# Patient Record
Sex: Female | Born: 1980 | Race: White | Hispanic: No | Smoking: Former smoker
Health system: Southern US, Community
[De-identification: ages and names within clinical notes are randomized; demographics above are authoritative.]

## PROBLEM LIST (undated history)

## (undated) DIAGNOSIS — K3184 Gastroparesis: Secondary | ICD-10-CM

## (undated) DIAGNOSIS — G709 Myoneural disorder, unspecified: Secondary | ICD-10-CM

## (undated) DIAGNOSIS — F419 Anxiety disorder, unspecified: Secondary | ICD-10-CM

## (undated) DIAGNOSIS — Z800 Family history of malignant neoplasm of digestive organs: Secondary | ICD-10-CM

## (undated) DIAGNOSIS — F32A Depression, unspecified: Secondary | ICD-10-CM

## (undated) DIAGNOSIS — T7840XA Allergy, unspecified, initial encounter: Secondary | ICD-10-CM

## (undated) DIAGNOSIS — E119 Type 2 diabetes mellitus without complications: Secondary | ICD-10-CM

## (undated) DIAGNOSIS — E1140 Type 2 diabetes mellitus with diabetic neuropathy, unspecified: Secondary | ICD-10-CM

## (undated) DIAGNOSIS — D649 Anemia, unspecified: Secondary | ICD-10-CM

## (undated) DIAGNOSIS — K589 Irritable bowel syndrome without diarrhea: Secondary | ICD-10-CM

## (undated) DIAGNOSIS — IMO0002 Reserved for concepts with insufficient information to code with codable children: Secondary | ICD-10-CM

## (undated) DIAGNOSIS — G43909 Migraine, unspecified, not intractable, without status migrainosus: Secondary | ICD-10-CM

## (undated) DIAGNOSIS — D689 Coagulation defect, unspecified: Secondary | ICD-10-CM

## (undated) HISTORY — DX: Coagulation defect, unspecified: D68.9

## (undated) HISTORY — DX: Reserved for concepts with insufficient information to code with codable children: IMO0002

## (undated) HISTORY — DX: Migraine, unspecified, not intractable, without status migrainosus: G43.909

## (undated) HISTORY — DX: Depression, unspecified: F32.A

## (undated) HISTORY — PX: BREAST BIOPSY: SHX20

## (undated) HISTORY — DX: Family history of malignant neoplasm of digestive organs: Z80.0

## (undated) HISTORY — DX: Irritable bowel syndrome, unspecified: K58.9

## (undated) HISTORY — DX: Allergy, unspecified, initial encounter: T78.40XA

## (undated) HISTORY — DX: Gastroparesis: K31.84

## (undated) HISTORY — DX: Myoneural disorder, unspecified: G70.9

---
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
## 1999-12-30 ENCOUNTER — Other Ambulatory Visit: Admission: RE | Admit: 1999-12-30 | Discharge: 1999-12-30 | Payer: Self-pay | Admitting: Obstetrics & Gynecology

## 2001-01-11 ENCOUNTER — Other Ambulatory Visit: Admission: RE | Admit: 2001-01-11 | Discharge: 2001-01-11 | Payer: Self-pay | Admitting: Obstetrics & Gynecology

## 2001-03-01 ENCOUNTER — Encounter (INDEPENDENT_AMBULATORY_CARE_PROVIDER_SITE_OTHER): Payer: Self-pay | Admitting: Specialist

## 2001-03-01 ENCOUNTER — Other Ambulatory Visit: Admission: RE | Admit: 2001-03-01 | Discharge: 2001-03-01 | Payer: Self-pay | Admitting: Obstetrics & Gynecology

## 2001-06-27 ENCOUNTER — Other Ambulatory Visit: Admission: RE | Admit: 2001-06-27 | Discharge: 2001-06-27 | Payer: Self-pay | Admitting: Obstetrics & Gynecology

## 2002-02-20 ENCOUNTER — Other Ambulatory Visit: Admission: RE | Admit: 2002-02-20 | Discharge: 2002-02-20 | Payer: Self-pay | Admitting: Obstetrics & Gynecology

## 2002-08-13 ENCOUNTER — Other Ambulatory Visit: Admission: RE | Admit: 2002-08-13 | Discharge: 2002-08-13 | Payer: Self-pay | Admitting: Obstetrics & Gynecology

## 2003-06-26 ENCOUNTER — Emergency Department (HOSPITAL_COMMUNITY): Admission: EM | Admit: 2003-06-26 | Discharge: 2003-06-26 | Payer: Self-pay | Admitting: Emergency Medicine

## 2004-01-19 ENCOUNTER — Other Ambulatory Visit: Admission: RE | Admit: 2004-01-19 | Discharge: 2004-01-19 | Payer: Self-pay | Admitting: Obstetrics & Gynecology

## 2004-07-14 ENCOUNTER — Ambulatory Visit (HOSPITAL_COMMUNITY): Admission: RE | Admit: 2004-07-14 | Discharge: 2004-07-14 | Payer: Self-pay | Admitting: Family Medicine

## 2005-04-11 ENCOUNTER — Other Ambulatory Visit: Admission: RE | Admit: 2005-04-11 | Discharge: 2005-04-11 | Payer: Self-pay | Admitting: Obstetrics & Gynecology

## 2005-04-12 ENCOUNTER — Other Ambulatory Visit: Admission: RE | Admit: 2005-04-12 | Discharge: 2005-04-12 | Payer: Self-pay | Admitting: Obstetrics & Gynecology

## 2005-09-19 HISTORY — PX: CHOLECYSTECTOMY: SHX55

## 2006-01-15 ENCOUNTER — Encounter: Admission: RE | Admit: 2006-01-15 | Discharge: 2006-01-15 | Payer: Self-pay | Admitting: Specialist

## 2006-07-25 ENCOUNTER — Ambulatory Visit: Payer: Self-pay | Admitting: Gastroenterology

## 2006-08-07 ENCOUNTER — Encounter: Payer: Self-pay | Admitting: Gastroenterology

## 2006-08-07 ENCOUNTER — Ambulatory Visit: Payer: Self-pay | Admitting: Gastroenterology

## 2006-08-08 ENCOUNTER — Inpatient Hospital Stay (HOSPITAL_COMMUNITY): Admission: EM | Admit: 2006-08-08 | Discharge: 2006-08-12 | Payer: Self-pay | Admitting: Emergency Medicine

## 2006-08-11 ENCOUNTER — Encounter (INDEPENDENT_AMBULATORY_CARE_PROVIDER_SITE_OTHER): Payer: Self-pay | Admitting: Specialist

## 2006-08-18 ENCOUNTER — Ambulatory Visit: Payer: Self-pay | Admitting: Gastroenterology

## 2007-02-21 ENCOUNTER — Encounter: Admission: RE | Admit: 2007-02-21 | Discharge: 2007-02-21 | Payer: Self-pay | Admitting: Obstetrics and Gynecology

## 2008-10-02 ENCOUNTER — Ambulatory Visit: Payer: Self-pay | Admitting: Diagnostic Radiology

## 2008-10-02 ENCOUNTER — Emergency Department (HOSPITAL_BASED_OUTPATIENT_CLINIC_OR_DEPARTMENT_OTHER): Admission: EM | Admit: 2008-10-02 | Discharge: 2008-10-02 | Payer: Self-pay | Admitting: Emergency Medicine

## 2010-01-28 ENCOUNTER — Encounter (INDEPENDENT_AMBULATORY_CARE_PROVIDER_SITE_OTHER): Payer: Self-pay | Admitting: *Deleted

## 2010-03-08 ENCOUNTER — Ambulatory Visit: Payer: Self-pay | Admitting: Gastroenterology

## 2010-03-08 ENCOUNTER — Encounter (INDEPENDENT_AMBULATORY_CARE_PROVIDER_SITE_OTHER): Payer: Self-pay | Admitting: *Deleted

## 2010-03-08 DIAGNOSIS — K5900 Constipation, unspecified: Secondary | ICD-10-CM | POA: Insufficient documentation

## 2010-03-08 DIAGNOSIS — K625 Hemorrhage of anus and rectum: Secondary | ICD-10-CM

## 2010-03-08 DIAGNOSIS — F329 Major depressive disorder, single episode, unspecified: Secondary | ICD-10-CM | POA: Insufficient documentation

## 2010-03-08 HISTORY — DX: Hemorrhage of anus and rectum: K62.5

## 2010-03-09 LAB — CONVERTED CEMR LAB
ALT: 21 units/L (ref 0–35)
AST: 24 units/L (ref 0–37)
Albumin: 4 g/dL (ref 3.5–5.2)
Alkaline Phosphatase: 85 units/L (ref 39–117)
BUN: 11 mg/dL (ref 6–23)
Basophils Absolute: 0.1 10*3/uL (ref 0.0–0.1)
Basophils Relative: 0.8 % (ref 0.0–3.0)
Bilirubin, Direct: 0.1 mg/dL (ref 0.0–0.3)
CO2: 25 meq/L (ref 19–32)
CRP, High Sensitivity: 6.66 — ABNORMAL HIGH (ref 0.00–5.00)
Calcium: 9.6 mg/dL (ref 8.4–10.5)
Chloride: 109 meq/L (ref 96–112)
Creatinine, Ser: 0.7 mg/dL (ref 0.4–1.2)
Eosinophils Absolute: 0.1 10*3/uL (ref 0.0–0.7)
Eosinophils Relative: 0.7 % (ref 0.0–5.0)
Ferritin: 55.7 ng/mL (ref 10.0–291.0)
Folate: 5 ng/mL
GFR calc non Af Amer: 104.14 mL/min (ref >60)
Glucose, Bld: 175 mg/dL — ABNORMAL HIGH (ref 70–99)
HCT: 38.9 % (ref 36.0–46.0)
Hemoglobin: 13.2 g/dL (ref 12.0–15.0)
Iron: 58 ug/dL (ref 42–145)
Lymphocytes Relative: 33.8 % (ref 12.0–46.0)
Lymphs Abs: 2.4 10*3/uL (ref 0.7–4.0)
MCHC: 33.9 g/dL (ref 30.0–36.0)
MCV: 86.5 fL (ref 78.0–100.0)
Monocytes Absolute: 0.5 10*3/uL (ref 0.1–1.0)
Monocytes Relative: 6.5 % (ref 3.0–12.0)
Neutro Abs: 4.1 10*3/uL (ref 1.4–7.7)
Neutrophils Relative %: 58.2 % (ref 43.0–77.0)
Platelets: 324 10*3/uL (ref 150.0–400.0)
Potassium: 3.8 meq/L (ref 3.5–5.1)
RBC: 4.49 M/uL (ref 3.87–5.11)
RDW: 12.9 % (ref 11.5–14.6)
Saturation Ratios: 13.7 % — ABNORMAL LOW (ref 20.0–50.0)
Sed Rate: 24 mm/hr — ABNORMAL HIGH (ref 0–22)
Sodium: 142 meq/L (ref 135–145)
TSH: 1.92 microintl units/mL (ref 0.35–5.50)
Total Bilirubin: 0.8 mg/dL (ref 0.3–1.2)
Total Protein: 7.6 g/dL (ref 6.0–8.3)
Transferrin: 302.7 mg/dL (ref 212.0–360.0)
Vitamin B-12: 389 pg/mL (ref 211–911)
WBC: 7.1 10*3/uL (ref 4.5–10.5)

## 2010-03-24 ENCOUNTER — Ambulatory Visit: Payer: Self-pay | Admitting: Gastroenterology

## 2010-04-09 ENCOUNTER — Ambulatory Visit: Payer: Self-pay | Admitting: Gastroenterology

## 2010-04-09 DIAGNOSIS — R1032 Left lower quadrant pain: Secondary | ICD-10-CM | POA: Insufficient documentation

## 2010-04-09 HISTORY — DX: Left lower quadrant pain: R10.32

## 2010-04-09 LAB — CONVERTED CEMR LAB
Amylase: 19 units/L — ABNORMAL LOW (ref 27–131)
Basophils Absolute: 0 10*3/uL (ref 0.0–0.1)
Basophils Relative: 0.6 % (ref 0.0–3.0)
Eosinophils Absolute: 0.1 10*3/uL (ref 0.0–0.7)
Eosinophils Relative: 0.7 % (ref 0.0–5.0)
HCT: 40.6 % (ref 36.0–46.0)
Hemoglobin: 14.1 g/dL (ref 12.0–15.0)
IgA: 196 mg/dL (ref 68–378)
Lipase: 21 units/L (ref 11.0–59.0)
Lymphocytes Relative: 30.4 % (ref 12.0–46.0)
Lymphs Abs: 2.4 10*3/uL (ref 0.7–4.0)
MCHC: 34.6 g/dL (ref 30.0–36.0)
MCV: 86.6 fL (ref 78.0–100.0)
Monocytes Absolute: 0.5 10*3/uL (ref 0.1–1.0)
Monocytes Relative: 6.8 % (ref 3.0–12.0)
Neutro Abs: 4.8 10*3/uL (ref 1.4–7.7)
Neutrophils Relative %: 61.5 % (ref 43.0–77.0)
Platelets: 352 10*3/uL (ref 150.0–400.0)
RBC: 4.69 M/uL (ref 3.87–5.11)
RDW: 13.1 % (ref 11.5–14.6)
T3, Free: 3.2 pg/mL (ref 2.3–4.2)
T4, Total: 5.6 ug/dL (ref 5.0–12.5)
TSH: 1.25 microintl units/mL (ref 0.35–5.50)
Tissue Transglutaminase Ab, IgA: 12.3 units (ref <20)
WBC: 7.9 10*3/uL (ref 4.5–10.5)

## 2010-04-22 ENCOUNTER — Ambulatory Visit: Payer: Self-pay | Admitting: Cardiovascular Disease

## 2010-04-30 ENCOUNTER — Telehealth: Payer: Self-pay | Admitting: Gastroenterology

## 2010-05-07 ENCOUNTER — Ambulatory Visit: Payer: Self-pay | Admitting: Gastroenterology

## 2010-05-07 DIAGNOSIS — K219 Gastro-esophageal reflux disease without esophagitis: Secondary | ICD-10-CM | POA: Insufficient documentation

## 2010-06-04 ENCOUNTER — Encounter: Payer: Self-pay | Admitting: Gastroenterology

## 2010-08-25 ENCOUNTER — Other Ambulatory Visit
Admission: RE | Admit: 2010-08-25 | Discharge: 2010-08-25 | Payer: Self-pay | Source: Home / Self Care | Admitting: Obstetrics and Gynecology

## 2010-10-01 ENCOUNTER — Inpatient Hospital Stay (HOSPITAL_COMMUNITY)
Admission: AD | Admit: 2010-10-01 | Discharge: 2010-10-01 | Payer: Self-pay | Source: Home / Self Care | Attending: Obstetrics & Gynecology | Admitting: Obstetrics & Gynecology

## 2010-10-04 LAB — URINALYSIS, ROUTINE W REFLEX MICROSCOPIC
Bilirubin Urine: NEGATIVE
Hgb urine dipstick: NEGATIVE
Ketones, ur: NEGATIVE mg/dL
Nitrite: NEGATIVE
Protein, ur: NEGATIVE mg/dL
Specific Gravity, Urine: >1.03 — ABNORMAL HIGH (ref 1.005–1.030)
Urine Glucose, Fasting: NEGATIVE mg/dL
Urobilinogen, UA: 1 mg/dL (ref 0.0–1.0)
pH: 6 (ref 5.0–8.0)

## 2010-10-10 ENCOUNTER — Encounter: Payer: Self-pay | Admitting: Gastroenterology

## 2010-10-19 NOTE — Progress Notes (Signed)
Summary: rx and ote  Phone Note Call from Patient   Caller: Patient Call For: Dr. Jarold Motto Reason for Call: Talk to Nurse Summary of Call: 1. needs Tramadol rx called into [facility] in [place] 2. needs office note for school explaining recent absences Initial call taken by: Vallarie Mare,  April 30, 2010 9:56 AM  Follow-up for Phone Call        Lm for pt to call.  Lupita Leash Surface RN  April 30, 2010 10:41 AM  No answer pt's home number.   Rx sent for tramaldol as requested Note printed.   Lupita Leash Surface RN  April 30, 2010 2:27 PM  talked with pt.  SHe needs note to be faxed.  She will have to call back with fax number. Follow-up by: Ashok Cordia RN,  April 30, 2010 2:34 PM  Additional Follow-up for Phone Call Additional follow up Details #1::        Fax # [phone].  Faxed as requested.  Pt notified. Additional Follow-up by: Ashok Cordia RN,  April 30, 2010 4:32 PM    Prescriptions: TRAMADOL HCL 50 MG TABS (TRAMADOL HCL) 1 by mouth q 6-8 hrs as needed pain  #60 x 1   Entered by:   Ashok Cordia RN   Authorized by:   Mardella Layman MD [facility]   Signed by:   Ashok Cordia RN on 04/30/2010   Method used:   Electronically to        [facility] [phone]* (retail)       [address] [place], [place]  [address]       Ph: [phone] or [phone]       Fax: [phone]   RxID:   [id number]

## 2010-10-19 NOTE — Assessment & Plan Note (Signed)
Summary: F/U APPT...LSW.   History of Present Illness Visit Type: Follow-up Visit Primary GI MD: Sheryn Bison MD FACP FAGA Primary Provider: Joette Catching, MD Requesting Provider: na Chief Complaint: F/u constipation, rectal bleeding and left side abd pain. Pt states that she is feeling better and the Amitiza is helping but she is having GERD symptoms.  History of Present Illness:   [name] is improved in terms of her constipation and abdominal pain, she is on b.i.d. Amitiza 24 micrograms and also p.r.n. Levsin. CT Scan labs were reviewed and are all normal.   GI Review of Systems    Reports acid reflux and  heartburn.      Denies abdominal pain, belching, bloating, chest pain, dysphagia with liquids, dysphagia with solids, loss of appetite, nausea, vomiting, vomiting blood, weight loss, and  weight gain.        Denies anal fissure, black tarry stools, change in bowel habit, constipation, diarrhea, diverticulosis, fecal incontinence, heme positive stool, hemorrhoids, irritable bowel syndrome, jaundice, light color stool, liver problems, rectal bleeding, and  rectal pain.    Current Medications (verified): 1)  Zoloft 50 Mg Tabs (Sertraline Hcl) .... Take 1 Tablet By Mouth Once Daily 2)  Hyomax-Dt 0.375 Mg Cr-Tabs (Hyoscyamine Sulfate) .... Take 1 Talbet By Mouth Two Times A Day As Needed 3)  Canasa 1000 Mg  Supp (Mesalamine) .Marland Kitchen.. 1 Q Hs 4)  Analpram-Hc 1-1 %  Crea (Hydrocortisone Ace-Pramoxine) .... Use As Needed 5)  Amitiza 24 Mcg Caps (Lubiprostone) .Marland Kitchen.. 1 By Mouth Two Times A Day 6)  Tandem 162-115.2 Mg Caps (Ferrous Fum-Iron Polysacch) .Marland Kitchen.. 1 By Mouth Qd 7)  Folic Acid 1 Mg Tabs (Folic Acid) .Marland Kitchen.. 1 By Mouth Qd 8)  Tramadol Hcl 50 Mg Tabs (Tramadol Hcl) .Marland Kitchen.. 1 By Mouth Q 6-8 Hrs As Needed Pain 9)  Miralax  Powd (Polyethylene Glycol 3350) .... Take As Directed At Bedtime.  Allergies (verified): 1)  ! Sulfa 2)  ! Celebrex  Past History:  Past medical, surgical, family and  social histories (including risk factors) reviewed for relevance to current acute and chronic problems.  Past Medical History: Reviewed history from 03/08/2010 and no changes required. Asthma Kidney Stones Depression  Past Surgical History: Reviewed history from 03/08/2010 and no changes required. Tonsillectomy Cholecystectomy  Family History: Reviewed history from 03/08/2010 and no changes required. Family History of Colon Cancer: grandparents Family History of Irritable Bowel Syndrome:GM  Social History: Reviewed history from 03/08/2010 and no changes required. Occupation: [employer] Patient currently smokes.  Alcohol Use - no Daily Caffeine Use Illicit Drug Use - no Patient gets regular exercise.  Review of Systems  The patient denies allergy/sinus, anemia, anxiety-new, arthritis/joint pain, back pain, blood in urine, breast changes/lumps, change in vision, confusion, cough, coughing up blood, depression-new, fainting, fatigue, fever, headaches-new, hearing problems, heart murmur, heart rhythm changes, itching, menstrual pain, muscle pains/cramps, night sweats, nosebleeds, pregnancy symptoms, shortness of breath, skin rash, sleeping problems, sore throat, swelling of feet/legs, swollen lymph glands, thirst - excessive , urination - excessive , urination changes/pain, urine leakage, vision changes, and voice change.    Vital Signs:  Patient profile:   30 year old female Height:      62 inches Weight:      155 pounds BMI:     28.45 BSA:     1.72 Pulse rate:   68 / minute Pulse rhythm:   regular BP sitting:   120 / 74  (left arm) Cuff size:   regular  Vitals  Entered By: Ok Anis CMA (May 07, 2010 9:44 AM)  Physical Exam  General:  Well developed, well nourished, no acute distress.healthy appearing.  healthy appearing.   Head:  Normocephalic and atraumatic. Eyes:  PERRLA, no icterus.exam deferred to patient's ophthalmologist.  exam deferred to patient's  ophthalmologist.   Abdomen:  Soft, nontender and nondistended. No masses, hepatosplenomegaly or hernias noted. Normal bowel sounds.Minimal tenderness over mobile sigmoid colon in the left lower quadrant. Extremities:  No clubbing, cyanosis, edema or deformities noted. Neurologic:  Alert and  oriented x4;  grossly normal neurologically. Psych:  Alert and cooperative. Normal mood and affect.   Impression & Recommendations:  Problem # 1:  ABDOMINAL PAIN, LEFT LOWER QUADRANT (ICD-789.04) Assessment Improved Constipation predominant IBS doing better on therapy. Continue meds as listed above.  Problem # 2:  GERD (ICD-530.81) Assessment: Deteriorated New onset typical GERD probably related to constipation and anticholinergic medication use. I placed her on AcipHex 20 mg a day along with standard antireflux maneuvers.  Patient Instructions: 1)  Please continue current medications.  2)  Begin Aciphex once daily.  Samples given. 3)  Please schedule a follow-up appointment as needed.  4)  The medication list was reviewed and reconciled.  All changed / newly prescribed medications were explained.  A complete medication list was provided to the patient / caregiver. 5)  Copy sent to : Dr. Joette Catching. 6)  Please continue current medications.  7)  Avoid foods high in acid content ( tomatoes, citrus juices, spicy foods) . Avoid eating within 3 to 4 hours of lying down or before exercising. Do not over eat; try smaller more frequent meals. Elevate head of bed four inches when sleeping.  8)  High Fiber, Low Fat  Healthy Eating Plan brochure given.   Appended Document: F/U APPT...LSW.    Clinical Lists Changes  Medications: Added new medication of ACIPHEX 20 MG  TBEC (RABEPRAZOLE SODIUM) Take 1 each day 30 minutes before meals - Signed Rx of ACIPHEX 20 MG  TBEC (RABEPRAZOLE SODIUM) Take 1 each day 30 minutes before meals;  #30 x 6;  Signed;  Entered by: Ashok Cordia RN;  Authorized by: Mardella Layman MD [facility];  Method used: Electronically to [facility] [phone]*, [address] [place], [place], [place]  [address], Ph: [phone] or [phone], Fax: [phone]    Prescriptions: ACIPHEX 20 MG  TBEC (RABEPRAZOLE SODIUM) Take 1 each day 30 minutes before meals  #30 x 6   Entered by:   Ashok Cordia RN   Authorized by:   Mardella Layman MD [facility]   Signed by:   Ashok Cordia RN on 05/07/2010   Method used:   Electronically to        [facility] [phone]* (retail)       [address] [place], [place]  [address]       Ph: [phone] or [phone]       Fax: [phone]   RxID:   [id number]

## 2010-10-19 NOTE — Procedures (Signed)
Summary: EGD   EGD  Procedure date:  08/07/2006  Findings:      Location: [facility]   Patient Name: [name], [name] MRN:  Procedure Procedures: Panendoscopy (EGD) CPT: 43235.  Personnel: Endoscopist: Vania Rea. Jarold Motto, MD.  Exam Location: Exam performed in Outpatient Clinic. Outpatient  Patient Consent: Procedure, Alternatives, Risks and Benefits discussed, consent obtained, from patient. Consent was obtained by the RN.  Indications Symptoms: Early satiety. Nausea. Dyspepsia, location: epigastric. Reflux symptoms  Comments: Prior Prevpac Rx. and now on PPI Rx. History  Current Medications: Patient is not currently taking Coumadin.  Pre-Exam Physical: Performed Aug 07, 2006  Cardio-pulmonary exam, Abdominal exam, Extremity exam, Mental status exam WNL.  Comments: Pt. history reviewed/updated, physical exam performed prior to initiation of sedation?yes Exam Exam Info: Maximum depth of insertion Duodenum, intended Duodenum. Patient position: on left side. Duration of exam: 10 minutes. Vocal cords visualized. Gastric retroflexion performed. Images taken. ASA Classification: I. Tolerance: excellent.  Sedation Meds: Patient assessed and found to be appropriate for moderate (conscious) sedation. Cetacaine Spray 2 sprays given aerosolized. Versed 3 mg. given IV.  Monitoring: BP and pulse monitoring done. Oximetry used. Supplemental O2 given at 2 Liters.  Instrument(s): GIF 160. Serial [id number].   Findings - Normal: Proximal Esophagus to Distal Esophagus. Not Seen: Tumor. Barrett's esophagus. Esophageal inflammation. Mucosal abnormality. Foreign body. Stricture. Varices.  - Normal: Cardia to Antrum. Tumor. Ulcer. Mucosal abnormality. AVM's. Foreign body. Polyp. Varices. Biopsy/Normal taken. Comments: CLO Bx. done...Marland Kitchen.  - Normal: Duodenal Bulb to Duodenal 2nd Portion. Not Seen: Ulcer. Mucosal abnormality. AVM's. Foreign body. Polyp.  Biopsy/Normal taken. Comments: R/O celiac disease.Marland KitchenMarland KitchenMarland KitchenProminant papilla noted....   Assessment Normal examination.  Comments: Symptoms c/w GERD...??? element of gastroparesis and a diffuse Gut motility disorder.... Events  Unplanned Intervention: No unplanned interventions were required.  Plans Medication(s): PPI: Lansoprazole/Prevacid 30 mg QAM, starting Aug 07, 2006 for indefinitely.   Disposition: After procedure patient sent to recovery. After recovery patient sent home.  Scheduling: Await pathology to schedule patient. Gastric Emptying Study, to [facility]. Jarold Motto, MD, around Aug 14, 2006.        SP Surgical Pathology - STATUS: Final                                            Perform Date: 19Nov07 00:01  Ordered By: Juanetta Beets        Ordered Date: 16XWR60 45:40  Facility: [facility]                               Department: CPATH  Service Report Text  [facility]   [address] [address]   [place], [place] [address]   Telephone [phone] or [phone] Fax [phone]    REPORT OF SURGICAL PATHOLOGY    Case #: [id number]   Patient Name: [name], [name].   Office Chart Number: [id number]    MRN: [id number]   Pathologist: Sonia Side, MD   DOB/Age 12/20/1980 (Age: 29) Gender: F   Date Taken: 08/07/2006   Date Received: 08/07/2006    FINAL DIAGNOSIS    ***MICROSCOPIC EXAMINATION AND DIAGNOSIS***    SMALL INTESTINE, DUODENUM, BIOPSY: BENIGN SMALL BOWEL AND   DUODENAL MUCOSA. NO ACTIVE INFLAMMATION, GRANULOMAS, PARASITES,   OR VILLOUS ATROPHY IDENTIFIED.  mj   Date Reported: 08/08/2006 Sonia Side, MD   *** Electronically Signed Out By LI ***    Clinical information   R/O celiac disease (jes)    specimen(s) obtained   Small intestine, biopsy    Gross Description   Received in formalin are tan, soft tissue fragments that are   submitted in toto. Number: multiple.   Size: 0.4 to 0.6 cm One block (ML:jes,  08/08/06)    jes/    This report was created from the original endoscopy report, which was reviewed and signed by the above listed endoscopist.

## 2010-10-19 NOTE — Letter (Signed)
Summary: [facility] Instructions  [facility]  [address] [place], [place] [address]   Phone: [phone]  Fax: [phone]       [name]    11-24-1980    MRN: [id number]        Procedure Day Dorna Bloom: Wednesday, 03/24/10     Arrival Time: 10:00       Procedure Time: 11:00     Location of Procedure:                    _X _  [facility] (4th Floor)                        PREPARATION FOR COLONOSCOPY WITH MOVIPREP   Starting 5 days prior to your procedure 03/19/10 do not eat nuts, seeds, popcorn, corn, beans, peas,  salads, or any raw vegetables.  Do not take any fiber supplements (e.g. Metamucil, Citrucel, and Benefiber).  THE DAY BEFORE YOUR PROCEDURE         DATE: 03/23/10   DAY: Tuesday  1.  Drink clear liquids the entire day-NO SOLID FOOD  2.  Do not drink anything colored red or purple.  Avoid juices with pulp.  No orange juice.  3.  Drink at least 64 oz. (8 glasses) of fluid/clear liquids during the day to prevent dehydration and help the prep work efficiently.  CLEAR LIQUIDS INCLUDE: Water Jello Ice Popsicles Tea (sugar ok, no milk/cream) Powdered fruit flavored drinks Coffee (sugar ok, no milk/cream) Gatorade Juice: apple, white grape, white cranberry  Lemonade Clear bullion, consomm, broth Carbonated beverages (any kind) Strained chicken noodle soup Hard Candy                             4.  In the morning, mix first dose of MoviPrep solution:    Empty 1 Pouch A and 1 Pouch B into the disposable container    Add lukewarm drinking water to the top line of the container. Mix to dissolve    Refrigerate (mixed solution should be used within 24 hrs)  5.  Begin drinking the prep at 5:00 p.m. The MoviPrep container is divided by 4 marks.   Every 15 minutes drink the solution down to the next mark (approximately 8 oz) until the full liter is complete.   6.  Follow completed prep with 16 oz of clear liquid of your choice (Nothing  red or purple).  Continue to drink clear liquids until bedtime.  7.  Before going to bed, mix second dose of MoviPrep solution:    Empty 1 Pouch A and 1 Pouch B into the disposable container    Add lukewarm drinking water to the top line of the container. Mix to dissolve    Refrigerate  THE DAY OF YOUR PROCEDURE      DATE: 03/24/10    DAY: Wednesday  Beginning at 6:00 a.m. (5 hours before procedure):         1. Every 15 minutes, drink the solution down to the next mark (approx 8 oz) until the full liter is complete.  2. Follow completed prep with 16 oz. of clear liquid of your choice.    3. You may drink clear liquids until 9:00  (2 HOURS BEFORE PROCEDURE).   MEDICATION INSTRUCTIONS  Unless otherwise instructed, you should take regular prescription medications with a small sip of water   as early as  possible the morning of your procedure.                OTHER INSTRUCTIONS  You will need a responsible adult at least 31 years of age to accompany you and drive you home.   This person must remain in the waiting room during your procedure.  Wear loose fitting clothing that is easily removed.  Leave jewelry and other valuables at home.  However, you may wish to bring a book to read or  an iPod/MP3 player to listen to music as you wait for your procedure to start.  Remove all body piercing jewelry and leave at home.  Total time from sign-in until discharge is approximately 2-3 hours.  You should go home directly after your procedure and rest.  You can resume normal activities the  day after your procedure.  The day of your procedure you should not:   Drive   Make legal decisions   Operate machinery   Drink alcohol   Return to work  You will receive specific instructions about eating, activities and medications before you leave.    The above instructions have been reviewed and explained to me by   _______________________    I fully understand and can  verbalize these instructions _____________________________ Date _________

## 2010-10-19 NOTE — Procedures (Signed)
Summary: Colon   Colonoscopy  Procedure date:  08/04/2006  Findings:      Location:  [facility].   Patient Name: [name], [name] MRN:  Procedure Procedures: Colonoscopy CPT: 2893919622.  Personnel: Endoscopist: Vania Rea. Jarold Motto, MD.  Exam Location: Exam performed in Outpatient Clinic. Outpatient  Patient Consent: Procedure, Alternatives, Risks and Benefits discussed, consent obtained, from patient. Consent was obtained by the RN.  Indications Symptoms: Constipation Patient's stools are infrequent. Patient has small caliber stools. Patient has difficulty evacuating, strains with stool passage. Abdominal pain / bloating.  History  Current Medications: Patient is not currently taking Coumadin.  Pre-Exam Physical: Performed Aug 07, 2006. Cardio-pulmonary exam, Rectal exam, Abdominal exam, Extremity exam, Mental status exam WNL.  Comments: Pt. history reviewed/updated, physical exam performed prior to initiation of sedation? Exam Exam: Extent of exam reached: Terminal Ileum, extent intended: Terminal Ileum.  The cecum was identified by appendiceal orifice and IC valve. Patient position: on left side. Time to Cecum: 00:04:57. Time for Withdrawl: 00:04:30. Colon retroflexion performed. Images taken. ASA Classification: I. Tolerance: excellent.  Monitoring: Pulse and BP monitoring, Oximetry used. Supplemental O2 given. at 2 Liters.  Colon Prep Used Golytely for colon prep. Prep results: fair, adequate exam.  Sedation Meds: Patient assessed and found to be appropriate for moderate (conscious) sedation. Fentanyl 100 mcg. given IV. Versed 9 mg. given IV.  Instrument(s): CF 140L. Serial [id number].  Findings - NORMAL EXAM: Ileum. Not Seen: AVM's. Tumors. Crohn's.  - NORMAL EXAM: Cecum to Rectum. Polyps. AVM's. Colitis. Tumors. Melanosis. Crohn's. Diverticulosis. Hemorrhoids.  - NORMAL EXAM: Sigmoid Colon to Rectum.   Assessment Normal examination.    Events  Unplanned Interventions: No intervention was required.  Plans Medication Plan: Continue current medications. Antibiotics: Amitizia  BID, starting Aug 07, 2006 for indefinitely.   Patient Education: Patient given standard instructions for: Constipation. Disposition: After procedure patient sent to recovery. After recovery patient sent home.  Scheduling/Referral: EGD, to [facility]. Jarold Motto, MD, on Aug 07, 2006.    This report was created from the original endoscopy report, which was reviewed and signed by the above listed endoscopist.

## 2010-10-19 NOTE — Procedures (Signed)
Summary: Colonoscopy  Patient: [name] Note: All result statuses are Final unless otherwise noted.  Tests: (1) Colonoscopy (COL)   COL Colonoscopy           DONE      [facility]     [address]     [place], [place]  [address]           COLONOSCOPY PROCEDURE REPORT           PATIENT:  [name], [name]  MR#:  [id number]     BIRTHDATE:  [birth year], 29 yrs. old  GENDER:  female     ENDOSCOPIST:  Vania Rea. Jarold Motto, MD, [facility]     REF. BY:     PROCEDURE DATE:  03/24/2010     PROCEDURE:  Diagnostic Colonoscopy     ASA CLASS:  Class II     INDICATIONS:  rectal bleeding     MEDICATIONS:   Fentanyl 100 mcg IV, Versed 10 mg IV           DESCRIPTION OF PROCEDURE:   After the risks benefits and     alternatives of the procedure were thoroughly explained, informed     consent was obtained.  Digital rectal exam was performed and     revealed no abnormalities.   The LB PCF-H180AL [id number] endoscope     was introduced through the anus and advanced to the cecum, which     was identified by both the appendix and ileocecal valve, without     limitations.  The quality of the prep was excellent, using     MoviPrep.  The instrument was then slowly withdrawn as the colon     was fully examined.     <<PROCEDUREIMAGES>>           FINDINGS:  No polyps or cancers were seen.  no active bleeding or     blood in c.  This was otherwise a normal examination of the colon.     NO PROCTITIS NOTED.   Retroflexed views in the rectum revealed no     abnormalities.    The scope was then withdrawn from the patient     and the procedure completed.           COMPLICATIONS:  None     ENDOSCOPIC IMPRESSION:     1) No polyps or cancers     2) No active bleeding or blood in c     3) Otherwise normal examination     CHRONIC SEVERE CONSTIPATION.     RECOMMENDATIONS:     1) metamucil or benefiber     2) Out patient follow-up in 2 weeks.     CONTINUE MEDS     REPEAT EXAM:  No        ______________________________     Vania Rea. Jarold Motto, MD, [facility]           CC:  Joette Catching, MD           n.     Rosalie Doctor:   Vania Rea. Kehaulani Fruin at 03/24/2010 11:53 AM           Wilson Singer, [phone]  Note: An exclamation mark (!) indicates a result that was not dispersed into the flowsheet. Document Creation Date: 03/24/2010 11:55 AM _______________________________________________________________________  (1) Order result status: Final Collection or observation date-time: 03/24/2010 11:44 Requested date-time:  Receipt date-time:  Reported date-time:  Referring Physician:   Ordering Physician: Sheryn Bison [phone]) Specimen Source:  Source: Launa Grill  Order Number: [id number] Lab site:

## 2010-10-19 NOTE — Assessment & Plan Note (Signed)
Summary: 2 WEEKS F.U AFTER COL.Marland KitchenMarland KitchenEM   History of Present Illness Visit Type: Follow-up Visit Primary GI MD: Sheryn Bison MD FACP FAGA Primary Provider: Joette Catching, MD Chief Complaint: constipation, rectal bleed, left sided pain x2 weeks History of Present Illness:   Continued attacks of left lower quadrant spasmodic type pain with worsening constipation despite initial response to Amitiza 8 micrograms twice a day. She continued with constipation gas and bloating and occasional bleeding with stool straining. Colonoscopy and labs were all unremarkable. She did have some slight iron deficiency and is on tandem, and uses p.r.n. Levsin without improvement. Other medications include Zoloft and folic acid.   GI Review of Systems    Reports abdominal pain and  bloating.     Location of  Abdominal pain: left side.    Denies acid reflux, belching, chest pain, dysphagia with liquids, dysphagia with solids, heartburn, loss of appetite, nausea, vomiting, vomiting blood, weight loss, and  weight gain.      Reports constipation and  rectal bleeding.     Denies anal fissure, black tarry stools, change in bowel habit, diarrhea, diverticulosis, fecal incontinence, heme positive stool, hemorrhoids, irritable bowel syndrome, jaundice, light color stool, liver problems, and  rectal pain.    Current Medications (verified): 1)  Zoloft 50 Mg Tabs (Sertraline Hcl) .... Take 1 Tablet By Mouth Once Daily 2)  Hyomax-Dt 0.375 Mg Cr-Tabs (Hyoscyamine Sulfate) .... Take 1 Talbet By Mouth Two Times A Day As Needed 3)  Canasa 1000 Mg  Supp (Mesalamine) .Marland Kitchen.. 1 Q Hs 4)  Analpram-Hc 1-1 %  Crea (Hydrocortisone Ace-Pramoxine) .... Use As Needed 5)  Amitiza 8 Mcg  Caps (Lubiprostone) .Marland Kitchen.. 1 Two Times A Day/take With Food and Water 6)  Tandem 162-115.2 Mg Caps (Ferrous Fum-Iron Polysacch) .Marland Kitchen.. 1 By Mouth Qd 7)  Folic Acid 1 Mg Tabs (Folic Acid) .Marland Kitchen.. 1 By Mouth Qd  Allergies (verified): 1)  ! Sulfa 2)  !  Celebrex  Past History:  Past medical, surgical, family and social histories (including risk factors) reviewed for relevance to current acute and chronic problems.  Past Medical History: Reviewed history from 03/08/2010 and no changes required. Asthma Kidney Stones Depression  Past Surgical History: Reviewed history from 03/08/2010 and no changes required. Tonsillectomy Cholecystectomy  Family History: Reviewed history from 03/08/2010 and no changes required. Family History of Colon Cancer: grandparents Family History of Irritable Bowel Syndrome:GM  Social History: Reviewed history from 03/08/2010 and no changes required. Occupation: [employer] Patient currently smokes.  Alcohol Use - no Daily Caffeine Use Illicit Drug Use - no Patient gets regular exercise.  Review of Systems       The patient complains of allergy/sinus, anxiety-new, cough, menstrual pain, and swelling of feet/legs.  The patient denies anemia, arthritis/joint pain, back pain, blood in urine, breast changes/lumps, change in vision, confusion, coughing up blood, depression-new, fainting, fatigue, fever, headaches-new, hearing problems, heart murmur, heart rhythm changes, itching, muscle pains/cramps, night sweats, nosebleeds, pregnancy symptoms, shortness of breath, skin rash, sleeping problems, sore throat, swollen lymph glands, thirst - excessive, urination - excessive, urination changes/pain, urine leakage, vision changes, and voice change.         she has regular menstrual periods, has a history of chronic depression, recurrent kidney stones, status post cholecystectomy.  Vital Signs:  Patient profile:   30 year old female Height:      62 inches Weight:      156 pounds BMI:     28.64 Pulse rate:   60 /  minute Pulse rhythm:   regular BP sitting:   110 / 76  (left arm) Cuff size:   regular  Vitals Entered By: June McMurray CMA Duncan Dull) (April 09, 2010 10:22 AM)  Physical Exam  General:  Well developed,  well nourished, no acute distress.healthy appearing.   Head:  Normocephalic and atraumatic. Eyes:  PERRLA, no icterus.exam deferred to patient's ophthalmologist.   Abdomen:  Soft, nontender and nondistended. No masses, hepatosplenomegaly or hernias noted. Normal bowel sounds.No distention or tenderness or abnormal bowel sounds noted. Rectal:  Normal exam.firm hard stool in the rectal vault which is of normal color and guaiac negative. Cannot appreciate any fissures or fistulae. Extremities:  No clubbing, cyanosis, edema or deformities noted. Neurologic:  Alert and  oriented x4;  grossly normal neurologically. Psych:  Alert and cooperative. Normal mood and affect.   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 I'm concerned about her continued abdominal pain although review of her record shows that this had been present for several years. She relates that she recently had pelvic exam and pelvic ultrasound per her gynecologist and this was all normal. Her mother also has severe IBS. I have increased her Amitiza to 24 micrograms twice a day, aspirin to take MiraLax regularly 8 ounces at bedtime, avoid anticholinergics, and to use p.r.n. tramadol 50 mg for pain. CT Scan of the abdomen and pelvis also has been ordered along with further laboratory evaluation. Orders: TLB-Amylase (82150-AMYL) TLB-CBC Platelet - w/Differential (85025-CBCD) TLB-Lipase (83690-LIPASE) TLB-T3, Free (Triiodothyronine) (84481-T3FREE) TLB-T4 (Thyrox), Total [phone]) TLB-TSH (Thyroid Stimulating Hormone) (84443-TSH) TLB-IgA (Immunoglobulin A) (82784-IGA) T-Sprue Panel (Celiac Disease Aby Eval) (83516x3/[address])  Problem # 2:  DEPRESSION (ICD-311) Assessment: Improved Continue Zoloft 50 mg a day as tolerated.  Problem # 3:  HEMORRHAGE OF RECTUM AND ANUS (ICD-569.3) Assessment: Improved Superficial anterior  anal fissure seen on last exam has healed. The patient was found to have iron and folate deficiency and is on replacement therapy appropriately.  Patient Instructions: 1)  Increase amitiza to 24 micrograms two times a day. 2)  Begin Miralax at bedtime. 3)  Begin Tramaldol as needed for pain.  A prescription will be sent to you pharmacy. 4)  You are scheduled for a Ct scan. 5)  The medication list was reviewed and reconciled.  All changed / newly prescribed medications were explained.  A complete medication list was provided to the patient / caregiver. 6)  Copy sent to : Dr. Joette Catching. 7)  Please schedule a follow-up appointment in 3 weeks.  Prescriptions: TRAMADOL HCL 50 MG TABS (TRAMADOL HCL) 1 by mouth q 6-8 hrs as needed pain  #60 x 3   Entered by:   Ashok Cordia RN   Authorized by:   Mardella Layman MD [facility]   Signed by:   Mardella Layman MD [facility] on 04/09/2010   Method used:   Historical   RxID:   [id number] AMITIZA 24 MCG CAPS (LUBIPROSTONE) 1 by mouth two times a day  #60 x 0   Entered by:   Ashok Cordia RN   Authorized by:   Mardella Layman MD [facility]   Signed by:   Mardella Layman MD FACG on 04/09/2010   Method used:   Historical   RxID:   [id number]   Appended Document: 2 WEEKS F.U AFTER COL.Marland KitchenMarland KitchenEM    Clinical Lists Changes  Orders: Added new Referral order of CT Abdomen/Pelvis with Contrast (CT  Abd/Pelvis w/con) - Signed

## 2010-10-19 NOTE — Miscellaneous (Signed)
Summary: Change PPIs  Patients insurance company will not cover Aciphex but will cover Nexium, Protonix, and Omeprazole. Since I can not see that she has taken any of the covered PPIs I have sent in Nexium.     Clinical Lists Changes  Medications: Changed medication from ACIPHEX 20 MG  TBEC (RABEPRAZOLE SODIUM) Take 1 each day 30 minutes before meals to NEXIUM 40 MG CPDR (ESOMEPRAZOLE MAGNESIUM) take one by mouth once daily - Signed Rx of NEXIUM 40 MG CPDR (ESOMEPRAZOLE MAGNESIUM) take one by mouth once daily;  #30 x 6;  Signed;  Entered by: Harlow Mares CMA (AAMA);  Authorized by: Mardella Layman MD [facility];  Method used: Electronically to [facility] [phone]*, [address] [place], [place], [place]  [address], Ph: [phone] or [phone], Fax: [phone]    Prescriptions: NEXIUM 40 MG CPDR (ESOMEPRAZOLE MAGNESIUM) take one by mouth once daily  #30 x 6   Entered by:   Harlow Mares CMA (AAMA)   Authorized by:   Mardella Layman MD [facility]   Signed by:   Harlow Mares CMA (AAMA) on 06/04/2010   Method used:   Electronically to        [facility] [phone]* (retail)       [address] [place], [place]  [address]       Ph: [phone] or [phone]       Fax: [phone]   RxID:   [id number]

## 2010-10-19 NOTE — Letter (Signed)
Summary: New Patient letter  [facility]  [address] [place], [place] [address]   Phone: [phone]  Fax: [phone]       01/28/2010 MRN: [id number]  [name] [address] [place], [place]  [address]  Dear [name],  Welcome to the Gastroenterology Division at [facility].    You are scheduled to see Dr.  Jarold Motto on 03/08/2010 at  10:00am on the 3rd floor at [facility], [address]  We ask that you try to arrive at our office 15 minutes prior to your appointment time to allow for check-in.  We would like you to complete the enclosed self-administered evaluation form prior to your visit and bring it with you on the day of your appointment.  We will review it with you.  Also, please bring a complete list of all your medications or, if you prefer, bring the medication bottles and we will list them.  Please bring your insurance card so that we may make a copy of it.  If your insurance requires a referral to see a specialist, please bring your referral form from your primary care physician.  Co-payments are due at the time of your visit and may be paid by cash, check or credit card.     Your office visit will consist of a consult with your physician (includes a physical exam), any laboratory testing he/she may order, scheduling of any necessary diagnostic testing (e.g. x-ray, ultrasound, CT-scan), and scheduling of a procedure (e.g. Endoscopy, Colonoscopy) if required.  Please allow enough time on your schedule to allow for any/all of these possibilities.    If you cannot keep your appointment, please call [phone] to cancel or reschedule prior to your appointment date.  This allows [place] the opportunity to schedule an appointment for another patient in need of care.  If you do not cancel or reschedule by 5 p.m. the business day prior to your appointment date, you will be charged a $50.00 late cancellation/no-show fee.    Thank you for choosing  [facility] for your medical needs.  We appreciate the opportunity to care for you.  Please visit [place] at our website  to learn more about our practice.                     Sincerely,                                                             The Gastroenterology Division

## 2010-10-19 NOTE — Assessment & Plan Note (Signed)
Summary: constipation, blood in stool/lk   History of Present Illness Visit Type: Initial Visit Primary GI MD: Sheryn Bison MD FACP FAGA Primary Provider: Joette Catching, MD Chief Complaint: Constipation, rectal bleeding x 6 weeks (BRB) History of Present Illness:   30 year old Caucasian female who has had rectal pain and rectal bleeding persistently for the last 6 weeks. Associated with this problem is rather severe progressive constipation refractory to all treatment to the point where she takes up to 6-8 Dulcolax after 4-5 days without a bowel movement. At that time she has abdominal discomfort, gas and bloating. She was previously seen 4 years ago and had a negative colonoscopy. She subsequently underwent laparoscopic cholecystectomy for gallbladder dyskinesia. She also underwent endoscopy which was unremarkable and small bowel biopsy was normal. She is not completely therapy at this time. She relates that  fiber makes her symptomatology worse. She allegedly drinks adequate fluids daily. She does not use narcotics or any cholinergics although she recently saw her GYN physician and was placed on Levsin for  t.left lower quadrant discomfort, and has had improvement. Her rectal bleeding is fresh and occasionally mixed in her stool. She denies symptoms of anemia. She menstruates normally and has had a recent period. She is on Zoloft 50 mg a day for chronic depression. There is no history of any known food intolerances, she does not abuse alcohol or cigarettes. She raises completed Suprex antibiotic therapy. Her paternal grandfather had colon cancer at an elderly age.   GI Review of Systems    Reports abdominal pain, acid reflux, belching, and  bloating.     Location of  Abdominal pain: LLQ.    Denies chest pain, dysphagia with liquids, dysphagia with solids, heartburn, loss of appetite, nausea, vomiting, vomiting blood, weight loss, and  weight gain.      Reports change in bowel habits,  constipation, diarrhea, hemorrhoids, rectal bleeding, and  rectal pain.     Denies anal fissure, black tarry stools, diverticulosis, fecal incontinence, heme positive stool, irritable bowel syndrome, jaundice, light color stool, and  liver problems. Preventive Screening-Counseling & Management  Alcohol-Tobacco     Smoking Status: current  Caffeine-Diet-Exercise     Does Patient Exercise: yes      Drug Use:  no.      Current Medications (verified): 1)  Zoloft 50 Mg Tabs (Sertraline Hcl) .... Take 1 Tablet By Mouth Once Daily 2)  Hyomax-Dt 0.375 Mg Cr-Tabs (Hyoscyamine Sulfate) .... Take 1 Talbet By Mouth Two Times A Day As Needed  Allergies (verified): 1)  ! Sulfa 2)  ! Celebrex  Past History:  Past medical, surgical, family and social histories (including risk factors) reviewed for relevance to current acute and chronic problems.  Past Medical History: Asthma Kidney Stones Depression  Past Surgical History: Tonsillectomy Cholecystectomy  Family History: Reviewed history from 03/03/2010 and no changes required. Family History of Colon Cancer: grandparents Family History of Irritable Bowel Syndrome:GM  Social History: Reviewed history and no changes required. Occupation: [employer] Patient currently smokes.  Alcohol Use - no Daily Caffeine Use Illicit Drug Use - no Patient gets regular exercise. Smoking Status:  current Drug Use:  no Does Patient Exercise:  yes  Review of Systems       The patient complains of anxiety-new, depression-new, fatigue, and headaches-new.  The patient denies allergy/sinus, anemia, arthritis/joint pain, back pain, blood in urine, breast changes/lumps, change in vision, confusion, cough, coughing up blood, fainting, fever, hearing problems, heart murmur, heart rhythm changes, itching, menstrual pain,  muscle pains/cramps, night sweats, nosebleeds, pregnancy symptoms, shortness of breath, skin rash, sleeping problems, sore throat, swelling of  feet/legs, swollen lymph glands, thirst - excessive , urination - excessive , urination changes/pain, urine leakage, vision changes, and voice change.    Vital Signs:  Patient profile:   30 year old female Height:      62 inches Weight:      154.38 pounds BMI:     28.34 Pulse rate:   64 / minute Pulse rhythm:   regular BP sitting:   110 / 70  (left arm) Cuff size:   regular  Vitals Entered By: June McMurray CMA Duncan Dull) (March 08, 2010 10:05 AM)  Physical Exam  General:  Well developed, well nourished, no acute distress.healthy appearing.   Head:  Normocephalic and atraumatic. Eyes:  PERRLA, no icterus.exam deferred to patient's ophthalmologist.   Neck:  Supple; no masses or thyromegaly. Lungs:  Clear throughout to auscultation. Heart:  Regular rate and rhythm; no murmurs, rubs,  or bruits. Abdomen:  Soft, nontender and nondistended. No masses, hepatosplenomegaly or hernias noted. Normal bowel sounds. Rectal:  Normal exam.superficial anterior fissure noted without fistulae, hemorrhoids, masses or tenderness. There is soft stool present which is markedly guaiac positive. Msk:  Symmetrical with no gross deformities. Normal posture. Pulses:  Normal pulses noted. Extremities:  No clubbing, cyanosis, edema or deformities noted. Neurologic:  Alert and  oriented x4;  grossly normal neurologically. Skin:  Intact without significant lesions or rashes. Cervical Nodes:  No significant cervical adenopathy. Psych:  Alert and cooperative. Normal mood and affect.   Impression & Recommendations:  Problem # 1:  HEMORRHAGE OF RECTUM AND ANUS (ICD-569.3) Assessment Deteriorated There is a superficial fissure present related to her chronic constipation and straining. I doubt that she has anorectal Crohn's disease. Labs have been ordered along with followup colonoscopy exam. We have started Canasa 1 g suppositories at night with frequent local anal mantle cream with Xylocaine. Also samples of Amitiza 8  micrograms twice a day supply. She may need sits scratching atSitz marker study to assess the possibility of colonic inertia. I have asked to discontinue Levsin if possible. Orders: TLB-CBC Platelet - w/Differential (85025-CBCD) TLB-BMP (Basic Metabolic Panel-BMET) (80048-METABOL) TLB-Hepatic/Liver Function Pnl (80076-HEPATIC) TLB-TSH (Thyroid Stimulating Hormone) (84443-TSH) TLB-B12, Serum-Total ONLY (16109-U04) TLB-Ferritin (82728-FER) TLB-Folic Acid (Folate) (82746-FOL) TLB-IBC Pnl (Iron/FE;Transferrin) (83550-IBC) TLB-CRP-High Sensitivity (C-Reactive Protein) (86140-FCRP) TLB-Sedimentation Rate (ESR) (85652-ESR)  Problem # 2:  CONSTIPATION (ICD-564.00) Assessment: Deteriorated  Orders: TLB-CBC Platelet - w/Differential (85025-CBCD) TLB-BMP (Basic Metabolic Panel-BMET) (80048-METABOL) TLB-Hepatic/Liver Function Pnl (80076-HEPATIC) TLB-TSH (Thyroid Stimulating Hormone) (84443-TSH) TLB-B12, Serum-Total ONLY (54098-J19) TLB-Ferritin (82728-FER) TLB-Folic Acid (Folate) (82746-FOL) TLB-IBC Pnl (Iron/FE;Transferrin) (83550-IBC) TLB-CRP-High Sensitivity (C-Reactive Protein) (86140-FCRP) TLB-Sedimentation Rate (ESR) (85652-ESR)  Problem # 3:  DEPRESSION (ICD-311) Assessment: Unchanged Continue Zoloft 50 mg a day as tolerated.  Patient Instructions: 1)  Please go th the basement for lab work. 2)  You are scheduled for a colonoscopy. 3)  Begin Amitiza 8 mg two times a day. 4)  Begin Canasa supppositories at bed time. 5)  Use analmantal cream as needed. 6)  The medication list was reviewed and reconciled.  All changed / newly prescribed medications were explained.  A complete medication list was provided to the patient / caregiver. 7)  Copy sent to : Dr. Joette Catching 8)  Please continue current medications.  9)  Colonoscopy and Flexible Sigmoidoscopy brochure given.  10)  Conscious Sedation brochure given.  11)  Constipation and Hemorrhoids brochure given.   Appended Document:  constipation, blood in stool/lk    Clinical Lists Changes  Medications: Added new medication of MOVIPREP 100 GM  SOLR (PEG-KCL-NACL-NASULF-NA ASC-C) As per prep instructions. - Signed Added new medication of CANASA 1000 MG  SUPP (MESALAMINE) 1 q hs - Signed Added new medication of ANALPRAM-HC 1-1 %  CREA (HYDROCORTISONE ACE-PRAMOXINE) Use as needed - Signed Added new medication of AMITIZA 8 MCG  CAPS (LUBIPROSTONE) 1 two times a day/take with food and water - Signed Rx of MOVIPREP 100 GM  SOLR (PEG-KCL-NACL-NASULF-NA ASC-C) As per prep instructions.;  #1 x 0;  Signed;  Entered by: Ashok Cordia RN;  Authorized by: Mardella Layman MD [facility];  Method used: Electronically to [facility] [phone]*, [address], [place], [place], [place]  [address], Ph: [phone] or [phone], Fax: [phone] Rx of CANASA 1000 MG  SUPP (MESALAMINE) 1 q hs;  #30 x 3;  Signed;  Entered by: Ashok Cordia RN;  Authorized by: Mardella Layman MD [facility];  Method used: Electronically to [facility] [phone]*, [address], [place], [place], [place]  [address], Ph: [phone] or [phone], Fax: [phone] Rx of ANALPRAM-HC 1-1 %  CREA (HYDROCORTISONE ACE-PRAMOXINE) Use as needed;  #45 gm x 3;  Signed;  Entered by: Ashok Cordia RN;  Authorized by: Mardella Layman MD [facility];  Method used: Electronically to [facility] [phone]*, [address], [place], [place], [place]  [address], Ph: [phone] or [phone], Fax: [phone] Rx of AMITIZA 8 MCG  CAPS (LUBIPROSTONE) 1 two times a day/take with food and water;  #60 x 6;  Signed;  Entered by: Ashok Cordia RN;  Authorized by: Mardella Layman MD [facility];  Method used: Electronically to [facility] [phone]*, [address], [place], [place], [place]  [address], Ph: [phone] or [phone], Fax: [phone] Orders: Added new Test order of Colonoscopy (Colon) - Signed    Prescriptions: AMITIZA 8 MCG  CAPS  (LUBIPROSTONE) 1 two times a day/take with food and water  #60 x 6   Entered by:   Ashok Cordia RN   Authorized by:   Mardella Layman MD [facility]   Signed by:   Ashok Cordia RN on 03/08/2010   Method used:   Electronically to        [facility] #[id number]* (retail)       [address] [place], [place]  [address]       Ph: [phone] or [phone]       Fax: [phone]   RxID:   [id number] ANALPRAM-HC 1-1 %  CREA (HYDROCORTISONE ACE-PRAMOXINE) Use as needed  #45 gm x 3   Entered by:   Ashok Cordia RN   Authorized by:   Mardella Layman MD [facility]   Signed by:   Ashok Cordia RN on 03/08/2010   Method used:   Electronically to        [facility] #[id number]* (retail)       [address] [place], [place]  [address]       Ph: [phone] or [phone]       Fax: [phone]   RxID:   [id number] CANASA 1000 MG  SUPP (MESALAMINE) 1 q hs  #30 x 3   Entered by:   Ashok Cordia RN  Authorized by:   Mardella Layman MD [facility]   Signed by:   Ashok Cordia RN on 03/08/2010   Method used:   Electronically to        [facility] [phone]* (retail)       [address] [place], [place]  [address]       Ph: [phone] or [phone]       Fax: [phone]   RxID:   [id number] MOVIPREP 100 GM  SOLR (PEG-KCL-NACL-NASULF-NA ASC-C) As per prep instructions.  #1 x 0   Entered by:   Ashok Cordia RN   Authorized by:   Mardella Layman MD [facility]   Signed by:   Ashok Cordia RN on 03/08/2010   Method used:   Electronically to        [facility] [phone]* (retail)       [address] [place], [place]  [address]       Ph: [phone] or [phone]       Fax: [phone]   RxID:   [id number]

## 2010-11-24 ENCOUNTER — Inpatient Hospital Stay (HOSPITAL_COMMUNITY)
Admission: AD | Admit: 2010-11-24 | Discharge: 2010-11-24 | Disposition: A | Discharge status: Home / Self Care | Payer: Medicaid Other | Source: Ambulatory Visit | Attending: Obstetrics & Gynecology | Admitting: Obstetrics & Gynecology

## 2010-11-24 DIAGNOSIS — O99891 Other specified diseases and conditions complicating pregnancy: Secondary | ICD-10-CM | POA: Insufficient documentation

## 2010-11-24 DIAGNOSIS — R1013 Epigastric pain: Secondary | ICD-10-CM | POA: Insufficient documentation

## 2010-11-24 DIAGNOSIS — O9989 Other specified diseases and conditions complicating pregnancy, childbirth and the puerperium: Secondary | ICD-10-CM

## 2010-11-24 LAB — URINALYSIS, ROUTINE W REFLEX MICROSCOPIC
Bilirubin Urine: NEGATIVE
Glucose, UA: 100 mg/dL — AB
Hgb urine dipstick: NEGATIVE
Ketones, ur: NEGATIVE mg/dL
Nitrite: NEGATIVE
Protein, ur: NEGATIVE mg/dL
Specific Gravity, Urine: 1.015 (ref 1.005–1.030)
Urobilinogen, UA: 0.2 mg/dL (ref 0.0–1.0)
pH: 6.5 (ref 5.0–8.0)

## 2010-11-24 LAB — COMPREHENSIVE METABOLIC PANEL
ALT: 14 U/L (ref 0–35)
AST: 16 U/L (ref 0–37)
Albumin: 2.7 g/dL — ABNORMAL LOW (ref 3.5–5.2)
Alkaline Phosphatase: 87 U/L (ref 39–117)
BUN: 9 mg/dL (ref 6–23)
CO2: 24 mEq/L (ref 19–32)
Calcium: 9 mg/dL (ref 8.4–10.5)
Chloride: 106 mEq/L (ref 96–112)
Creatinine, Ser: 0.55 mg/dL (ref 0.4–1.2)
GFR calc Af Amer: >60 mL/min (ref >60)
GFR calc non Af Amer: >60 mL/min (ref >60)
Glucose, Bld: 103 mg/dL — ABNORMAL HIGH (ref 70–99)
Potassium: 3.6 mEq/L (ref 3.5–5.1)
Sodium: 134 mEq/L — ABNORMAL LOW (ref 135–145)
Total Bilirubin: 0.5 mg/dL (ref 0.3–1.2)
Total Protein: 6.6 g/dL (ref 6.0–8.3)

## 2010-11-24 LAB — WET PREP, GENITAL
Clue Cells Wet Prep HPF POC: NONE SEEN
Trich, Wet Prep: NONE SEEN
Yeast Wet Prep HPF POC: NONE SEEN

## 2010-11-25 ENCOUNTER — Inpatient Hospital Stay (HOSPITAL_COMMUNITY): Payer: Medicaid Other

## 2010-11-25 ENCOUNTER — Inpatient Hospital Stay (HOSPITAL_COMMUNITY)
Admission: AD | Admit: 2010-11-25 | Discharge: 2010-11-27 | DRG: 781 | Disposition: A | Discharge status: Home / Self Care | Payer: Medicaid Other | Source: Ambulatory Visit | Attending: Obstetrics & Gynecology | Admitting: Obstetrics & Gynecology

## 2010-11-25 DIAGNOSIS — K5900 Constipation, unspecified: Secondary | ICD-10-CM | POA: Diagnosis present

## 2010-11-25 DIAGNOSIS — O99891 Other specified diseases and conditions complicating pregnancy: Principal | ICD-10-CM | POA: Diagnosis present

## 2010-11-25 LAB — URINALYSIS, ROUTINE W REFLEX MICROSCOPIC
Bilirubin Urine: NEGATIVE
Glucose, UA: NEGATIVE mg/dL
Hgb urine dipstick: NEGATIVE
Ketones, ur: 15 mg/dL — AB
Nitrite: NEGATIVE
Protein, ur: NEGATIVE mg/dL
Specific Gravity, Urine: >1.03 — ABNORMAL HIGH (ref 1.005–1.030)
Urobilinogen, UA: 0.2 mg/dL (ref 0.0–1.0)
pH: 6 (ref 5.0–8.0)

## 2010-11-25 LAB — CBC
HCT: 36.4 % (ref 36.0–46.0)
Hemoglobin: 12.1 g/dL (ref 12.0–15.0)
MCH: 29.7 pg (ref 26.0–34.0)
MCHC: 33.2 g/dL (ref 30.0–36.0)
MCV: 89.2 fL (ref 78.0–100.0)
Platelets: 300 10*3/uL (ref 150–400)
RBC: 4.08 MIL/uL (ref 3.87–5.11)
RDW: 13.4 % (ref 11.5–15.5)
WBC: 12.8 10*3/uL — ABNORMAL HIGH (ref 4.0–10.5)

## 2010-11-25 LAB — WET PREP, GENITAL
Clue Cells Wet Prep HPF POC: NONE SEEN
Trich, Wet Prep: NONE SEEN
Yeast Wet Prep HPF POC: NONE SEEN

## 2010-11-25 LAB — MAGNESIUM: Magnesium: 6.7 mg/dL (ref 1.5–2.5)

## 2010-11-25 LAB — CREATININE, SERUM: GFR calc non Af Amer: >60 mL/min (ref >60)

## 2010-11-25 LAB — GC/CHLAMYDIA PROBE AMP, GENITAL
Chlamydia, DNA Probe: NEGATIVE
GC Probe Amp, Genital: NEGATIVE

## 2010-11-26 DIAGNOSIS — K5900 Constipation, unspecified: Secondary | ICD-10-CM

## 2010-11-26 DIAGNOSIS — O9989 Other specified diseases and conditions complicating pregnancy, childbirth and the puerperium: Secondary | ICD-10-CM

## 2010-11-27 LAB — STREP B DNA PROBE: Strep Group B Ag: NEGATIVE

## 2010-12-03 ENCOUNTER — Inpatient Hospital Stay (HOSPITAL_COMMUNITY)
Admission: AD | Admit: 2010-12-03 | Discharge: 2010-12-03 | Disposition: A | Discharge status: Home / Self Care | Payer: Medicaid Other | Source: Ambulatory Visit | Attending: Obstetrics & Gynecology | Admitting: Obstetrics & Gynecology

## 2010-12-03 DIAGNOSIS — O4700 False labor before 37 completed weeks of gestation, unspecified trimester: Secondary | ICD-10-CM

## 2010-12-18 ENCOUNTER — Inpatient Hospital Stay (HOSPITAL_COMMUNITY)
Admission: AD | Admit: 2010-12-18 | Discharge: 2010-12-18 | Disposition: A | Discharge status: Home / Self Care | Payer: Medicaid Other | Source: Ambulatory Visit | Attending: Obstetrics & Gynecology | Admitting: Obstetrics & Gynecology

## 2010-12-18 DIAGNOSIS — O4700 False labor before 37 completed weeks of gestation, unspecified trimester: Secondary | ICD-10-CM | POA: Insufficient documentation

## 2010-12-18 LAB — URINALYSIS, ROUTINE W REFLEX MICROSCOPIC
Glucose, UA: NEGATIVE mg/dL
Hgb urine dipstick: NEGATIVE
Ketones, ur: NEGATIVE mg/dL
Protein, ur: NEGATIVE mg/dL
Urobilinogen, UA: 0.2 mg/dL (ref 0.0–1.0)

## 2010-12-18 LAB — WET PREP, GENITAL: Yeast Wet Prep HPF POC: NONE SEEN

## 2010-12-20 LAB — GC/CHLAMYDIA PROBE AMP, GENITAL
Chlamydia, DNA Probe: NEGATIVE
GC Probe Amp, Genital: NEGATIVE

## 2010-12-22 ENCOUNTER — Encounter: Payer: Medicaid Other | Attending: Obstetrics & Gynecology

## 2010-12-22 DIAGNOSIS — O99810 Abnormal glucose complicating pregnancy: Secondary | ICD-10-CM | POA: Insufficient documentation

## 2010-12-22 DIAGNOSIS — Z713 Dietary counseling and surveillance: Secondary | ICD-10-CM | POA: Insufficient documentation

## 2010-12-31 NOTE — Discharge Summary (Signed)
  NAMEBENNA, [name]              ACCOUNT NO.:  [id number]  MEDICAL RECORD NO.:  [id number]           PATIENT TYPE:  I  LOCATION:  9151                          FACILITY:  [facility]  PHYSICIAN:  Lesly Dukes, M.D. DATE OF BIRTH:  1980-12-08  DATE OF ADMISSION:  11/25/2010 DATE OF DISCHARGE:  11/27/2010                              DISCHARGE SUMMARY   DISCHARGE DIAGNOSIS:  Threat in preterm labor, now resolved.  HOSPITAL COURSE:  The patient is a 30 year old G2, para 0-0-1-0 who was admitted on November 25, 2010, at 25 weeks and 3 days with questionable contractions.  The patient was admitted for tocolysis and betamethasone. The patient's contractions stopped with magnesium sulfate.  She received two doses of betamethasone.  She also received penicillin for GBS prophylaxis.  The magnesium sulfate was stopped.  She was transitioned to Procardia.  Retractions still abated.  She did have significant problem with constipation.  She received enema, which helped with some of her pressure symptoms.  Her cervix is still closed, feels long and her FFN is negative on day of discharge.  The patient is going to be discharged home on November 27, 2010.  DISCHARGE INSTRUCTIONS: 1. Modified bedrest. 2. Strict vaginal rest. 3. Follow up with Dr. Emelda Fear on Monday for repeat cervical exam.  DISCHARGE MEDICATIONS: 1. Prenatal vitamin one tablet p.o. daily. 2. Procardia 30 mg XL one tablet p.o. q.12 h.     Lesly Dukes, M.D.     KHL/MEDQ  D:  11/27/2010  T:  11/28/2010  Job:  [id number]  Electronically Signed by Elsie Lincoln M.D. on 12/31/2010 11:26:44 AM

## 2011-01-03 ENCOUNTER — Inpatient Hospital Stay (HOSPITAL_COMMUNITY)
Admission: AD | Admit: 2011-01-03 | Discharge: 2011-01-03 | Disposition: A | Discharge status: Home / Self Care | Payer: Medicaid Other | Source: Ambulatory Visit | Attending: Obstetrics & Gynecology | Admitting: Obstetrics & Gynecology

## 2011-01-03 DIAGNOSIS — O4700 False labor before 37 completed weeks of gestation, unspecified trimester: Secondary | ICD-10-CM | POA: Insufficient documentation

## 2011-01-03 LAB — URINALYSIS, ROUTINE W REFLEX MICROSCOPIC
Hgb urine dipstick: NEGATIVE
Nitrite: NEGATIVE
Protein, ur: NEGATIVE mg/dL
Specific Gravity, Urine: >1.03 — ABNORMAL HIGH (ref 1.005–1.030)
Urobilinogen, UA: 1 mg/dL (ref 0.0–1.0)

## 2011-01-03 LAB — URINE MICROSCOPIC-ADD ON

## 2011-01-03 LAB — WET PREP, GENITAL: Trich, Wet Prep: NONE SEEN

## 2011-01-04 LAB — URINE CULTURE
Colony Count: NO GROWTH
Culture  Setup Time: 201204161402
Culture: NO GROWTH

## 2011-01-11 ENCOUNTER — Inpatient Hospital Stay (HOSPITAL_COMMUNITY)
Admission: AD | Admit: 2011-01-11 | Discharge: 2011-01-11 | Disposition: A | Discharge status: Home / Self Care | Payer: Medicaid Other | Source: Ambulatory Visit | Attending: Obstetrics & Gynecology | Admitting: Obstetrics & Gynecology

## 2011-01-11 DIAGNOSIS — O4700 False labor before 37 completed weeks of gestation, unspecified trimester: Secondary | ICD-10-CM

## 2011-01-11 DIAGNOSIS — E860 Dehydration: Secondary | ICD-10-CM | POA: Insufficient documentation

## 2011-01-11 LAB — URINALYSIS, ROUTINE W REFLEX MICROSCOPIC
Hgb urine dipstick: NEGATIVE
Ketones, ur: 40 mg/dL — AB
Nitrite: NEGATIVE
Specific Gravity, Urine: >1.03 — ABNORMAL HIGH (ref 1.005–1.030)
Urobilinogen, UA: 2 mg/dL — ABNORMAL HIGH (ref 0.0–1.0)

## 2011-01-11 LAB — URINE MICROSCOPIC-ADD ON

## 2011-01-11 LAB — FETAL FIBRONECTIN: Fetal Fibronectin: NEGATIVE

## 2011-02-04 NOTE — Op Note (Signed)
NAMESLOAN, GALENTINE              ACCOUNT NO.:  [id number]   MEDICAL RECORD NO.:  [id number]          PATIENT TYPE:  INP   LOCATION:  1621                         FACILITY:  [facility]   PHYSICIAN:  Clovis Pu. Cornett, M.D.DATE OF BIRTH:  03-Jun-1981   DATE OF PROCEDURE:  DATE OF DISCHARGE:                                 OPERATIVE REPORT   PREOPERATIVE DIAGNOSIS:  Biliary dyskinesia.   POSTOPERATIVE DIAGNOSIS:  Biliary dyskinesia.   PROCEDURE:  Laparoscopic cholecystectomy with intraoperative cholangiogram.   SURGEON:  Thomas A. Cornett, M.D.   ANESTHESIA:  General endotracheal anesthesia with 0.25% Sensorcaine local.   ESTIMATED BLOOD LOSS:  40 mL.   DRAINS:  None.   SPECIMEN:  Gallbladder to pathology.  No evidence of cholelithiasis.   INDICATIONS FOR PROCEDURE:  The patient is a 30 year old female admitted to  the gastroenterology service due to right upper quadrant abdominal pain.  She has a past history of irritable bowel disease, but was having  significant right upper quadrant pain and tenderness.  HIDA study showed a  decreased ejection fraction of 25%.  Dr. Derrell Lolling was consulted.  There was  some issue whether she had dyskinesia by her symptoms.  We discussed with  her options for her which would include cholecystectomy as an option of  treatment of this, but explained to her that there is a risk of about 25 to  30% this may not help her symptoms.  She discussed this with her mother and  they both agreed to proceed since she was having significant discomfort and  felt this might help her.  The procedure was explained to the patient and  gross complications, including infection, bile leak, common duct injury,  injury to other organs, abdominal wall hematoma, hernia, and other potential  complications of cardiovascular and DVT.  She understood and agreed to  proceed.   DESCRIPTION OF PROCEDURE:  The patient is brought to the operating room and  placed supine.  After  induction of general anesthesia, the abdomen is  prepped and draped in sterile fashion.  A 1 cm incision was made just below  her umbilicus.  Dissection was carried down to her fascia.  Her fascia was  opened in the midline with a scalpel.  Kochers were used to grasp both edges  of the fascia and a small hemostat was used to open the peritoneum.  Pursestring suture of 0-Vicryl was placed and a 12-mm Hasson cannula was  placed under direct vision.  Pneumoperitoneum was then created and  laparoscope was placed.  Upon inspection of the abdominal cavity, there was  a small large bowel which appeared grossly normal.  Next, a subxiphoid port  was placed using 11 mm trocar under direct vision.  Two 5-mm ports were  placed both in the right abdominal wall under direct vision with local  anesthesia.  Gallbladder was identified by its dome.  There were no signs of  any chronic or acute inflammation.  It was grasped by the dome and retracted  toward the patient's right shoulder.  A second grasper was used to grab the  infundibulum.  Dissection was carried around the cystic duct.  Of note, the  common duct could be seen quite easily and it was not dilated.  Once we  dissected out the cystic duct, we placed a clip on the gallbladder side of  this.  Through a separate stab incision, a Reddick catheter was initially  placed.  A small incision was made in the cystic duct and attempt was made  to cannulate it with a Reddick catheter, but we were unsuccessful.  We  removed this and tried the [facility] cholangiogram catheter.  We were able to get  this in the cystic duct and held in place with a clip.  Intraoperative  cholangiogram was performed using half-strength Hypaque dye.  There is free  flow of contrast down a nondilated cystic duct into a common duct that was  not dilated.  There is free flow of contrast into the duodenum.  There is  reflux of contrast up the common hepatic duct into the right and left   hepatic ducts.  After this was done, I removed the catheter and placed two  clips across the cystic duct.  Two additional clips were placed, but these  slid off and I removed these.  The two clips that were placed on initially  were watertight looked like.  Next, cystic artery was identified.  It was  double clipped and divided.  There is also a posterior branch cystic artery  that was controlled with two clips and divided.  At this point, we used the  cautery to dissect the gallbladder from the gallbladder fossa.  Once this  was done, it was placed in an EndoCatch bag.  I inspected the gallbladder  bed, saw no oozing of blood or bile from the liver bed.  I did place one  piece of Surgicel down around the cystic artery since there is a little bit  of oozing from around that area, but the artery itself was not bleeding.  This did a good job of providing hemostasis.  At this point, I reinspected  the gallbladder bed and irrigated out that area and found it to be  hemostatic with no signs of bile leakage.  The irrigation was suctioned out.  The gallbladder was then extracted through the umbilical port with the help  of placing the camera in the subxiphoid port.  This was passed off the  field.  At this point, I let the CO2 escape and reinspected the abdominal  cavity and saw no evidence of bowel injury.  The ports were removed, the CO2  was released, the umbilical port site was closed with 0-Vicryl.  4-0  Monocryl was used to close the skin in a subcuticular fashion.  Steri-  Strips, dry dressings were applied.  The patient was awakened, taken to  recovery in satisfactory condition.  All final counts of sponge, needle and  instruments are found be correct.      Thomas A. Cornett, M.D.  Electronically Signed     TAC/MEDQ  D:  08/11/2006  T:  08/11/2006  Job:  [id number]

## 2011-02-04 NOTE — Consult Note (Signed)
[name], [name]              ACCOUNT NO.:  [id number]   MEDICAL RECORD NO.:  [id number]          PATIENT TYPE:  INP   LOCATION:  1621                         FACILITY:  [facility]   PHYSICIAN:  [name], M.D.DATE OF BIRTH:  April 09, 1981   DATE OF CONSULTATION:  08/09/2006  DATE OF DISCHARGE:                                   CONSULTATION   CHIEF COMPLAINT:  Right-sided abdominal pain and abnormal HIDA scan.   HISTORY OF PRESENT ILLNESS:  This is a 30 year old white female from  [place].  She has chronic alternating constipation and diarrhea  suggesting irritable bowel syndrome.  She says that for the past 11 months  she has had intermittent episodes of right-sided abdominal pain.  This will  come and go.  She says it can occur in the daytime or in the night time.  Onset will be sometimes gradual and sometimes abrupt.  There is no  exacerbating or alleviating factors.  There is no association with meals.  She says that she does get nauseated but does not vomit.  No change in her  chronic alternating diarrhea and constipation with this process.  No fever  or chills.  She has had no prior gastrointestinal problems.  Her last bowel  movement was 48 hours ago.  She is currently in the hospital tolerating a  liquid diet.   According to Dr. Christella Hartigan, she had some lab work up in [place] which showed  slight elevation of her transaminases.  As an outpatient, she had an upper  endoscopy and a colonoscopy on November 19 by Dr. Sheryn Bison which was  reportedly normal.  Because of ongoing pain, she was admitted here yesterday  by the [facility] GI service.  She had a gallbladder ultrasound which was  unremarkable.  She had a hepatobiliary scan which shows no obstruction, but  the gallbladder ejection fraction is 25% which is an abnormally low ejection  fraction.   Her lab work includes a CBC, hepatic profile, amylase, and lipase all of  which are unremarkable.  She is  reasonably comfortable right now but says  she does have pain today.   PAST HISTORY:  She has had a tonsillectomy and strabismus surgery.  Otherwise, no medical or surgical problems.  She does have asthma, she says.   CURRENT MEDICATIONS:  Prevacid.  Amitiza.  Asthmanex inhaler.  Birth control  pills.   DRUG ALLERGIES:  SULFA possibly, CELEBREX skin rash.   FAMILY HISTORY:  Colon cancer in a grandparent.  Cholecystectomy in a  grandmother.   SOCIAL HISTORY:  She is single, lives with her parents, has a boyfriend here  with her today, has a 60-year-old child, quit smoking recently, denies drug  use.   REVIEW OF SYSTEMS:  Fifteen system review of systems is performed and is  noncontributory except as above.   PHYSICAL EXAMINATION:  GENERAL:  Pleasant, cooperative young woman in no  obvious distress.  Her boyfriend and her mother are in the room with her.  VITAL SIGNS:  Blood pressure 141/80, heart rate 62 and regular, respirations  24 and unlabored, temperature  97.5.  HEENT:  Eyes:  Sclerae clear.  Extraocular movements intact.  Ear, nose,  mouth, and throat, nose, lips, tongue, and oropharynx are without gross  lesions.  NECK:  Supple, nontender.  No mass.  No jugular venous distension.  LUNGS:  Clear to auscultation.  No chest wall tenderness.  HEART:  Regular rate and rhythm.  No murmur.  Radial and femoral pulses are  palpable.  BREASTS:  Not examined.  ABDOMEN:  Soft and not distended.  There is no mass or hernia.  She is  subjectively tender to the right of the umbilicus, but there is no guarding  or rebound or peritoneal signs.  There is no mass.  I feel no hernia in the  inguinal area.  EXTREMITIES:  She moves all 4 extremities without pain and deformity.  NEUROLOGIC:  No gross motor sensory deficits.   ASSESSMENT:  1. Right-sided abdominal pain of uncertain etiology.  Possibilities      include acalculous cholecystitis and biliary dyskinesia.  This could be       atypical presentation of irritable bowel syndrome.  There may be some      other process going on with the small intestine or retroperitoneum that      is not detected at this time.  2. A history of asthma.  3. She does not have an acute surgical problem at this time.   PLAN:  1. I have talked to the patient and her mother and boyfriend about the      possibilities.  We have talked about the role of surgery and this      uncertainty of its role in resolving her symptoms.  Although given her      history and the abnormal HIDA scan, it certainly is something that      could be appropriately considered.  2. For the next 24 hours, we are going to treat her with antispasmodics      and proceed with a CT scan of the abdomen and pelvis with contrast to      make sure we are not missing some other obvious problem.  We will      reassess her tomorrow.  3. Empiric cholecystectomy remains an option but with only a 50-75% chance      of symptom resolution.  If she continues to have pain, this may to be      considered as an empiric option.   I have discussed the indication and details of gallbladder surgery with the  patient and her mother.  Risks and complications have been outlined,  including not limited to bleeding, infection, conversion to open laparotomy,  injury to adjacent organs such the intestine or main bile duct, cardiac,  pulmonary, and thromboembolic problems.  She seems to understand these  issues well.  At this time, all her questions are answered.  She agrees with  this plan and would like to hold off on surgery until we have done the CT  scan.      [name], M.D.  Electronically Signed     HMI/MEDQ  D:  08/09/2006  T:  08/09/2006  Job:  [id number]   cc:   Rachael Fee, MD  [address]  [place], [place] [address]   Ignacia Bayley Gans

## 2011-02-04 NOTE — H&P (Signed)
[name], [name]              ACCOUNT NO.:  [id number]   MEDICAL RECORD NO.:  [id number]          PATIENT TYPE:  INP   LOCATION:  0103                         FACILITY:  [facility]   PHYSICIAN:  Rachael Fee, MD   DATE OF BIRTH:  1981/01/27   DATE OF ADMISSION:  08/08/2006  DATE OF DISCHARGE:                                HISTORY & PHYSICAL   CHIEF COMPLAINT:  Severe right upper quadrant pain times 12 hours.   HISTORY:  [name] is a pleasant 30 year old white female, generally in good  health, who had been evaluated by Dr. Jarold Motto, on July 25, 2006, for  complaints of GERD and severe constipation.  She had been placed on Prevacid  30 b.i.d., which she says is helping, and also one Amitiza b.i.d. for her  constipation, which she is also finding helpful.  She was scheduled for  upper endoscopy and colonoscopy, which she underwent yesterday, August 07, 2006, which were both unremarkable.  The patient tolerated the procedures  well.   At this time she relates intermittent right upper quadrant pain which she  has been having over the past 3 weeks.  She says it was not aggravated by  her bowel prep, but has been somewhat progressive over the past 5-6 days and  now severe since last evening.  She rates it as a 8/10, describes it as  sharp and stabbing and radiating around into her right back.  She has had  some vague nausea, but no vomiting.  No fever or chills.  No change in her  pain with p.o. intake.  Denies any dysuria, urgency or frequency.  No  diarrhea, melena or hematochezia.  She does say that it hurts to lie on her  right side in the past 5 days and that she has had increased pain with going  from a sitting to a standing position.  She denies any known injury, heavy  lifting, etc.  She went to the ER in [place], [place] this morning,  close to her home.  Labs were done apparently showing mildly elevated LFTs  and they advised abdominal ultrasound, which could  not be done today.  She  called our office and, with the above complaints, was advised to come to the  emergency room here for further evaluation.   Laboratory studies from ER in [place] showed total bilirubin of 0.5, ALT of  58, AST of 43, alk phos of 98, creatinine of 0.82.  WBC of 4.8, hemoglobin  13.6, hematocrit of 38.2, MCV of 84.  UA was done and was negative.  We have  obtained amylase and lipase, which were normal, and abdominal ultrasound  which has been read as negative.  At this time she is still having pain and  is admitted overnight for a pain control, observation and a CCK HIDA scan  with continued suspicion for gallbladder disease.   CURRENT MEDICATIONS:  1. Amitiza 24 mcg b.i.d.  2. Prevacid 30 b.i.d.  3. Asmanex one p.o. q.d.  4. Yasmin birth control pill daily.   ALLERGIES:  SULFA WITH QUESTIONABLE REACTION AS A  CHILD AND CELEBREX WHICH  CAUSES HIVES.  IS ALSO SENSITIVE TO CITRIC ACID.   PAST HISTORY:  Benign.   FAMILY HISTORY:  Positive for colon cancer and polyps in her grandparents  and gallbladder disease in a grandmother.   SOCIAL HISTORY:  The patient is single, lives with her parents, has a 2-year-  old child.  She quit smoking recently.  Denies any drug use.  She does have  tattoos.   REVIEW OF SYSTEMS:  CARDIOVASCULAR:  Denies any chest pain or anginal  symptoms.  PULMONARY:  Negative for cough, shortness of breath or sputum  production.  GENITOURINARY:  Denies any dysuria, urgency or frequency.  No  history of ureterolithiasis.  MUSCULOSKELETAL:  Negative.   PHYSICAL EXAMINATION:  Well-developed white female in no acute distress.  She is alert and oriented x3.  Temperature is 97.8, blood pressure 137/91, pulse in the 70s.  HEENT: Nontraumatic, normocephalic.  EOMI, PERRLA.  Sclerae are anicteric.  NECK:  Supple without nodes.  CARDIOVASCULAR:  Regular rate and rhythm with S1-S2.  No murmur, rub or  gallop.  PULMONARY:  Clear to A and P.   ABDOMEN:  Soft.  Bowel sounds are active.  She is tender in the right upper  quadrant.  There is no rebound.  She does have some guarding.  She also has  some costal margin tenderness on the right and tenderness of the right lower  anterior rib cage.  No mass or hepatosplenomegaly.  No definite CVA  tenderness.  RECTAL:  Exam is not done today.  EXTREMITIES: No clubbing, cyanosis or edema.  NEUROLOGIC:  Grossly nonfocal.   IMPRESSION:  44. A 30 year old white female with 3-week history of intermittent right      upper quadrant pain, now progressive, with negative ultrasound      minimally elevated LFTs.  Rule out chronic cholecystitis or biliary      dyskinesia.  2. Rule out musculoskeletal component to her pain, rule out irritable      bowel syndrome with spasm.  3. Gastroesophageal reflux disease.  4. Constipation.  5. Asthma.   PLAN:  The patient is admitted to the service of Dr. Wendall Papa for bowel  rest, pain control CCK HIDA scan and repeat labs in a.m.  For details please  see the orders.      Mike Gip, PA-C      Rachael Fee, MD  Electronically Signed    AE/MEDQ  D:  08/08/2006  T:  08/09/2006  Job:  [id number]

## 2011-02-04 NOTE — Assessment & Plan Note (Signed)
[facility]                           GASTROENTEROLOGY OFFICE NOTE   NAME:Hilgeman, VALARY MANAHAN                     MRN:          [id number]  DATE:07/25/2006                            DOB:          January 14, 1981    Jenavive Lamboy is a 30 year old white female mother of a 61-year-old.  She is  referred today through the courtesy of her mother for evaluation of severe  refractory constipation along with acid reflux symptoms.   Delorise Jackson has had constipation since childhood and has had to use enemas and  laxatives all of her life.  This has been worse over the last 6 weeks where  she is having to use three to four Dulcolax every several days to have a  bowel movement.  Associated with this increased constipation is some left  lower quadrant dull discomfort, abdominal gas and bloating, and mild nausea.  She apparently this past fall in 2006 had nausea and upper abdominal pain  with rather severe acid reflux symptoms and allegedly had an ultrasound exam  and was told that she had ulcers.  She was placed on Prevacid with marked  improvement and discontinued this in February but is back on Prevacid now  because of rather typical reflux symptoms with regurgitation and burning  substernal chest pain.  However, she denies dysphagia.  She does, however,  have some bladder emptying problems and is under the care of a urologist.  She has had no other neuromuscular difficulties.   The patient really has no urge to have a bowel movement and denies having to  disimpact herself, and has no tenesmus or rectal bleeding although she has  had some hemorrhoids with rectal itching.  She denies any specific food  intolerances.  She does have early satiety and eats frequent small meals.  She has had no skin rashes, joint pains, oral stomatitis, or visual  difficulties.  She does have oral ulcerations with citrus products.  She, as  far as I can see, has never had endoscopic exams or barium  studies of her  bowels.  She denies chronic fatigue or any history of hepatitis or  pancreatitis.   Her past medical history otherwise is fairly unremarkable except for a  history of kidney stones and asthmatic bronchitis.   CURRENT MEDICATIONS:  1. Prevacid 30 mg a day.  2. Yasmin daily for birth control pill.  3. Asmanex.   She in the past has had reactions to SULFA medication and CELEBREX.   FAMILY HISTORY:  Remarkable for colon cancer and colon polyps in both  grandparents.  Her grandmother and her father both have had peptic ulcer  disease.  Her maternal grandfather had prostate cancer.   SOCIAL HISTORY:  She is single and lives with her parents and has a 2-year-  old daughter.  She has three years of college education.  She does not smoke  or abuse ethanol.  Actually quit smoking several weeks ago and would smoke a  half a pack a day for many years.  She denies illicit IV drug use.   REVIEW OF SYSTEMS:  Otherwise noncontributory except  for some urinary  incontinency and a history of previous Macrodantin prescriptions.  She  denies any cardiovascular, pulmonary, other neurologic or neuropsychiatric,  or endocrine problems.  In the past has been told that she was anemic.  She has had previous tonsillectomy but no abdominal or pelvic surgery.  She  relates that she recently had GYN exam because of her left lower quadrant  pain and that this was normal.  She is under the care of Dr. Aldona Bar.   EXAMINATION:  She is an attractive, healthy-appearing white female in no  acute distress, appearing her stated age.  She weighs 136 pounds and blood  pressure 110/60.  Pulse was 68 and regular.  I could not appreciate stigmata  of chronic liver disease or thyromegaly.  Chest was clear to percussion and  auscultation.  There were no murmurs, gallops or rubs noted.  Appeared to be  in a regular rhythm.  I could not appreciate hepatosplenomegaly, abdominal  masses or tenderness except in the  left lower quadrant over her sigmoid  colon.  There were no hernias noted.  Peripheral extremities were  unremarkable and reflexes were normal.  Mental status was normal.  Rectal  exam was deferred.   ASSESSMENT:  1. 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  It sounds like she may have      mild gastroparesis.  2. Acid reflux disease with possible gastroparesis.  3. Abdominal gas, bloating, all secondary to #2.   RECOMMENDATIONS:  1. Check screening laboratory parameters including CBC, sed rate,      metabolic profile, thyroid function tests, and sprue panel.  2. Outpatient endoscopy and colonoscopy.  3. Reflux regime with Prevacid 30 mg twice a day.  4. Trial of Amitiza 24 mcg twice a day with meals.  5. Continue other medications as per Dr. Daphine Deutscher.     Vania Rea. Jarold Motto, MD, Caleen Essex, FAGA  Electronically Signed    DRP/MedQ  DD: 07/25/2006  DT: 07/25/2006  Job #: [id number]

## 2011-02-04 NOTE — Discharge Summary (Signed)
[name], Tami              ACCOUNT NO.:  [id number]   MEDICAL RECORD NO.:  [id number]          PATIENT TYPE:  INP   LOCATION:  1621                         FACILITY:  [facility]   PHYSICIAN:  Clovis Pu. Cornett, M.D.DATE OF BIRTH:  February 23, 1981   DATE OF ADMISSION:  08/08/2006  DATE OF DISCHARGE:  08/12/2006                                 DISCHARGE SUMMARY   ADMISSION DIAGNOSIS:  Abdominal pain.   DISCHARGE DIAGNOSES:  1. Biliary dyskinesia.  2. Irritable bowel syndrome.   PROCEDURES PERFORMED:  1. CT scan of abdomen and pelvis.  2. HIDA study.  3. Laparoscopic cholecystectomy with cholangiogram.  4. Upper and lower endoscopy.   BRIEF HISTORY:  The patient is a 30 year old female admitted to the  gastroenterology service on August 08, 2006, due to right upper quadrant  pain.  She was evaluated by Rachael Fee, MD.  Her workup was otherwise  negative except for a HIDA study which showed a depressed ejection fraction,  25%.  Angelia Mould. Derrell Lolling, M.D., was consulted from [facility].  They  discussed laparoscopic cholecystectomy to her since she was having  considerable pain.   This was done on August 11, 2006.  She tolerated the procedure well.  Her  overnight stay after that was otherwise unremarkable.  She had no fever, was  having adequate pain control and feeling well.  She was improved overall and  was discharged home on postoperative day #1 in satisfactory and improved  condition.   DISCHARGE INSTRUCTIONS:  She will follow up with [facility] in  2-3 weeks.  She has been given a prescription for Vicodin ES for pain and  will continue her preop medications as before.  She will follow up with Dr.  Jarold Motto of [facility] as they indicate.   CONDITION AT DISCHARGE:  Improved.      Thomas A. Cornett, M.D.  Electronically Signed     TAC/MEDQ  D:  08/12/2006  T:  08/12/2006  Job:  [id number]

## 2011-02-28 ENCOUNTER — Inpatient Hospital Stay (HOSPITAL_COMMUNITY)
Admission: RE | Admit: 2011-02-28 | Discharge: 2011-03-04 | DRG: 765 | Disposition: A | Discharge status: Home / Self Care | Payer: Medicaid Other | Source: Ambulatory Visit | Attending: Obstetrics and Gynecology | Admitting: Obstetrics and Gynecology

## 2011-02-28 DIAGNOSIS — O99814 Abnormal glucose complicating childbirth: Secondary | ICD-10-CM | POA: Diagnosis present

## 2011-02-28 DIAGNOSIS — O324XX0 Maternal care for high head at term, not applicable or unspecified: Secondary | ICD-10-CM | POA: Diagnosis present

## 2011-02-28 DIAGNOSIS — IMO0002 Reserved for concepts with insufficient information to code with codable children: Secondary | ICD-10-CM | POA: Diagnosis present

## 2011-02-28 LAB — COMPREHENSIVE METABOLIC PANEL
ALT: 10 U/L (ref 0–35)
AST: 21 U/L (ref 0–37)
Alkaline Phosphatase: 315 U/L — ABNORMAL HIGH (ref 39–117)
CO2: 21 mEq/L (ref 19–32)
Chloride: 104 mEq/L (ref 96–112)
GFR calc non Af Amer: >60 mL/min (ref >60)
Glucose, Bld: 45 mg/dL — ABNORMAL LOW (ref 70–99)
Sodium: 136 mEq/L (ref 135–145)
Total Bilirubin: 0.5 mg/dL (ref 0.3–1.2)

## 2011-02-28 LAB — URINE MICROSCOPIC-ADD ON

## 2011-02-28 LAB — URINALYSIS, ROUTINE W REFLEX MICROSCOPIC
Bilirubin Urine: NEGATIVE
Ketones, ur: NEGATIVE mg/dL
Protein, ur: NEGATIVE mg/dL
Urobilinogen, UA: 0.2 mg/dL (ref 0.0–1.0)

## 2011-02-28 LAB — CBC
HCT: 37.4 % (ref 36.0–46.0)
Hemoglobin: 11.8 g/dL — ABNORMAL LOW (ref 12.0–15.0)
Platelets: 229 10*3/uL (ref 150–400)
Platelets: 223 10*3/uL (ref 150–400)
RBC: 4.27 MIL/uL (ref 3.87–5.11)
RBC: 4.04 MIL/uL (ref 3.87–5.11)
RDW: 14.1 % (ref 11.5–15.5)
WBC: 8.7 10*3/uL (ref 4.0–10.5)
WBC: 9 10*3/uL (ref 4.0–10.5)

## 2011-02-28 LAB — PROTEIN / CREATININE RATIO, URINE: Protein Creatinine Ratio: 0.75 — ABNORMAL HIGH (ref 0.00–0.15)

## 2011-03-01 ENCOUNTER — Other Ambulatory Visit: Payer: Self-pay | Admitting: Obstetrics & Gynecology

## 2011-03-01 DIAGNOSIS — IMO0002 Reserved for concepts with insufficient information to code with codable children: Secondary | ICD-10-CM

## 2011-03-01 DIAGNOSIS — O99814 Abnormal glucose complicating childbirth: Secondary | ICD-10-CM

## 2011-03-01 LAB — GLUCOSE, CAPILLARY
Glucose-Capillary: 134 mg/dL — ABNORMAL HIGH (ref 70–99)
Glucose-Capillary: 50 mg/dL — ABNORMAL LOW (ref 70–99)
Glucose-Capillary: 40 mg/dL — CL (ref 70–99)
Glucose-Capillary: 72 mg/dL (ref 70–99)
Glucose-Capillary: 85 mg/dL (ref 70–99)
Glucose-Capillary: 58 mg/dL — ABNORMAL LOW (ref 70–99)
Glucose-Capillary: 59 mg/dL — ABNORMAL LOW (ref 70–99)
Glucose-Capillary: 190 mg/dL — ABNORMAL HIGH (ref 70–99)
Glucose-Capillary: 347 mg/dL — ABNORMAL HIGH (ref 70–99)
Glucose-Capillary: 209 mg/dL — ABNORMAL HIGH (ref 70–99)

## 2011-03-01 LAB — MRSA PCR SCREENING: MRSA by PCR: POSITIVE — AB

## 2011-03-02 LAB — GLUCOSE, CAPILLARY: Glucose-Capillary: 101 mg/dL — ABNORMAL HIGH (ref 70–99)

## 2011-03-02 LAB — CBC
HCT: 31.2 % — ABNORMAL LOW (ref 36.0–46.0)
Hemoglobin: 10.2 g/dL — ABNORMAL LOW (ref 12.0–15.0)
MCH: 28.8 pg (ref 26.0–34.0)
MCHC: 32.7 g/dL (ref 30.0–36.0)
MCV: 88.1 fL (ref 78.0–100.0)

## 2011-03-03 LAB — GLUCOSE, CAPILLARY
Glucose-Capillary: 50 mg/dL — ABNORMAL LOW (ref 70–99)
Glucose-Capillary: 71 mg/dL (ref 70–99)
Glucose-Capillary: 75 mg/dL (ref 70–99)
Glucose-Capillary: 93 mg/dL (ref 70–99)
Glucose-Capillary: 99 mg/dL (ref 70–99)

## 2011-03-04 LAB — GLUCOSE, CAPILLARY: Glucose-Capillary: 81 mg/dL (ref 70–99)

## 2011-03-04 NOTE — H&P (Signed)
  NAMEANJOLIE, [name]              ACCOUNT NO.:  [id number]  MEDICAL RECORD NO.:  [id number]  LOCATION:  173                            FACILITY:  PHYSICIAN:  Tilda Burrow, M.D. DATE OF BIRTH:  February 25, 1981  DATE OF ADMISSION: DATE OF DISCHARGE:                             HISTORY & PHYSICAL   ADMISSION DIAGNOSES: 1. Pregnancy 39 weeks' gestation. 2. Gestational diabetes A2, on glyburide 5 mg b.i.d. plus Lantus 20 at     bedtime.  HISTORY OF PRESENT ILLNESS:  This 30 year old female gravida 2, para 0-0- 1-0 with consensus EDC of March 07, 2011, based on a 9-week ultrasound and confirmed at her 20-week ultrasound, is admitted for induction of labor.  She is 39 weeks' gestation.  Menstrual LMP was May 22, 2010, which suggested an [facility] of February 26, 2011, but is not being used as the [facility].  Prenatal course has been notable for gestational diabetes with abnormal early 2-hour glucose tolerance test.  She was placed on glyburide, then Lantus 20 units was added at bedtime.  Current glyburide dosing is 5 mg b.i.d.  Blood sugars have been excellent on this regimen.  She was instructed to take her Lantus at bedtime tonight before admission and to eat a normal bedtime snack and breakfast.  She was not to take her admission morning glyburide.  Ultrasound for estimated fetal weight was performed on Feb 15, 2010, when the baby weighed 7 pounds 6 ounces (67th percentile).  PAST MEDICAL HISTORY:  Positive for IBS-C.  SURGICAL HISTORY:  Cholecystectomy, tonsillectomy, and eye surgery.  She has no drug allergies.  SOCIAL HABITS:  Nonsmoker, nondrinker.  Denies recreational drugs. Single, lives with baby of the father.  She is a Nature conservation officer at [employer].  PRENATAL LABS:  Include blood type O positive, rubella immune present, hemoglobin 11, hematocrit 39, platelets 308.  Hepatitis, HIV, RPR, GC and Chlamydia all negative.  Pap smear is negative.  MSAFP normal  at 18 weeks.  Her partner, Suzan Garibaldi, is supportive.  It is a female.  She plans to breastfeed, bottle supplement.  Future contraception is not documented.  PHYSICAL EXAMINATION:  VITAL SIGNS:  Height __________, weight 153 which is a 6-pound weight gain this pregnancy.  Fundal height is 39 cm. Estimated fetal weight of 7-1/2-8 pounds.  Cervix is fingertip.  Cervix was 2 cm, 50%, -2 on last evaluation by Dr. Despina Hidden.  PLAN:  Admit for induction of labor on 5:30 a.m on the day of admission.  ADDENDUM:  The patient was on HSV 2 suppression since 34 weeks.     Tilda Burrow, M.D.     JVF/MEDQ  D:  02/25/2011  T:  02/26/2011  Job:  [id number]  cc:   [facility]  Electronically Signed by Christin Bach M.D. on 03/04/2011 07:37:10 PM

## 2011-03-10 NOTE — Op Note (Signed)
[name], MORAVEK NO.:  [id number]  MEDICAL RECORD NO.:  [id number]  LOCATION:  9373                          FACILITY:  [facility]  PHYSICIAN:  Scheryl Darter, MD       DATE OF BIRTH:  1981-05-28  DATE OF PROCEDURE: DATE OF DISCHARGE:                              OPERATIVE REPORT   PROCEDURE:  Primary low transverse cesarean section.  PREOPERATIVE DIAGNOSES:  Second stage arrest of labor and nonreassuring fetal heart rate tracing.  POSTOPERATIVE DIAGNOSES: 1. Intrauterine pregnancy delivered. 2. Live born female infant, Apgars of 1 and 7.  Cord pH of 7.29,     delivered at 0314.  SURGEON:  Scheryl Darter, MD.  ANESTHESIA:  Epidural.  ESTIMATED BLOOD BLOOD:  700 mL.  COMPLICATIONS:  None.  DRAINS:  Foley catheter.  COUNTS:  Correct.  OPERATIVE COURSE:  The patient gave written consent for primary cesarean section.  She had pushed for 2 hours with no descent past +1 station and there was nonreassuring fetal heart rate tracing.  The patient had been induced due to gestational diabetes.  She was diagnosed with preeclampsia.  The patient's identification was confirmed.  She was brought to the OR and adequate epidural anesthesia was induced.  She was placed in dorsal supine position in left lateral tilt.  Foley catheter was in place.  Abdomen was sterilely prepped and draped.  A #10 blade was used to make a transverse Pfannenstiel incision down to the fascia. Hemostasis was obtained with cautery.  Fascia was incised and incision was extended transversely with scissors.  The fascia was separated from underlying tissue attachments with blunt and sharp dissection.  Bellies of the rectus muscles were separated.  Peritoneum was elevated and incised with Metzenbaum scissors and the incision was extended with blunt traction.  An Alexis retractor was placed.  Bladder flap was created by incising the vesicouterine fold and this was carried transversely with Metzenbaum  scissors.  A #10 blade was used to make an uterine incision.  Clear fluid was expressed.  Incision was extended transversely with blunt traction.  Fetal head was elevated from deep in the pelvis without difficulty.  Head was delivered.  Mouth and nose were cleared with bulb suction.  Infant was delivered atraumatically.  Cord was clamped and cut and the infant was handed to Dr. Alison Murray in attendance.  There was initially a low Apgars score.  Apgars were 1 and 7, but infant responded well to resuscitative efforts and the cord pH was obtained.  Cord pH was 7.29.  Infant was taken to the newborn nursery after delivery.  Infant was a live born female at 81, 7 pounds 6 ounces.  Placenta was removed for the uterine cavity and uterine cavity was explored.  The patient received IV Pitocin.  The uterine incision was closed with a running locking suture of 0 Vicryl.  An imbricating layer followed with 0 Vicryl and good hemostasis was seen.  Pelvis was inspected and normal adnexa were seen.  The retractor was then removed. Anterior peritoneum was closed with a running suture with 2-0 Vicryl. Fascia was closed with a running suture of 0 Vicryl.  Hemostasis was obtained in the  incision and the incision was irrigated.  Skin was closed with a running subcuticular suture with 4-0 Vicryl.  Sterile dressing was applied.  The patient tolerated the procedure well without complications.  She was brought in stable condition to the recovery room.     Scheryl Darter, MD     JA/MEDQ  D:  03/01/2011  T:  03/01/2011  Job:  [id number]  Electronically Signed by Scheryl Darter MD on 03/10/2011 01:54:02 PM

## 2011-03-15 NOTE — Discharge Summary (Signed)
[name], [name]              ACCOUNT NO.:  [id number]  MEDICAL RECORD NO.:  [id number]  LOCATION:  9112                          FACILITY:  [facility]  PHYSICIAN:  Allie Bossier, MD        DATE OF BIRTH:  1980/12/27  DATE OF ADMISSION:  02/28/2011 DATE OF DISCHARGE:  03/04/2011                              DISCHARGE SUMMARY   ADMITTING DIAGNOSES: 1. Intrauterine pregnancy at 39-0/7 weeks. 2. Gestational diabetes mellitus. 3. Unfavorable cervix.  DISCHARGE DIAGNOSES: 1. Intrauterine pregnancy at 39-0/7 weeks. 2. Gestational diabetes mellitus. 3. Unfavorable cervix. 4. Failure to descend. 5. Nonreassuring fetal heart rate.  HOSPITAL COURSE:  [name] is a 30 year old gravida 2, para 0-0-1-0 who was admitted at 39-0/[redacted] weeks gestation for induction of labor due to gestational diabetes.  Her pregnancy has been followed by the [facility] with onset of care at 9 weeks and has been remarkable for: 1. Preterm contractions at 25 weeks. 2. Gestational diabetes- controlled with glyburide 5mg  BID & Lantus 20u qhs 3. Resolved previa.    The patient was given a room on Labor and Delivery.  Fetal heart rate was reactive.  Her cervix was 2 cm.  Pitocin was started.  By the evening of June 11, the patient had become completely dilated; and after a combination of laboring down and pushing, it was noted that there was no further descent of the fetal vertex which remained at -1 station.  Dr. Debroah Loop evaluated the patient and it was recommended that she proceed with cesarean section.  The patient was taken to the operating room where she underwent a primary low transverse cesarean section.  Infant was a viable female born at 3:14 a.m. on March 01, 2011, weight of 7 pounds 6 ounces, Apgar's of 1 and 7.  By postop day #1, the patient's vital signs were stable.  She was afebrile.  Her CBGs without medication were extremely elevated in the 200-300 range causing her glyburide to  be restarted as well as sliding scale initiated.  Her laboratories on postop day #1: hemoglobin 10.2, hematocrit 31.2, white blood cell count 18.9, and platelets 207,000.  Pre delivery, her hemoglobin had been 12.7, hematocrit 37.4, white blood cell count 8.7, and platelets 229,000.  By postop day #2, the patient was doing well.  She was breast- feeding and bottle feeding.  Vital signs remained stable and her CBGs were in the recommended range and by postop day #3, she was deemed to have received the full benefit of her hospital stay.  She was still undecided on contraception, however. She was discharged home.  DISCHARGE INSTRUCTIONS:  Per postpartum handout.  DISCHARGE MEDICATIONS: 1. Motrin 600 mg 1 p.o. q.6 h. p.r.n. pain. 2. Percocet 5/325, 1 p.o. q.4 h. p.r.n. pain. 3. Colace 100 mg 1 p.o. b.i.d. 4. Prenatal vitamin 1 p.o. daily. 5. Prilosec 20 mg 1 p.o. daily.  DISCHARGE FOLLOWUP:  Will occur at [facility] in 4-6 weeks or as needed.     Cam Hai, C.N.M.   ______________________________ Allie Bossier, MD    KS/MEDQ  D:  03/04/2011  T:  03/04/2011  Job:  [id number]  Electronically Signed by  KIMBERLY SHAW C.N.M. on 03/07/2011 09:31:38 PM Electronically Signed by Nicholaus Bloom MD on 03/15/2011 09:15:40 AM

## 2012-01-25 ENCOUNTER — Encounter: Payer: Self-pay | Admitting: Gastroenterology

## 2012-01-25 ENCOUNTER — Encounter: Payer: Self-pay | Admitting: *Deleted

## 2012-01-31 ENCOUNTER — Ambulatory Visit: Payer: Medicaid Other | Admitting: Gastroenterology

## 2012-02-22 ENCOUNTER — Telehealth: Payer: Self-pay | Admitting: Gastroenterology

## 2012-02-22 NOTE — Telephone Encounter (Signed)
Message copied by Arna Snipe on Wed Feb 22, 2012  9:29 AM ------      Message from: Harlow Mares D      Created: Tue Jan 31, 2012  8:35 AM       Please bill pt for same day cx      ----- Message -----         From: Holli Humbles         Sent: 01/31/2012   8:30 AM           To: Leonette Monarch, CMA            Pt canceled her appt for today because she said she was in "seen in ED yesterday"

## 2012-02-29 ENCOUNTER — Other Ambulatory Visit (HOSPITAL_COMMUNITY)
Admission: RE | Admit: 2012-02-29 | Discharge: 2012-02-29 | Disposition: A | Discharge status: Home / Self Care | Payer: Medicaid Other | Source: Ambulatory Visit | Attending: Obstetrics & Gynecology | Admitting: Obstetrics & Gynecology

## 2012-02-29 ENCOUNTER — Other Ambulatory Visit: Payer: Self-pay | Admitting: Obstetrics & Gynecology

## 2012-02-29 DIAGNOSIS — Z01419 Encounter for gynecological examination (general) (routine) without abnormal findings: Secondary | ICD-10-CM | POA: Insufficient documentation

## 2013-06-19 ENCOUNTER — Encounter: Payer: Self-pay | Admitting: Obstetrics & Gynecology

## 2013-06-19 ENCOUNTER — Ambulatory Visit (INDEPENDENT_AMBULATORY_CARE_PROVIDER_SITE_OTHER): Payer: Medicaid Other | Admitting: Obstetrics & Gynecology

## 2013-06-19 ENCOUNTER — Other Ambulatory Visit (HOSPITAL_COMMUNITY)
Admission: RE | Admit: 2013-06-19 | Discharge: 2013-06-19 | Disposition: A | Discharge status: Home / Self Care | Payer: Medicaid Other | Source: Ambulatory Visit | Attending: Obstetrics & Gynecology | Admitting: Obstetrics & Gynecology

## 2013-06-19 VITALS — BP 118/90 | Ht 61.0 in | Wt 159.5 lb

## 2013-06-19 DIAGNOSIS — Z1151 Encounter for screening for human papillomavirus (HPV): Secondary | ICD-10-CM | POA: Insufficient documentation

## 2013-06-19 DIAGNOSIS — Z01419 Encounter for gynecological examination (general) (routine) without abnormal findings: Secondary | ICD-10-CM | POA: Insufficient documentation

## 2013-06-19 DIAGNOSIS — Z3049 Encounter for surveillance of other contraceptives: Secondary | ICD-10-CM

## 2013-06-19 DIAGNOSIS — Z113 Encounter for screening for infections with a predominantly sexual mode of transmission: Secondary | ICD-10-CM | POA: Insufficient documentation

## 2013-06-19 DIAGNOSIS — E119 Type 2 diabetes mellitus without complications: Secondary | ICD-10-CM

## 2013-06-19 DIAGNOSIS — R300 Dysuria: Secondary | ICD-10-CM

## 2013-06-19 LAB — POCT URINALYSIS DIPSTICK
Nitrite, UA: NEGATIVE
Protein, UA: NEGATIVE

## 2013-06-19 MED ORDER — LORAZEPAM 0.5 MG PO TABS: 0.5000 mg | ORAL_TABLET | ORAL | Status: DC | PRN

## 2013-06-19 MED ORDER — INSULIN REGULAR HUMAN 100 UNIT/ML IJ SOLN: INTRAMUSCULAR | Status: DC

## 2013-06-19 MED ORDER — ESCITALOPRAM OXALATE 20 MG PO TABS: 20.0000 mg | ORAL_TABLET | Freq: Every day | ORAL | Status: DC

## 2013-06-19 MED ORDER — GLYBURIDE 5 MG PO TABS: 10.0000 mg | ORAL_TABLET | Freq: Two times a day (BID) | ORAL | Status: DC

## 2013-06-19 MED ORDER — ETONOGESTREL-ETHINYL ESTRADIOL 0.12-0.015 MG/24HR VA RING: VAGINAL_RING | VAGINAL | Status: DC

## 2013-06-19 MED ORDER — INSULIN NPH (HUMAN) (ISOPHANE) 100 UNIT/ML ~~LOC~~ SUSP: SUBCUTANEOUS | Status: DC

## 2013-06-19 MED ORDER — INSULIN GLARGINE 100 UNIT/ML ~~LOC~~ SOLN: 20.0000 [IU] | Freq: Every day | SUBCUTANEOUS | Status: DC

## 2013-06-19 NOTE — Progress Notes (Signed)
Patient ID: [name], female   DOB: 02-15-1981, 32 y.o.   MRN: [id number] Subjective:     [name] is a 32 y.o. female here for a routine exam.  Patient's last menstrual period was 05/24/2013. No obstetric history on file. Current complaints: blood sugars high.    Gynecologic History Patient's last menstrual period was 05/24/2013. Contraception: NuvaRing vaginal inserts Last Pap: 2012. Results were: normal   Past Medical History  Diagnosis Date  . IBS (irritable bowel syndrome)     constipation  . Family history of malignant neoplasm of gastrointestinal tract   . Constipation   . Gastroparesis     Past Surgical History  Procedure Laterality Date  . Tonsillectomy  1992  . Eye surgery  1989  . Cholecystectomy  2007  . Cesarean section      OB History   Grav Para Term Preterm Abortions TAB SAB Ect Mult Living                  History   Social History  . Marital Status: Single    Spouse Name: N/A    Number of Children: N/A  . Years of Education: N/A   Social History Main Topics  . Smoking status: Former Smoker    Types: Cigarettes  . Smokeless tobacco: Never Used  . Alcohol Use: No  . Drug Use: No  . Sexual Activity: Not Currently    Birth Control/ Protection: Inserts   Other Topics Concern  . None   Social History Narrative  . None    Family History  Problem Relation Age of Onset  . Colon cancer Paternal Grandfather      Review of Systems  Review of Systems  Constitutional: Negative for fever, chills, weight loss, malaise/fatigue and diaphoresis.  HENT: Negative for hearing loss, ear pain, nosebleeds, congestion, sore throat, neck pain, tinnitus and ear discharge.   Eyes: Negative for blurred vision, double vision, photophobia, pain, discharge and redness.  Respiratory: Negative for cough, hemoptysis, sputum production, shortness of breath, wheezing and stridor.   Cardiovascular: Negative for chest pain, palpitations, orthopnea,  claudication, leg swelling and PND.  Gastrointestinal: negative for abdominal pain. Negative for heartburn, nausea, vomiting, diarrhea, constipation, blood in stool and melena.  Genitourinary: Negative for dysuria, urgency, frequency, hematuria and flank pain.  Musculoskeletal: Negative for myalgias, back pain, joint pain and falls.  Skin: Negative for itching and rash.  Neurological: Negative for dizziness, tingling, tremors, sensory change, speech change, focal weakness, seizures, loss of consciousness, weakness and headaches.  Endo/Heme/Allergies: Negative for environmental allergies and polydipsia. Does not bruise/bleed easily.  Psychiatric/Behavioral: Negative for depression, suicidal ideas, hallucinations, memory loss and substance abuse. The patient is not nervous/anxious and does not have insomnia.        Objective:    Physical Exam  Vitals reviewed. Constitutional: She is oriented to person, place, and time. She appears well-developed and well-nourished.  HENT:  Head: Normocephalic and atraumatic.        Right Ear: External ear normal.  Left Ear: External ear normal.  Nose: Nose normal.  Mouth/Throat: Oropharynx is clear and moist.  Eyes: Conjunctivae and EOM are normal. Pupils are equal, round, and reactive to light. Right eye exhibits no discharge. Left eye exhibits no discharge. No scleral icterus.  Neck: Normal range of motion. Neck supple. No tracheal deviation present. No thyromegaly present.  Cardiovascular: Normal rate, regular rhythm, normal heart sounds and intact distal pulses.  Exam reveals no gallop and no friction  rub.   No murmur heard. Respiratory: Effort normal and breath sounds normal. No respiratory distress. She has no wheezes. She has no rales. She exhibits no tenderness.  GI: Soft. Bowel sounds are normal. She exhibits no distension and no mass. There is no tenderness. There is no rebound and no guarding.  Genitourinary:  Breasts no masses skin changes or  nipple changes bilaterally      Vulva is normal without lesions Vagina is pink moist without discharge Cervix normal in appearance and pap is done Uterus is normal size shape and contour Adnexa is negative with normal sized ovaries  Rectal    Musculoskeletal: Normal range of motion. She exhibits no edema and no tenderness.  Neurological: She is alert and oriented to person, place, and time. She has normal reflexes. She displays normal reflexes. No cranial nerve deficit. She exhibits normal muscle tone. Coordination normal.  Skin: Skin is warm and dry. No rash noted. No erythema. No pallor.  Psychiatric: She has a normal mood and affect. Her behavior is normal. Judgment and thought content normal.       Assessment:    Healthy female exam.    Plan:    Contraception: NuvaRing vaginal inserts. Follow up in: 1 year. Pt to fax me her blood sugars

## 2013-06-20 ENCOUNTER — Telehealth: Payer: Self-pay | Admitting: Obstetrics & Gynecology

## 2013-06-20 MED ORDER — CIPROFLOXACIN HCL 500 MG PO TABS: 500.0000 mg | ORAL_TABLET | Freq: Two times a day (BID) | ORAL | Status: DC

## 2013-06-20 NOTE — Telephone Encounter (Signed)
Spoke with pt. Has burning with urination, and this am, has blood in urine. Has the urge to go, but just trickles. + pressure. Can you call in meds? Uses [facility] in [place]. Thanks!!!

## 2013-06-20 NOTE — Telephone Encounter (Signed)
Left message letting pt know Cipro had been e prescribed. JSY

## 2013-07-10 ENCOUNTER — Telehealth: Payer: Self-pay | Admitting: *Deleted

## 2013-07-10 NOTE — Telephone Encounter (Signed)
Pt states had faxed BS levels for past 2 weeks to our office on Monday for Dr. Despina Hidden to review. Pt asking if Dr. Despina Hidden has had an opportunity to review BS levels.

## 2013-07-10 NOTE — Telephone Encounter (Signed)
They look pretty good.  Continue to collect and stay on this same dosing. Send me another 2 weeks worth

## 2013-07-11 NOTE — Telephone Encounter (Signed)
Pt requesting refill on glyburide 5 mg tablets  Pt informed to continue to collect and stay on this same dosing, send results in 2 weeks.

## 2013-09-17 ENCOUNTER — Telehealth: Payer: Self-pay | Admitting: Obstetrics & Gynecology

## 2013-09-17 NOTE — Telephone Encounter (Signed)
Pt just wanted an update on status of blood sugars she sent by fax. Pt aware that Dr. Despina Hidden has the blood sugars and to send another week of blood sugars so he can decide what to do. Pt verbalized understanding.

## 2013-09-23 NOTE — Telephone Encounter (Signed)
Left message on changes to insulin regimen Check for 2 weeks then fax back

## 2013-10-09 ENCOUNTER — Telehealth: Payer: Self-pay | Admitting: Obstetrics & Gynecology

## 2013-10-09 NOTE — Telephone Encounter (Signed)
1.Pt states taking Lantus 40 units @ HS, Cost pt $288.00 but can use a Coupon for $100.00 discount if she can get pen vs. Vial.  2. Pt c/o tingling with numbness in L foot ,cramping both shoulders and hands, please advise. Pt states you had mentioned doing MRI in the past for these symptoms.

## 2013-10-10 MED ORDER — INSULIN GLARGINE 100 UNIT/ML SOLOSTAR PEN: 40.0000 [IU] | PEN_INJECTOR | Freq: Every day | SUBCUTANEOUS | Status: DC

## 2013-10-10 NOTE — Telephone Encounter (Signed)
Pt aware Lantus pen e-scribed.

## 2013-12-16 ENCOUNTER — Encounter: Payer: Self-pay | Admitting: *Deleted

## 2014-02-12 ENCOUNTER — Ambulatory Visit (INDEPENDENT_AMBULATORY_CARE_PROVIDER_SITE_OTHER): Payer: No Typology Code available for payment source | Admitting: Obstetrics & Gynecology

## 2014-02-12 ENCOUNTER — Encounter: Payer: Self-pay | Admitting: Obstetrics & Gynecology

## 2014-02-12 VITALS — BP 128/90 | Wt 169.0 lb

## 2014-02-12 DIAGNOSIS — N764 Abscess of vulva: Secondary | ICD-10-CM

## 2014-02-12 MED ORDER — CLINDAMYCIN HCL 300 MG PO CAPS: ORAL_CAPSULE | ORAL | Status: DC

## 2014-02-12 NOTE — Progress Notes (Signed)
Patient ID: [name], female   DOB: 06-25-1981, 33 y.o.   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

## 2014-02-19 ENCOUNTER — Telehealth: Payer: Self-pay | Admitting: *Deleted

## 2014-02-19 MED ORDER — LORAZEPAM 0.5 MG PO TABS: 0.5000 mg | ORAL_TABLET | ORAL | Status: DC | PRN

## 2014-02-19 NOTE — Telephone Encounter (Signed)
Pt requesting a refill on the Ativan 0.5 mg prn for anxiety.

## 2014-02-27 ENCOUNTER — Ambulatory Visit (INDEPENDENT_AMBULATORY_CARE_PROVIDER_SITE_OTHER): Payer: No Typology Code available for payment source | Admitting: Obstetrics & Gynecology

## 2014-02-27 ENCOUNTER — Encounter: Payer: Self-pay | Admitting: Obstetrics & Gynecology

## 2014-02-27 VITALS — BP 154/94 | Ht 62.0 in | Wt 165.0 lb

## 2014-02-27 DIAGNOSIS — N764 Abscess of vulva: Secondary | ICD-10-CM

## 2014-05-19 NOTE — Progress Notes (Signed)
Pt with vulvar boil recurrent for some time, flares and regresses  IDDM s/p pre treatment with cleocin Proceed today with incision and drainage  Area prepped 0.5% marcaine is injected  11 blade used and incised with little fluid returned Area enlarged to healthy tissue  Continue cleocin Local care

## 2014-06-23 ENCOUNTER — Telehealth: Payer: Self-pay | Admitting: Obstetrics & Gynecology

## 2014-06-23 MED ORDER — LORAZEPAM 0.5 MG PO TABS: 0.5000 mg | ORAL_TABLET | ORAL | Status: DC | PRN

## 2014-06-23 NOTE — Telephone Encounter (Signed)
Pt requesting a presciption for Ativan 0.5 mg.

## 2014-06-23 NOTE — Telephone Encounter (Signed)
Pt informed Ativan prescription faxed to pt drug store.

## 2014-07-18 ENCOUNTER — Telehealth: Payer: Self-pay | Admitting: Obstetrics & Gynecology

## 2014-07-18 NOTE — Telephone Encounter (Signed)
Pt states she has urinary frequency and burning and has trace of blood when she wipes, she is wanting to see if we could send her in an abx.

## 2014-07-18 NOTE — Telephone Encounter (Signed)
Left a message on pt vm that she needs to be seen before we can give her anything for a UTI, if symptoms are bad she can go to urgent care or call and see if she can get in here on Monday.

## 2014-07-21 MED ORDER — CIPROFLOXACIN HCL 500 MG PO TABS: 500.0000 mg | ORAL_TABLET | Freq: Two times a day (BID) | ORAL | Status: DC

## 2014-07-21 NOTE — Telephone Encounter (Signed)
Left message with female letting pt know Cipro was sent to pharmacy. JSY

## 2014-07-22 ENCOUNTER — Telehealth: Payer: Self-pay | Admitting: *Deleted

## 2014-07-23 NOTE — Telephone Encounter (Signed)
Pt states did not get the message that Dr. Despina HiddenEure had prescribed Cipro for UTI, went to ER Monday night they prescribed Flagyl for bacterial infection and Macrobid. Pt states she has been taken the Cipro instead of the Macrobid but wanted to clarify with Dr. Despina HiddenEure.

## 2014-07-23 NOTE — Telephone Encounter (Signed)
Returned call and answered her questions

## 2014-09-09 ENCOUNTER — Ambulatory Visit: Payer: No Typology Code available for payment source | Admitting: Obstetrics & Gynecology

## 2014-10-03 ENCOUNTER — Other Ambulatory Visit (HOSPITAL_COMMUNITY)
Admission: RE | Admit: 2014-10-03 | Discharge: 2014-10-03 | Disposition: A | Discharge status: Home / Self Care | Payer: 59 | Source: Ambulatory Visit | Attending: Obstetrics & Gynecology | Admitting: Obstetrics & Gynecology

## 2014-10-03 ENCOUNTER — Encounter: Payer: Self-pay | Admitting: Obstetrics & Gynecology

## 2014-10-03 ENCOUNTER — Ambulatory Visit (INDEPENDENT_AMBULATORY_CARE_PROVIDER_SITE_OTHER): Payer: 59 | Admitting: Obstetrics & Gynecology

## 2014-10-03 VITALS — BP 128/90 | Ht 62.0 in | Wt 141.4 lb

## 2014-10-03 DIAGNOSIS — Z01419 Encounter for gynecological examination (general) (routine) without abnormal findings: Secondary | ICD-10-CM | POA: Insufficient documentation

## 2014-10-03 MED ORDER — VALACYCLOVIR HCL 1 G PO TABS: ORAL_TABLET | ORAL | Status: DC

## 2014-10-03 MED ORDER — INSULIN GLARGINE 100 UNIT/ML SOLOSTAR PEN: 40.0000 [IU] | PEN_INJECTOR | Freq: Every day | SUBCUTANEOUS | Status: DC

## 2014-10-03 MED ORDER — ESCITALOPRAM OXALATE 20 MG PO TABS: 20.0000 mg | ORAL_TABLET | Freq: Every day | ORAL | Status: DC

## 2014-10-03 MED ORDER — LORAZEPAM 0.5 MG PO TABS: 0.5000 mg | ORAL_TABLET | ORAL | Status: DC | PRN

## 2014-10-03 NOTE — Progress Notes (Signed)
Patient ID: Tami MendVictoria N Humann, female   DOB: [birth year], 34 y.o.   MRN: [id number] Subjective:     [name] is a 34 y.o. female here for a routine exam.  Patient's last menstrual period was 09/14/2014. No obstetric history on file. Birth Control Method:  none Menstrual Calendar(currently): regular  Current complaints: none.   Current acute medical issues:  diabetic   Recent Gynecologic History Patient's last menstrual period was 09/14/2014. Last Pap: 2014,  normal Last mammogram: ,    Past Medical History  Diagnosis Date  . IBS (irritable bowel syndrome)     constipation  . Family history of malignant neoplasm of gastrointestinal tract   . Constipation   . Gastroparesis     Past Surgical History  Procedure Laterality Date  . Tonsillectomy  1992  . Eye surgery  1989  . Cholecystectomy  2007  . Cesarean section      OB History    No data available      History   Social History  . Marital Status: Single    Spouse Name: N/A    Number of Children: N/A  . Years of Education: N/A   Social History Main Topics  . Smoking status: Former Smoker    Types: Cigarettes  . Smokeless tobacco: Never Used  . Alcohol Use: No  . Drug Use: No  . Sexual Activity: Not Currently    Birth Control/ Protection: Inserts   Other Topics Concern  . None   Social History Narrative    Family History  Problem Relation Age of Onset  . Colon cancer Paternal Grandfather      Current outpatient prescriptions:  .  escitalopram (LEXAPRO) 20 MG tablet, Take 1 tablet (20 mg total) by mouth daily., Disp: 30 tablet, Rfl: 11 .  etonogestrel-ethinyl estradiol (NUVARING) 0.12-0.015 MG/24HR vaginal ring, Insert vaginally and leave in place for 3 consecutive weeks, then remove for 1 week., Disp: 1 each, Rfl: 11 .  glyBURIDE (DIABETA) 5 MG tablet, Take 2 tablets (10 mg total) by mouth 2 (two) times daily with a meal., Disp: 60 tablet, Rfl: 3 .  Insulin Glargine (LANTUS) 100 UNIT/ML Solostar  Pen, Inject 40 Units into the skin daily at 10 pm., Disp: 15 mL, Rfl: 11 .  insulin NPH (HUMULIN N,NOVOLIN N) 100 UNIT/ML injection, 20 units before breakfast and supper, Disp: 10 mL, Rfl: 11 .  insulin regular (NOVOLIN R,HUMULIN R) 100 units/mL injection, Use as directed twice a day prior to breakfast and supper, Disp: 10 mL, Rfl: 11 .  LORazepam (ATIVAN) 0.5 MG tablet, Take 1 tablet (0.5 mg total) by mouth as needed for anxiety., Disp: 60 tablet, Rfl: 1 .  nitrofurantoin (MACRODANTIN) 100 MG capsule, Take 100 mg by mouth 2 (two) times daily., Disp: , Rfl:  .  ciprofloxacin (CIPRO) 500 MG tablet, Take 1 tablet (500 mg total) by mouth 2 (two) times daily. (Patient not taking: Reported on 10/03/2014), Disp: 14 tablet, Rfl: 0 .  ciprofloxacin (CIPRO) 500 MG tablet, Take 1 tablet (500 mg total) by mouth 2 (two) times daily. (Patient not taking: Reported on 10/03/2014), Disp: 14 tablet, Rfl: 0 .  clindamycin (CLEOCIN) 300 MG capsule, 2 capsules TID (Patient not taking: Reported on 10/03/2014), Disp: 60 capsule, Rfl: 1 .  insulin glargine (LANTUS) 100 UNIT/ML injection, Inject 0.2 mLs (20 Units total) into the skin at bedtime. (Patient not taking: Reported on 10/03/2014), Disp: 10 mL, Rfl: 12  Review of Systems  Review of Systems  Constitutional:  Negative for fever, chills, weight loss, malaise/fatigue and diaphoresis.  HENT: Negative for hearing loss, ear pain, nosebleeds, congestion, sore throat, neck pain, tinnitus and ear discharge.   Eyes: Negative for blurred vision, double vision, photophobia, pain, discharge and redness.  Respiratory: Negative for cough, hemoptysis, sputum production, shortness of breath, wheezing and stridor.   Cardiovascular: Negative for chest pain, palpitations, orthopnea, claudication, leg swelling and PND.  Gastrointestinal: negative for abdominal pain. Negative for heartburn, nausea, vomiting, diarrhea, constipation, blood in stool and melena.  Genitourinary: Negative for  dysuria, urgency, frequency, hematuria and flank pain.  Musculoskeletal: Negative for myalgias, back pain, joint pain and falls.  Skin: Negative for itching and rash.  Neurological: Negative for dizziness, tingling, tremors, sensory change, speech change, focal weakness, seizures, loss of consciousness, weakness and headaches.  Endo/Heme/Allergies: Negative for environmental allergies and polydipsia. Does not bruise/bleed easily.  Psychiatric/Behavioral: Negative for depression, suicidal ideas, hallucinations, memory loss and substance abuse. The patient is not nervous/anxious and does not have insomnia.        Objective:  Blood pressure 128/90, height  (1.575 m), weight 141 lb 6.4 oz (64.139 kg), last menstrual period 09/14/2014.   Physical Exam  Vitals reviewed. Constitutional: She is oriented to person, place, and time. She appears well-developed and well-nourished.  HENT:  Head: Normocephalic and atraumatic.        Right Ear: External ear normal.  Left Ear: External ear normal.  Nose: Nose normal.  Mouth/Throat: Oropharynx is clear and moist.  Eyes: Conjunctivae and EOM are normal. Pupils are equal, round, and reactive to light. Right eye exhibits no discharge. Left eye exhibits no discharge. No scleral icterus.  Neck: Normal range of motion. Neck supple. No tracheal deviation present. No thyromegaly present.  Cardiovascular: Normal rate, regular rhythm, normal heart sounds and intact distal pulses.  Exam reveals no gallop and no friction rub.   No murmur heard. Respiratory: Effort normal and breath sounds normal. No respiratory distress. She has no wheezes. She has no rales. She exhibits no tenderness.  GI: Soft. Bowel sounds are normal. She exhibits no distension and no mass. There is no tenderness. There is no rebound and no guarding.  Genitourinary:  Breasts no masses skin changes or nipple changes bilaterally      Vulva is normal without lesions Vagina is pink moist without  discharge Cervix normal in appearance and pap is done Uterus is normal size shape and contour Adnexa is negative with normal sized ovaries   Musculoskeletal: Normal range of motion. She exhibits no edema and no tenderness.  Neurological: She is alert and oriented to person, place, and time. She has normal reflexes. She displays normal reflexes. No cranial nerve deficit. She exhibits normal muscle tone. Coordination normal.  Skin: Skin is warm and dry. No rash noted. No erythema. No pallor.  Psychiatric: She has a normal mood and affect. Her behavior is normal. Judgment and thought content normal.       Assessment:    normal yearly gyn exam.    Plan:    Contraception: none. Follow up in: 1 year.

## 2014-10-06 LAB — CYTOLOGY - PAP

## 2014-10-16 ENCOUNTER — Telehealth: Payer: Self-pay | Admitting: *Deleted

## 2014-10-16 NOTE — Telephone Encounter (Signed)
Disregard.  [employer] sending another fax.

## 2014-10-21 ENCOUNTER — Other Ambulatory Visit: Payer: Self-pay | Admitting: *Deleted

## 2014-10-21 DIAGNOSIS — E138 Other specified diabetes mellitus with unspecified complications: Secondary | ICD-10-CM

## 2014-10-23 ENCOUNTER — Telehealth: Payer: Self-pay | Admitting: Obstetrics & Gynecology

## 2014-10-23 LAB — HEMOGLOBIN A1C
Est. average glucose Bld gHb Est-mCnc: 258 mg/dL
Hgb A1c MFr Bld: 10.6 % — ABNORMAL HIGH (ref 4.8–5.6)

## 2014-10-23 NOTE — Telephone Encounter (Signed)
Pt informed of HBG A1C 10.6.

## 2014-11-07 ENCOUNTER — Telehealth: Payer: Self-pay | Admitting: *Deleted

## 2014-11-07 ENCOUNTER — Telehealth: Payer: Self-pay | Admitting: Obstetrics & Gynecology

## 2014-11-07 MED ORDER — GLUCAGON (RDNA) 1 MG IJ KIT: 1.0000 mg | PACK | Freq: Once | INTRAMUSCULAR | Status: DC | PRN

## 2014-11-07 NOTE — Telephone Encounter (Signed)
Pt states that she saw Dr. Despina HiddenEure in January. Pt was advised that she would need an Rx for glucagon injection and that Dr. Despina HiddenEure would need to decide either which type of insulin she would need. I advised the pt that I would send the message to Dr. Despina HiddenEure, pt verbalized understanding.

## 2014-11-07 NOTE — Telephone Encounter (Signed)
Pt called back and other telephone encounter was used!

## 2014-11-13 ENCOUNTER — Telehealth: Payer: Self-pay | Admitting: Obstetrics & Gynecology

## 2014-11-13 ENCOUNTER — Other Ambulatory Visit: Payer: Self-pay | Admitting: Obstetrics & Gynecology

## 2014-11-13 MED ORDER — INSULIN ASPART 100 UNIT/ML ~~LOC~~ SOLN: SUBCUTANEOUS | Status: DC

## 2014-11-13 NOTE — Telephone Encounter (Signed)
Pt states she needs a RX for Novolog or Humalog, whichever Dr. Despina HiddenEure prescribes for her Insulin Pump.

## 2014-11-24 ENCOUNTER — Telehealth: Payer: Self-pay | Admitting: *Deleted

## 2014-11-24 NOTE — Telephone Encounter (Signed)
Prior Approval - Novolog 100 units/ML [id number]

## 2014-11-28 ENCOUNTER — Telehealth: Payer: Self-pay | Admitting: *Deleted

## 2014-11-28 NOTE — Telephone Encounter (Signed)
[facility] [place] for [employer] Contour Next Lancet strips #120.

## 2014-11-28 NOTE — Telephone Encounter (Signed)
Please call in Bayer contour next lancet strips #120 to pt's pharmacy

## 2014-11-28 NOTE — Telephone Encounter (Signed)
Pt requesting the Bayer Contour lancet strips for Glucometer, #100 a month.

## 2014-12-22 ENCOUNTER — Telehealth: Payer: Self-pay | Admitting: *Deleted

## 2014-12-22 MED ORDER — INSULIN ASPART 100 UNIT/ML ~~LOC~~ SOLN: SUBCUTANEOUS | Status: DC

## 2014-12-22 NOTE — Telephone Encounter (Signed)
Spoke with pt letting her know Novolog rx was sent to mail order pharmacy. Dr. Despina HiddenEure is pretty sure she can use Novolin R but was advised to contact her [employer] rep just to be sure. Pt voiced understanding. JSY

## 2014-12-22 NOTE — Telephone Encounter (Signed)
I spoke with pt. We are working on getting pt her Novolog, she has to use a mail order for that. Pt wants to know if she can use Novolin N, Novolin R or Novolin 70/30 in her pump. Please advise. Thanks!! JSY

## 2014-12-22 NOTE — Telephone Encounter (Signed)
I am sure it is novolin R but to make sure she needs to contact her [employer] rep just to make sure

## 2015-01-05 ENCOUNTER — Telehealth: Payer: Self-pay | Admitting: Obstetrics & Gynecology

## 2015-01-05 NOTE — Telephone Encounter (Signed)
Left message x 1. JSY 

## 2015-01-06 ENCOUNTER — Ambulatory Visit: Payer: 59 | Admitting: Obstetrics & Gynecology

## 2015-01-06 ENCOUNTER — Other Ambulatory Visit: Payer: Self-pay | Admitting: Obstetrics & Gynecology

## 2015-01-06 NOTE — Telephone Encounter (Signed)
Spoke with pt letting her know Novolin R has been called into [facility] in [place]. JSY

## 2015-01-06 NOTE — Telephone Encounter (Signed)
I verbally called in the prescription, [employer]IC does not really recognize pump orders so I didn't want there to be confusion with the pharmacists

## 2015-01-06 NOTE — Telephone Encounter (Signed)
Spoke with pt. Pt has been using Novolin R and it's working fine. Can you send prescription to [facility] in [place] for Novolin R? She is on a pump. She has an appt to see you next Tuesday.  Thanks! JSY

## 2015-01-13 ENCOUNTER — Encounter: Payer: Self-pay | Admitting: Obstetrics & Gynecology

## 2015-01-13 ENCOUNTER — Ambulatory Visit: Payer: 59 | Admitting: Obstetrics & Gynecology

## 2015-01-26 ENCOUNTER — Telehealth: Payer: Self-pay | Admitting: Obstetrics & Gynecology

## 2015-01-27 NOTE — Telephone Encounter (Signed)
Informed pt we do have the prior authorization form for insulin pump supplies, Dr. Despina HiddenEure out of office past few days will try to get filled out and faxed asap. Pt verbalized understanding.

## 2015-02-05 ENCOUNTER — Telehealth: Payer: Self-pay | Admitting: Obstetrics & Gynecology

## 2015-02-05 NOTE — Telephone Encounter (Signed)
Pt informed our office has received form from [employer], will get Dr. Despina HiddenEure to complete and fax.

## 2015-02-09 ENCOUNTER — Telehealth: Payer: Self-pay | Admitting: *Deleted

## 2015-02-09 NOTE — Telephone Encounter (Signed)
Pt informed the [employer] Insulin supplies request was faxed.   Pt requesting RX for yeast, been on antibiotic for upper respiratory infection.

## 2015-02-10 ENCOUNTER — Telehealth: Payer: Self-pay | Admitting: Obstetrics & Gynecology

## 2015-02-10 MED ORDER — FLUCONAZOLE 150 MG PO TABS: 150.0000 mg | ORAL_TABLET | Freq: Once | ORAL | Status: DC

## 2015-04-15 ENCOUNTER — Telehealth: Payer: Self-pay | Admitting: Obstetrics & Gynecology

## 2015-04-15 ENCOUNTER — Other Ambulatory Visit: Payer: Self-pay | Admitting: Obstetrics & Gynecology

## 2015-04-15 MED ORDER — FLUCONAZOLE 150 MG PO TABS: 150.0000 mg | ORAL_TABLET | Freq: Once | ORAL | Status: DC

## 2015-04-28 ENCOUNTER — Ambulatory Visit: Payer: 59 | Admitting: Adult Health

## 2015-05-27 ENCOUNTER — Telehealth: Payer: Self-pay | Admitting: Obstetrics & Gynecology

## 2015-05-27 NOTE — Telephone Encounter (Signed)
Pt requesting Diflucan for yeast.  

## 2015-05-28 ENCOUNTER — Telehealth: Payer: Self-pay | Admitting: Obstetrics & Gynecology

## 2015-05-28 MED ORDER — FLUCONAZOLE 150 MG PO TABS: 150.0000 mg | ORAL_TABLET | Freq: Once | ORAL | Status: DC

## 2015-06-10 ENCOUNTER — Other Ambulatory Visit: Payer: Self-pay | Admitting: Obstetrics & Gynecology

## 2015-06-16 ENCOUNTER — Telehealth: Payer: Self-pay | Admitting: Obstetrics & Gynecology

## 2015-06-16 MED ORDER — FLUCONAZOLE 150 MG PO TABS: 150.0000 mg | ORAL_TABLET | Freq: Once | ORAL | Status: DC

## 2015-07-07 ENCOUNTER — Telehealth: Payer: Self-pay | Admitting: Obstetrics & Gynecology

## 2015-07-07 MED ORDER — TERCONAZOLE 0.4 % VA CREA: 1.0000 | TOPICAL_CREAM | Freq: Every day | VAGINAL | Status: DC

## 2015-07-07 NOTE — Telephone Encounter (Signed)
Pt states been on Diflucan x 3 times since August for yeast infection, keep reoccurring and Diflucan not as effective. Is there an alternative?

## 2015-07-08 NOTE — Telephone Encounter (Signed)
Pt informed Terazol e-scribed, if no improvement pt will need to come in for evaluation. Pt verbalized understanding.

## 2015-07-22 ENCOUNTER — Encounter: Payer: Self-pay | Admitting: Obstetrics & Gynecology

## 2015-07-22 ENCOUNTER — Ambulatory Visit (INDEPENDENT_AMBULATORY_CARE_PROVIDER_SITE_OTHER): Payer: BLUE CROSS/BLUE SHIELD | Admitting: Obstetrics & Gynecology

## 2015-07-22 VITALS — BP 110/80 | HR 74

## 2015-07-22 DIAGNOSIS — E1142 Type 2 diabetes mellitus with diabetic polyneuropathy: Secondary | ICD-10-CM

## 2015-07-22 DIAGNOSIS — E0842 Diabetes mellitus due to underlying condition with diabetic polyneuropathy: Secondary | ICD-10-CM

## 2015-07-22 DIAGNOSIS — R202 Paresthesia of skin: Secondary | ICD-10-CM | POA: Diagnosis not present

## 2015-07-22 MED ORDER — GABAPENTIN 300 MG PO CAPS: 300.0000 mg | ORAL_CAPSULE | Freq: Three times a day (TID) | ORAL | Status: DC

## 2015-07-22 NOTE — Progress Notes (Signed)
Patient ID: [name], female   DOB: Oct 19, 1980, 34 y.o.   MRN: [id number] Chief Complaint  Patient presents with  . tingling sensation    handsand feet.    Blood pressure 110/80, pulse 74, last menstrual period 07/07/2015.  34 y.o. No obstetric history on file. Patient's last menstrual period was 07/07/2015. The current method of family planning is none.  Subjective Pt states she has been having allergic reaction to the adhesive of the pump, has not been using for 2 months I goth the patient on the pump but have not been in touch with rep and rep has not been following up with the patient She has been doing the lantus and novolin herself the last few months and it has not been working She now has developed tingling in feet and hands Her last HgbA1C was 10.6 Pt now has insurance and will get referred to an endocrinologist She is supposed to be getting a new adhesive from [employer] so she can tolerate the pump  Objective   Pertinent ROS   Labs or studies     Impression Diagnoses this Encounter::   ICD-9-CM ICD-10-CM   1. Diabetic polyneuropathy associated with diabetes mellitus due to underlying condition (HCC) 249.60 E08.42 HgB A1c   357.2      Established relevant diagnosis(es):   Plan/Recommendations: Meds ordered this encounter  Medications  . gabapentin (NEURONTIN) 300 MG capsule    Sig: Take 1 capsule (300 mg total) by mouth 3 (three) times daily.    Dispense:  90 capsule    Refill:  3    Labs or Scans Ordered: Orders Placed This Encounter  Procedures  . HgB A1c      Follow up Return if symptoms worsen or fail to improve.      Face to face time:  15 minutes  Greater than 50% of the visit time was spent in counseling and coordination of care with the patient.  The summary and outline of the counseling and care coordination is summarized in the note above.   All questions were answered.  Past Medical History  Diagnosis Date  . IBS  (irritable bowel syndrome)     constipation  . Family history of malignant neoplasm of gastrointestinal tract   . Constipation   . Gastroparesis     Past Surgical History  Procedure Laterality Date  . Tonsillectomy  1992  . Eye surgery  1989  . Cholecystectomy  2007  . Cesarean section      OB History    No data available      Allergies  Allergen Reactions  . Celebrex [Celecoxib]   . Sulfonamide Derivatives     Social History   Social History  . Marital Status: Single    Spouse Name: N/A  . Number of Children: N/A  . Years of Education: N/A   Social History Main Topics  . Smoking status: Former Smoker    Types: Cigarettes  . Smokeless tobacco: Never Used  . Alcohol Use: No  . Drug Use: No  . Sexual Activity: Not Currently    Birth Control/ Protection: Inserts   Other Topics Concern  . None   Social History Narrative    Family History  Problem Relation Age of Onset  . Colon cancer Paternal Grandfather

## 2015-07-23 ENCOUNTER — Telehealth: Payer: Self-pay | Admitting: *Deleted

## 2015-07-23 LAB — HEMOGLOBIN A1C
ESTIMATED AVERAGE GLUCOSE: 217 mg/dL
HEMOGLOBIN A1C: 9.2 % — AB (ref 4.8–5.6)

## 2015-07-23 NOTE — Addendum Note (Signed)
Addended by: Criss AlvinePULLIAM, Kailyn Dubie G on: 07/23/2015 04:36 PM   Modules accepted: Orders

## 2015-07-23 NOTE — Telephone Encounter (Signed)
Pt informed of HGB of 9.2 from 07/22/2015. Pt also informed had contacted Endocrinologist here in  waiting for a return call. Pt verbalized understanding.

## 2015-07-29 ENCOUNTER — Telehealth: Payer: Self-pay | Admitting: Obstetrics & Gynecology

## 2015-07-29 NOTE — Telephone Encounter (Signed)
Pt states she is no longer able to get her supplies for her insulin pump due to a 5,000 deductible. Pt requesting to go back on Novolog insulin injection or whatever Dr. Despina HiddenEure recommends until she can be seen by the Endocrinologist.   Pt also states she has not heard from Dr. Isidoro DonningNida's office in regards to the Endocrinologist referral. Informed pt referral completed. Pt states will call Dr. Isidoro DonningNida's office about an appt.

## 2015-07-30 ENCOUNTER — Telehealth: Payer: Self-pay | Admitting: *Deleted

## 2015-07-30 ENCOUNTER — Telehealth: Payer: Self-pay | Admitting: Obstetrics & Gynecology

## 2015-07-30 MED ORDER — INSULIN ASPART 100 UNIT/ML ~~LOC~~ SOLN: 10.0000 [IU] | Freq: Three times a day (TID) | SUBCUTANEOUS | Status: DC

## 2015-07-30 MED ORDER — INSULIN GLARGINE 100 UNIT/ML ~~LOC~~ SOLN
40.0000 [IU] | Freq: Every day | SUBCUTANEOUS | Status: DC
Start: 2015-07-30 — End: 2015-10-05

## 2015-07-30 NOTE — Telephone Encounter (Signed)
Talked with pt,  Not real sure where to start  Lantus 40 qhs and novolog 10 with each meal  Pt agrees and understands

## 2015-07-30 NOTE — Telephone Encounter (Signed)
Pt states has her appt with Dr. Isidoro DonningNida's office 09/07/2015 at 1:30 pm.

## 2015-07-30 NOTE — Telephone Encounter (Signed)
I called pt and left a message for pt to send me a message on my chart so we can figure out what to put her on

## 2015-08-20 ENCOUNTER — Ambulatory Visit: Payer: BLUE CROSS/BLUE SHIELD | Admitting: Obstetrics & Gynecology

## 2015-09-07 ENCOUNTER — Ambulatory Visit: Payer: BLUE CROSS/BLUE SHIELD | Admitting: "Endocrinology

## 2015-09-08 ENCOUNTER — Telehealth: Payer: Self-pay | Admitting: Obstetrics & Gynecology

## 2015-09-08 ENCOUNTER — Telehealth: Payer: Self-pay | Admitting: *Deleted

## 2015-09-08 MED ORDER — FLUCONAZOLE 150 MG PO TABS: 150.0000 mg | ORAL_TABLET | Freq: Once | ORAL | Status: DC

## 2015-09-08 NOTE — Telephone Encounter (Signed)
Done

## 2015-10-05 ENCOUNTER — Encounter: Payer: Self-pay | Admitting: "Endocrinology

## 2015-10-05 ENCOUNTER — Ambulatory Visit (INDEPENDENT_AMBULATORY_CARE_PROVIDER_SITE_OTHER): Payer: BLUE CROSS/BLUE SHIELD | Admitting: "Endocrinology

## 2015-10-05 VITALS — BP 121/87 | HR 85 | Ht 62.0 in | Wt 148.0 lb

## 2015-10-05 DIAGNOSIS — E139 Other specified diabetes mellitus without complications: Secondary | ICD-10-CM

## 2015-10-05 DIAGNOSIS — Z794 Long term (current) use of insulin: Secondary | ICD-10-CM

## 2015-10-05 MED ORDER — INSULIN GLARGINE 100 UNIT/ML ~~LOC~~ SOLN
30.0000 [IU] | Freq: Every day | SUBCUTANEOUS | Status: DC
Start: 2015-10-05 — End: 2017-06-06

## 2015-10-05 MED ORDER — INSULIN ASPART 100 UNIT/ML ~~LOC~~ SOLN: 10.0000 [IU] | Freq: Three times a day (TID) | SUBCUTANEOUS | Status: DC

## 2015-10-05 NOTE — Progress Notes (Signed)
Subjective:    Patient ID: [name], female    DOB: 1980-12-30. Patient is being seen in consultation for management of diabetes requested by  Tami Hector, MD  Past Medical History  Diagnosis Date  . IBS (irritable bowel syndrome)     constipation  . Family history of malignant neoplasm of gastrointestinal tract   . Constipation   . Gastroparesis    Past Surgical History  Procedure Laterality Date  . Tonsillectomy  1992  . Eye surgery  1989  . Cholecystectomy  2007  . Cesarean section     Social History   Social History  . Marital Status: Single    Spouse Name: N/A  . Number of Children: N/A  . Years of Education: N/A   Social History Main Topics  . Smoking status: Former Smoker    Types: Cigarettes  . Smokeless tobacco: Never Used  . Alcohol Use: No  . Drug Use: No  . Sexual Activity: Not Currently    Birth Control/ Protection: Inserts   Other Topics Concern  . None   Social History Narrative   Outpatient Encounter Prescriptions as of 10/05/2015  Medication Sig  . escitalopram (LEXAPRO) 20 MG tablet Take 20 mg by mouth daily.  Marland Kitchen gabapentin (NEURONTIN) 300 MG capsule Take 1 capsule (300 mg total) by mouth 3 (three) times daily.  Marland Kitchen glucagon (GLUCAGON EMERGENCY) 1 MG injection Inject 1 mg into the vein once as needed.  . insulin aspart (NOVOLOG) 100 UNIT/ML injection Inject 10-16 Units into the skin 3 (three) times daily with meals.  . insulin glargine (LANTUS) 100 UNIT/ML injection Inject 0.3 mLs (30 Units total) into the skin at bedtime.  Marland Kitchen LORazepam (ATIVAN) 0.5 MG tablet TAKE ONE TABLET BY MOUTH AS NEEDED FOR ANXIETY  . [DISCONTINUED] insulin aspart (NOVOLOG) 100 UNIT/ML injection Inject 10 Units into the skin 3 (three) times daily before meals.  . [DISCONTINUED] insulin glargine (LANTUS) 100 UNIT/ML injection Inject 0.4 mLs (40 Units total) into the skin at bedtime.  . fluconazole (DIFLUCAN) 150 MG tablet Take 1 tablet (150 mg total) by  mouth once. Take the second tablet 3 days after the first one. (Patient not taking: Reported on 10/05/2015)  . valACYclovir (VALTREX) 1000 MG tablet 1 tablet daily  . [DISCONTINUED] insulin regular (NOVOLIN R,HUMULIN R) 100 units/mL injection Use as directed twice a day prior to breakfast and supper   No facility-administered encounter medications on file as of 10/05/2015.   ALLERGIES: Allergies  Allergen Reactions  . Celebrex [Celecoxib]   . Latex   . Sulfonamide Derivatives    VACCINATION STATUS:  There is no immunization history on file for this patient.  Diabetes She presents for her initial diabetic visit. She has type 1 diabetes mellitus. Onset time: She was diagnosed at 35 years of age. She denies history of DKA, she has a history of being treated with oral medications including glipizide and metformin and with insulin as high as 120 units a day. Her disease course has been worsening. There are no hypoglycemic associated symptoms. Pertinent negatives for hypoglycemia include no confusion, headaches, pallor or seizures. Associated symptoms include fatigue, polydipsia and polyuria. Pertinent negatives for diabetes include no chest pain and no polyphagia. There are no hypoglycemic complications. Symptoms are worsening. There are no diabetic complications. Risk factors for coronary artery disease include diabetes mellitus. Current diabetic treatment includes insulin injections. She is compliant with treatment some of the time. Her weight is decreasing steadily. She is following a  generally unhealthy diet. When asked about meal planning, she reported none. She has not had a previous visit with a dietitian. She participates in exercise intermittently. Home blood sugar record trend: She monitored 20 times in the last 30 days average blood glucose 234. An ACE inhibitor/angiotensin II receptor blocker is not being taken.     Review of Systems  Constitutional: Positive for fatigue. Negative for  fever, chills and unexpected weight change.  HENT: Negative for trouble swallowing and voice change.   Eyes: Negative for visual disturbance.  Respiratory: Negative for cough, shortness of breath and wheezing.   Cardiovascular: Negative for chest pain, palpitations and leg swelling.  Gastrointestinal: Negative for nausea, vomiting and diarrhea.  Endocrine: Positive for polydipsia and polyuria. Negative for cold intolerance, heat intolerance and polyphagia.  Musculoskeletal: Negative for myalgias and arthralgias.  Skin: Negative for color change, pallor, rash and wound.  Neurological: Negative for seizures and headaches.  Psychiatric/Behavioral: Negative for suicidal ideas and confusion.    Objective:    BP 121/87 mmHg  Pulse 85  Ht 5\' 2"  (1.575 m)  Wt 148 lb (67.132 kg)  BMI 27.06 kg/m2  SpO2 99%  Wt Readings from Last 3 Encounters:  10/05/15 148 lb (67.132 kg)  10/03/14 141 lb 6.4 oz (64.139 kg)  02/27/14 165 lb (74.844 kg)    Physical Exam  Constitutional: She is oriented to person, place, and time. She appears well-developed.  HENT:  Head: Normocephalic and atraumatic.  Eyes: EOM are normal.  Neck: Normal range of motion. Neck supple. No tracheal deviation present. No thyromegaly present.  Cardiovascular: Normal rate and regular rhythm.   Pulmonary/Chest: Effort normal and breath sounds normal.  Abdominal: Soft. Bowel sounds are normal. There is no tenderness. There is no guarding.  Musculoskeletal: Normal range of motion. She exhibits no edema.  Neurological: She is alert and oriented to person, place, and time. She has normal reflexes. No cranial nerve deficit. Coordination normal.  Skin: Skin is warm and dry. No rash noted. No erythema. No pallor.  Psychiatric: She has a normal mood and affect. Judgment normal.    CMP     Component Value Date/Time   NA 136 02/28/2011 1447   K 3.5 02/28/2011 1447   CL 104 02/28/2011 1447   CO2 21 02/28/2011 1447   GLUCOSE 45*  02/28/2011 1447   BUN 9 02/28/2011 1447   CREATININE 0.63 02/28/2011 1447   CALCIUM 9.1 02/28/2011 1447   PROT 5.5* 02/28/2011 1447   ALBUMIN 2.2* 02/28/2011 1447   AST 21 02/28/2011 1447   ALT 10 02/28/2011 1447   ALKPHOS 315* 02/28/2011 1447   BILITOT 0.5 02/28/2011 1447   GFRNONAA >60 02/28/2011 1447   GFRAA >60 02/28/2011 1447     Diabetic Labs (most recent): Lab Results  Component Value Date   HGBA1C 9.2* 07/22/2015   HGBA1C 10.6* 10/22/2014      Assessment & Plan:   1. Other specified diabetes mellitus without complication, with long-term current use of insulin (HCC)  - Patient has currently uncontrolled symptomatic type ( ?1) DM since  35 years of age,  with most recent A1c of 9.2%. Recent labs reviewed, showing normal renal function. -Given history of requiring up to 120 units of insulin a day, body weight up to 165 pounds, absence of history of diabetic ketoacidosis, and history of being managed with oral medications including glipizide and metformin may indicate a possibility of type 2 diabetes with insulin resistance. See below. -she remains at a high  risk for more acute and chronic complications of diabetes which include CAD, CVA, CKD, retinopathy, and neuropathy. These are all discussed in detail with the patient.  - I have counseled the patient on diet management and weight loss, by adopting a carbohydrate restricted/protein rich diet.  - Suggestion is made for patient to avoid simple carbohydrates   from their diet including Cakes , Desserts, Ice Cream,  Soda (  diet and regular) , Sweet Tea , Candies,  Chips, Cookies, Artificial Sweeteners,   and "Sugar-free" Products . This will help patient to have stable blood glucose profile and potentially avoid unintended weight gain.  - I encouraged the patient to switch to  unprocessed or minimally processed complex starch and increased protein intake (animal or plant source), fruits, and vegetables.  - Patient is advised  to stick to a routine mealtimes to eat 3 meals  a day and avoid unnecessary snacks ( to snack only to correct hypoglycemia).  - The patient will be scheduled with Norm SaltPenny Crumpton, RDN, CDE for individualized DM education.  - I have approached patient with the following individualized plan to manage diabetes and patient agrees:   - I  will proceed to readjust her basal insulin Lantus to 30 units QHS, and prandial insulin NovoLog 10 units TIDAC for pre-meal BG readings of 90-150mg /dl, plus patient specific correction dose for unexpected hyperglycemia above 150mg /dl, associated with strict monitoring of glucose  AC and HS. - Patient is warned not to take insulin without proper monitoring per orders. -Adjustment parameters are given for hypo and hyperglycemia in writing. -Patient is encouraged to call clinic for blood glucose levels less than 70 or above 300 mg /dl.  -She may benefit from low-dose metformin, to be considered after next visit. - Patient specific target  A1c;  LDL, HDL, Triglycerides, and  Waist Circumference were discussed in detail.  2) BP/HTN: Controlled.  3) Lipids/HPL: Controlled unknown, will obtain lipid panel on subsequent visits. 4)  Weight/Diet: CDE Consult will be initiated , exercise, and detailed carbohydrates information provided.  5) Chronic Care/Health Maintenance:  -Patient is  encouraged to continue to follow up with Ophthalmology, Podiatrist at least yearly or according to recommendations, and advised to   stay away from smoking. I have recommended yearly flu vaccine and pneumonia vaccination at least every 5 years; moderate intensity exercise for up to 150 minutes weekly; and  sleep for at least 7 hours a day.  - 60 minutes of time was spent on the care of this patient , 50% of which was applied for counseling on diabetes complications and their preventions.  - Patient to bring meter and  blood glucose logs during their next visit.   - I advised patient to  maintain close follow up with Tami HectorNYLAND,LEONARD ROBERT, MD for primary care needs.  Follow up plan: - Return in about 1 week (around 10/12/2015) for diabetes, follow up with pre-visit labs, meter, and logs, follow up with meter and logs- no labs.  Marquis LunchGebre Nida, MD Phone: [phone]  Fax: [phone]   10/05/2015, 3:37 PM

## 2015-10-06 ENCOUNTER — Ambulatory Visit (INDEPENDENT_AMBULATORY_CARE_PROVIDER_SITE_OTHER): Payer: BLUE CROSS/BLUE SHIELD | Admitting: Obstetrics & Gynecology

## 2015-10-06 ENCOUNTER — Encounter: Payer: Self-pay | Admitting: Obstetrics & Gynecology

## 2015-10-06 VITALS — BP 120/70 | HR 76 | Wt 150.0 lb

## 2015-10-06 DIAGNOSIS — Z01419 Encounter for gynecological examination (general) (routine) without abnormal findings: Secondary | ICD-10-CM | POA: Diagnosis not present

## 2015-10-06 MED ORDER — ETONOGESTREL-ETHINYL ESTRADIOL 0.12-0.015 MG/24HR VA RING: VAGINAL_RING | VAGINAL | Status: DC

## 2015-10-06 MED ORDER — PIROXICAM 20 MG PO CAPS: 20.0000 mg | ORAL_CAPSULE | Freq: Every day | ORAL | Status: DC

## 2015-10-06 NOTE — Progress Notes (Signed)
Patient ID: [name], female   DOB: 09/03/1981, 35 y.o.   MRN: [id number] Subjective:     [name] is a 35 y.o. female here for a routine exam.  Patient's last menstrual period was 08/21/2015. No obstetric history on file. Birth Control Method:  nuvaring Menstrual Calendar(currently): regular  Current complaints: high blood sugars.   Current acute medical issues:  Diabetes poorly controlled   Recent Gynecologic History Patient's last menstrual period was 08/21/2015. Last Pap: 2015,  normal Last mammogram: ,    Past Medical History  Diagnosis Date  . IBS (irritable bowel syndrome)     constipation  . Family history of malignant neoplasm of gastrointestinal tract   . Constipation   . Gastroparesis     Past Surgical History  Procedure Laterality Date  . Tonsillectomy  1992  . Eye surgery  1989  . Cholecystectomy  2007  . Cesarean section      OB History    No data available      Social History   Social History  . Marital Status: Single    Spouse Name: N/A  . Number of Children: N/A  . Years of Education: N/A   Social History Main Topics  . Smoking status: Former Smoker    Types: Cigarettes  . Smokeless tobacco: Never Used  . Alcohol Use: No  . Drug Use: No  . Sexual Activity: Not Currently    Birth Control/ Protection: Inserts   Other Topics Concern  . None   Social History Narrative    Family History  Problem Relation Age of Onset  . Colon cancer Paternal Grandfather      Current outpatient prescriptions:  .  escitalopram (LEXAPRO) 20 MG tablet, Take 20 mg by mouth daily., Disp: , Rfl:  .  gabapentin (NEURONTIN) 300 MG capsule, Take 1 capsule (300 mg total) by mouth 3 (three) times daily., Disp: 90 capsule, Rfl: 3 .  insulin aspart (NOVOLOG) 100 UNIT/ML injection, Inject 10-16 Units into the skin 3 (three) times daily with meals., Disp: 10 mL, Rfl: 2 .  insulin glargine (LANTUS) 100 UNIT/ML injection, Inject 0.3 mLs (30 Units total)  into the skin at bedtime., Disp: 10 mL, Rfl: 3 .  LORazepam (ATIVAN) 0.5 MG tablet, TAKE ONE TABLET BY MOUTH AS NEEDED FOR ANXIETY, Disp: 60 tablet, Rfl: 3 .  valACYclovir (VALTREX) 1000 MG tablet, 1 tablet daily, Disp: 30 tablet, Rfl: 11  Review of Systems  Review of Systems  Constitutional: Negative for fever, chills, weight loss, malaise/fatigue and diaphoresis.  HENT: Negative for hearing loss, ear pain, nosebleeds, congestion, sore throat, neck pain, tinnitus and ear discharge.   Eyes: Negative for blurred vision, double vision, photophobia, pain, discharge and redness.  Respiratory: Negative for cough, hemoptysis, sputum production, shortness of breath, wheezing and stridor.   Cardiovascular: Negative for chest pain, palpitations, orthopnea, claudication, leg swelling and PND.  Gastrointestinal: negative for abdominal pain. Negative for heartburn, nausea, vomiting, diarrhea, constipation, blood in stool and melena.  Genitourinary: Negative for dysuria, urgency, frequency, hematuria and flank pain.  Musculoskeletal: Negative for myalgias, back pain, joint pain and falls.  Skin: Negative for itching and rash.  Neurological: Negative for dizziness, tingling, tremors, sensory change, speech change, focal weakness, seizures, loss of consciousness, weakness and headaches.  Endo/Heme/Allergies: Negative for environmental allergies and polydipsia. Does not bruise/bleed easily.  Psychiatric/Behavioral: Negative for depression, suicidal ideas, hallucinations, memory loss and substance abuse. The patient is not nervous/anxious and does not have insomnia.  Objective:  Blood pressure 120/70, pulse 76, weight 150 lb (68.04 kg), last menstrual period 08/21/2015.   Physical Exam  Vitals reviewed. Constitutional: She is oriented to person, place, and time. She appears well-developed and well-nourished.  HENT:  Head: Normocephalic and atraumatic.        Right Ear: External ear normal.  Left  Ear: External ear normal.  Nose: Nose normal.  Mouth/Throat: Oropharynx is clear and moist.  Eyes: Conjunctivae and EOM are normal. Pupils are equal, round, and reactive to light. Right eye exhibits no discharge. Left eye exhibits no discharge. No scleral icterus.  Neck: Normal range of motion. Neck supple. No tracheal deviation present. No thyromegaly present.  Cardiovascular: Normal rate, regular rhythm, normal heart sounds and intact distal pulses.  Exam reveals no gallop and no friction rub.   No murmur heard. Respiratory: Effort normal and breath sounds normal. No respiratory distress. She has no wheezes. She has no rales. She exhibits no tenderness.  GI: Soft. Bowel sounds are normal. She exhibits no distension and no mass. There is no tenderness. There is no rebound and no guarding.  Genitourinary:  Breasts no masses skin changes or nipple changes bilaterally      Vulva is normal without lesions Vagina is pink moist without discharge Cervix normal in appearance and pap is done Uterus is normal size shape and contour Adnexa is negative with normal sized ovaries   Musculoskeletal: Normal range of motion. She exhibits no edema and no tenderness.  Neurological: She is alert and oriented to person, place, and time. She has normal reflexes. She displays normal reflexes. No cranial nerve deficit. She exhibits normal muscle tone. Coordination normal.  Skin: Skin is warm and dry. No rash noted. No erythema. No pallor.  Psychiatric: She has a normal mood and affect. Her behavior is normal. Judgment and thought content normal.       Assessment:    Healthy female exam.    Plan:    Contraception: NuvaRing vaginal inserts. Follow up in: 1 year. refer to Dr Fransico Him

## 2015-10-07 ENCOUNTER — Telehealth: Payer: Self-pay | Admitting: *Deleted

## 2015-10-07 NOTE — Telephone Encounter (Signed)
Pt informed work note left at front desk for pick up.

## 2015-10-07 NOTE — Telephone Encounter (Signed)
Ok to give note to excuse from work

## 2015-10-08 ENCOUNTER — Other Ambulatory Visit: Payer: Self-pay | Admitting: Obstetrics & Gynecology

## 2015-10-14 ENCOUNTER — Ambulatory Visit: Payer: BLUE CROSS/BLUE SHIELD | Admitting: "Endocrinology

## 2015-10-28 ENCOUNTER — Ambulatory Visit: Payer: BLUE CROSS/BLUE SHIELD | Admitting: "Endocrinology

## 2015-11-11 ENCOUNTER — Ambulatory Visit: Payer: BLUE CROSS/BLUE SHIELD | Admitting: Obstetrics & Gynecology

## 2015-11-11 ENCOUNTER — Encounter: Payer: Self-pay | Admitting: Obstetrics & Gynecology

## 2015-11-25 ENCOUNTER — Telehealth: Payer: Self-pay | Admitting: Obstetrics & Gynecology

## 2015-11-25 ENCOUNTER — Other Ambulatory Visit: Payer: Self-pay | Admitting: Advanced Practice Midwife

## 2015-11-25 MED ORDER — VALACYCLOVIR HCL 1 G PO TABS: 1000.0000 mg | ORAL_TABLET | Freq: Every day | ORAL | Status: DC

## 2015-11-25 NOTE — Telephone Encounter (Signed)
Pt requesting refill on Valtrex.

## 2015-11-25 NOTE — Telephone Encounter (Signed)
I sent it in with 6 refills

## 2015-12-17 ENCOUNTER — Encounter: Payer: Self-pay | Admitting: Gastroenterology

## 2016-01-16 ENCOUNTER — Encounter (HOSPITAL_BASED_OUTPATIENT_CLINIC_OR_DEPARTMENT_OTHER): Payer: Self-pay | Admitting: Emergency Medicine

## 2016-01-16 ENCOUNTER — Emergency Department (HOSPITAL_BASED_OUTPATIENT_CLINIC_OR_DEPARTMENT_OTHER)
Admission: EM | Admit: 2016-01-16 | Discharge: 2016-01-17 | Disposition: A | Discharge status: Home / Self Care | Payer: Self-pay | Attending: Emergency Medicine | Admitting: Emergency Medicine

## 2016-01-16 DIAGNOSIS — Z794 Long term (current) use of insulin: Secondary | ICD-10-CM | POA: Insufficient documentation

## 2016-01-16 DIAGNOSIS — W0110XA Fall on same level from slipping, tripping and stumbling with subsequent striking against unspecified object, initial encounter: Secondary | ICD-10-CM | POA: Insufficient documentation

## 2016-01-16 DIAGNOSIS — W19XXXA Unspecified fall, initial encounter: Secondary | ICD-10-CM

## 2016-01-16 DIAGNOSIS — W548XXA Other contact with dog, initial encounter: Secondary | ICD-10-CM

## 2016-01-16 DIAGNOSIS — Y929 Unspecified place or not applicable: Secondary | ICD-10-CM | POA: Insufficient documentation

## 2016-01-16 DIAGNOSIS — T148 Other injury of unspecified body region: Secondary | ICD-10-CM | POA: Insufficient documentation

## 2016-01-16 DIAGNOSIS — Y939 Activity, unspecified: Secondary | ICD-10-CM | POA: Insufficient documentation

## 2016-01-16 DIAGNOSIS — Y999 Unspecified external cause status: Secondary | ICD-10-CM | POA: Insufficient documentation

## 2016-01-16 DIAGNOSIS — S0181XA Laceration without foreign body of other part of head, initial encounter: Secondary | ICD-10-CM | POA: Insufficient documentation

## 2016-01-16 DIAGNOSIS — S80211A Abrasion, right knee, initial encounter: Secondary | ICD-10-CM | POA: Insufficient documentation

## 2016-01-16 DIAGNOSIS — Z79899 Other long term (current) drug therapy: Secondary | ICD-10-CM | POA: Insufficient documentation

## 2016-01-16 DIAGNOSIS — T07XXXA Unspecified multiple injuries, initial encounter: Secondary | ICD-10-CM

## 2016-01-16 DIAGNOSIS — Z87891 Personal history of nicotine dependence: Secondary | ICD-10-CM | POA: Insufficient documentation

## 2016-01-16 DIAGNOSIS — S0083XA Contusion of other part of head, initial encounter: Secondary | ICD-10-CM

## 2016-01-16 MED ORDER — SODIUM CHLORIDE 0.9 % IV SOLN: INTRAVENOUS | Status: DC
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
## 2016-01-16 NOTE — ED Notes (Signed)
Pt in c/o hematoma to head after tripping and falling on concrete. Pain to R knee and head. Hematoma noted, denies anticoagulants or LOC. Neuro intact.

## 2016-01-17 ENCOUNTER — Emergency Department (HOSPITAL_BASED_OUTPATIENT_CLINIC_OR_DEPARTMENT_OTHER): Payer: Self-pay

## 2016-01-17 MED ORDER — HYDROCODONE-ACETAMINOPHEN 5-325 MG PO TABS: 1.0000 | ORAL_TABLET | Freq: Four times a day (QID) | ORAL | Status: DC | PRN

## 2016-01-17 MED ORDER — HYDROCODONE-ACETAMINOPHEN 5-325 MG PO TABS
1.0000 | ORAL_TABLET | Freq: Once | ORAL | Status: AC
  Administered 2016-01-17: 1 via ORAL
  Filled 2016-01-17: qty 1

## 2016-01-17 MED ORDER — ONDANSETRON 8 MG PO TBDP
8.0000 mg | ORAL_TABLET | Freq: Once | ORAL | Status: AC
  Administered 2016-01-17: 8 mg via ORAL
  Filled 2016-01-17: qty 1

## 2016-01-17 MED ORDER — ONDANSETRON 8 MG PO TBDP: 8.0000 mg | ORAL_TABLET | Freq: Three times a day (TID) | ORAL | Status: DC | PRN

## 2016-02-03 ENCOUNTER — Telehealth: Payer: Self-pay | Admitting: Obstetrics & Gynecology

## 2016-02-03 MED ORDER — MEGESTROL ACETATE 40 MG PO TABS: ORAL_TABLET | ORAL | Status: DC

## 2016-02-03 NOTE — Telephone Encounter (Signed)
Pt states Dr. Despina HiddenEure started her on birth control pills to help regulate period, started period this past Sunday, today vaginal bleeding increased with clots the size of fist, changing pad every 30 min-1 hour.   Pt also requesting excuse for work note until Monday, May 22,2017. .Marland Kitchen

## 2016-02-03 NOTE — Telephone Encounter (Signed)
Pt called stating that she would like to speak with Dr. Forestine ChuteEure's nurse because she has a question. Pt did not state the nature of the question. Please contact pt

## 2016-02-29 ENCOUNTER — Telehealth: Payer: Self-pay | Admitting: Obstetrics & Gynecology

## 2016-02-29 MED ORDER — CIPROFLOXACIN HCL 500 MG PO TABS: 500.0000 mg | ORAL_TABLET | Freq: Two times a day (BID) | ORAL | Status: DC

## 2016-02-29 NOTE — Telephone Encounter (Signed)
Pt states Thursday evening, started having burning with urination and pressure, had Amoxicillin at home and started taking Saturday (2 tab x 2 days) with AZO/Tylenol. Completed all the Amoxicillin she had. Pt requesting prescription for antibiotic.  Offered pt an appt for urine culture but pt states she does not have any insurance at this time. Please advise.

## 2016-05-19 ENCOUNTER — Other Ambulatory Visit: Payer: Self-pay | Admitting: Obstetrics & Gynecology

## 2016-05-30 ENCOUNTER — Telehealth: Payer: Self-pay | Admitting: Obstetrics & Gynecology

## 2016-05-30 ENCOUNTER — Other Ambulatory Visit: Payer: Self-pay | Admitting: Obstetrics & Gynecology

## 2016-05-30 MED ORDER — CIPROFLOXACIN HCL 500 MG PO TABS: 500.0000 mg | ORAL_TABLET | Freq: Two times a day (BID) | ORAL | 0 refills | Status: DC

## 2016-05-30 NOTE — Telephone Encounter (Signed)
Pt c/o burning with urination, pt is requesting an ABX for UTI.  Pt states she does have an appt with Dr.Eure tomorrow and we can send urine for culture at that time.

## 2016-05-31 ENCOUNTER — Encounter: Payer: Self-pay | Admitting: Obstetrics & Gynecology

## 2016-05-31 ENCOUNTER — Ambulatory Visit (INDEPENDENT_AMBULATORY_CARE_PROVIDER_SITE_OTHER): Payer: BLUE CROSS/BLUE SHIELD | Admitting: Obstetrics & Gynecology

## 2016-05-31 VITALS — BP 120/70 | HR 80 | Wt 145.0 lb

## 2016-05-31 DIAGNOSIS — N920 Excessive and frequent menstruation with regular cycle: Secondary | ICD-10-CM

## 2016-05-31 DIAGNOSIS — N946 Dysmenorrhea, unspecified: Secondary | ICD-10-CM

## 2016-05-31 DIAGNOSIS — N941 Unspecified dyspareunia: Secondary | ICD-10-CM

## 2016-05-31 NOTE — Progress Notes (Signed)
      Chief Complaint  Patient presents with  . Follow-up    discuss long term birth control    Blood pressure 120/70, pulse 80, weight 145 lb (65.8 kg), last menstrual period 05/09/2016.  35 y.o. No obstetric history on file. Patient's last menstrual period was 05/09/2016. The current method of family planning is tubal ligation.  Subjective Pt with worsening periods, been bad getting worse Heavy cramps, diarrhea Clots soils clothes sheets, doubles up pads Has dyspareunia especially after intercourse  Objective   Pertinent ROS diabetes  Labs or studies     Impression Diagnoses this Encounter::   ICD-9-CM ICD-10-CM   1. Menorrhagia with regular cycle 626.2 N92.0 US Pelvis Complete     US Transvaginal Non-OB  2. Dysmenorrhea 625.3 N94.6 US Pelvis Complete     US Transvaginal Non-OB  3. Dyspareunia, female 625.0 N94.10 US Pelvis Complete     US Transvaginal Non-OB    Established relevant diagnosis(es): diabetes  Plan/Recommendations: No orders of the defined types were placed in this encounter.   Labs or Scans Ordered: Orders Placed This Encounter  Procedures  . US Pelvis Complete  . US Transvaginal Non-OB    Management:: Megestrol Sonogram Probably proceed with abdominal hysterectomy including removal of tubes and cervix  Follow up Return in about 2 weeks (around 06/14/2016) for GYN sono, Follow up, with Dr Despina HiddenEure.        Face to face time:  15 minutes  Greater than 50% of the visit time was spent in counseling and coordination of care with the patient.  The summary and outline of the counseling and care coordination is summarized in the note above.   All questions were answered.

## 2016-06-15 ENCOUNTER — Ambulatory Visit (INDEPENDENT_AMBULATORY_CARE_PROVIDER_SITE_OTHER): Payer: Medicaid Other | Admitting: Obstetrics & Gynecology

## 2016-06-15 ENCOUNTER — Ambulatory Visit (INDEPENDENT_AMBULATORY_CARE_PROVIDER_SITE_OTHER): Payer: Medicaid Other

## 2016-06-15 ENCOUNTER — Encounter: Payer: Self-pay | Admitting: Obstetrics & Gynecology

## 2016-06-15 VITALS — BP 130/90 | HR 80 | Wt 144.0 lb

## 2016-06-15 DIAGNOSIS — N946 Dysmenorrhea, unspecified: Secondary | ICD-10-CM

## 2016-06-15 DIAGNOSIS — R102 Pelvic and perineal pain: Secondary | ICD-10-CM | POA: Diagnosis not present

## 2016-06-15 DIAGNOSIS — N941 Unspecified dyspareunia: Secondary | ICD-10-CM | POA: Diagnosis not present

## 2016-06-15 DIAGNOSIS — N920 Excessive and frequent menstruation with regular cycle: Secondary | ICD-10-CM | POA: Diagnosis not present

## 2016-06-15 NOTE — Progress Notes (Signed)
Follow up appointment for results  Chief Complaint  Patient presents with  . Follow-up    ultrasound    Blood pressure 130/90, pulse 80, weight 144 lb (65.3 kg), last menstrual period 06/12/2016.  [place] Transvaginal Non-ob  Result Date: 06/15/2016 GYNECOLOGIC SONOGRAM [name] is a 35 y.o. LMP 06/12/2016 for a pelvic sonogram for menorrhagia,dysmenorrhea,and dyspareunia. Uterus                      7.3 x 3.4 x 4.1 cm, homogeneous uterus wnl Endometrium          5.9 mm, symmetrical, wnl Right ovary             2.7 x 2.6 x 2.6 cm, wnl Left ovary                3.0 x 1.8 x 1.7 cm, wnl Technician Comments: PELVIC US TA/TV: Homogeneous uterus wnl,normal ovaries bilat,EEC 5.9 mm,no free fluid,ov's appear to be mobile,bilat adnexal discomfort during ultrasound [employer] Carl 06/15/2016 2:12 PM Clinical Impression and recommendations: I have reviewed the sonogram results above, combined with the patient's current clinical course, below are my impressions and any appropriate recommendations for management based on the sonographic findings. Normal uterus endometrium and ovaries No anatomical etiology is noted for her symptoms Lazaro ArmsURE,Caly Pellum H 06/15/2016 2:34 PM   [place] Pelvis Complete  Result Date: 06/15/2016 GYNECOLOGIC SONOGRAM Tami MendVictoria N [name] is a 35 y.o. LMP 06/12/2016 for a pelvic sonogram for menorrhagia,dysmenorrhea,and dyspareunia. Uterus                      7.3 x 3.4 x 4.1 cm, homogeneous uterus wnl Endometrium          5.9 mm, symmetrical, wnl Right ovary             2.7 x 2.6 x 2.6 cm, wnl Left ovary                3.0 x 1.8 x 1.7 cm, wnl Technician Comments: PELVIC US TA/TV: Homogeneous uterus wnl,normal ovaries bilat,EEC 5.9 mm,no free fluid,ov's appear to be mobile,bilat adnexal discomfort during ultrasound [employer] Carl 06/15/2016 2:12 PM Clinical Impression and recommendations: I have reviewed the sonogram results above, combined with the patient's current clinical course, below are my impressions and  any appropriate recommendations for management based on the sonographic findings. Normal uterus endometrium and ovaries No anatomical etiology is noted for her symptoms Haeden Hudock H 06/15/2016 2:34 PM      MEDS ordered this encounter: No orders of the defined types were placed in this encounter.   Orders for this encounter: No orders of the defined types were placed in this encounter.   Impression: Menorrhagia with regular cycle  Dysmenorrhea  Dyspareunia, female    Plan: Pt continues to have really daily pain now, 60% of the time, megace caused her period to be lighter at first but her pain is worse, she has not had intercourse Her pain is midline and lateral, meaning her adnexa/ovaries are involved She understands since she has not responded to megestrol and she has chronic pelvic pain, endometrial ablation is not a viable option  Understands TAHBSO is the only option that will address all the issues(2 previous C sections no descent, need to remove cerivx  Bump dyspareunia) She understands she will need post op ERT  Follow Up: Return in about 3 weeks (around 07/06/2016) for Post Op, with Dr Despina HiddenEure.  Face to face time:  15 minutes  Greater than 50% of the visit time was spent in counseling and coordination of care with the patient.  The summary and outline of the counseling and care coordination is summarized in the note above.   All questions were answered.  Past Medical History:  Diagnosis Date  . Constipation   . Family history of malignant neoplasm of gastrointestinal tract   . Gastroparesis   . IBS (irritable bowel syndrome)    constipation    Past Surgical History:  Procedure Laterality Date  . CESAREAN SECTION    . CHOLECYSTECTOMY  2007  . EYE SURGERY  1989  . TONSILLECTOMY  1992    OB History    No data available      Allergies  Allergen Reactions  . Celebrex [Celecoxib]   . Latex   . Sulfonamide Derivatives     Social History    Social History  . Marital status: Single    Spouse name: N/A  . Number of children: N/A  . Years of education: N/A   Social History Main Topics  . Smoking status: Former Smoker    Types: Cigarettes  . Smokeless tobacco: Never Used  . Alcohol use No  . Drug use: No  . Sexual activity: Not Currently    Birth control/ protection: Inserts   Other Topics Concern  . None   Social History Narrative  . None    Family History  Problem Relation Age of Onset  . Colon cancer Paternal Grandfather

## 2016-06-15 NOTE — Progress Notes (Signed)
PELVIC US TA/TV: Homogeneous uterus wnl,normal ovaries bilat,EEC 5.9 mm,no free fluid,ov's appear to be mobile,bilat adnexal discomfort during ultrasound

## 2016-06-17 ENCOUNTER — Telehealth: Payer: Self-pay | Admitting: *Deleted

## 2016-06-17 NOTE — Telephone Encounter (Signed)
Pt c/o abdominal pain, cramps, requesting pain med. Pt states Motrin, Ibuprofen not helping.  Pt states she saw Dr. Despina HiddenEure on 06/15/2016 but forgot to ask for pain med.   Pt informed Dr.Eure not in the office today will be back on Monday. Pt verbalized understanding.

## 2016-06-20 ENCOUNTER — Other Ambulatory Visit: Payer: Self-pay | Admitting: Obstetrics & Gynecology

## 2016-06-20 MED ORDER — HYDROCODONE-ACETAMINOPHEN 5-325 MG PO TABS
1.0000 | ORAL_TABLET | Freq: Four times a day (QID) | ORAL | 0 refills | Status: DC | PRN
Start: 2016-06-20 — End: 2016-06-30

## 2016-06-23 NOTE — Patient Instructions (Signed)
[name]  06/23/2016     @PREFPERIOPPHARMACY @   Your procedure is scheduled on  06/29/2016   Report to [facility] at  700  A.M.  Call this number if you have problems the morning of surgery:  [phone]   Remember:  Do not eat food or drink liquids after midnight.  Take these medicines the morning of surgery with A SIP OF WATER  Lexapro,gabapentin, hydrocodone, ativan, valtrex. Take 1/2 of your usual insulin dosage the night before your surgery. DO NOT take any medications for your diabetes the morning of surgery.   Do not wear jewelry, make-up or nail polish.  Do not wear lotions, powders, or perfumes, or deoderant.  Do not shave 48 hours prior to surgery.  Men may shave face and neck.  Do not bring valuables to the hospital.  [facility] is not responsible for any belongings or valuables.  Contacts, dentures or bridgework may not be worn into surgery.  Leave your suitcase in the car.  After surgery it may be brought to your room.  For patients admitted to the hospital, discharge time will be determined by your treatment team.  Patients discharged the day of surgery will not be allowed to drive home.   Name and phone number of your driver:   family Special instructions:  none  Please read over the following fact sheets that you were given. Anesthesia Post-op Instructions and Care and Recovery After Surgery       Abdominal Hysterectomy Abdominal hysterectomy is a surgery to remove your womb (uterus). Your womb is the part of your body that contains a growing baby. The surgery may be done for many reasons. These may include cancer, growths (tumors), long-term pain, or bleeding. You may also need other reproductive parts removed during this surgery. This will depend on why you need to have the surgery. BEFORE THE PROCEDURE  Talk to your doctor about the changes to your body. These changes may be physical and emotional.  You may need to  have blood work done. You may also need X-rays done.  Quit smoking if you smoke. Ask your doctor for help.  Stop taking medicines that thin your blood as told by your doctor.  Your doctor may have you take other medicines. Take all medicines as told by your doctor.  Do not eat or drink anything for 6-8 hours before surgery.  Take your normal medicines with a small sip of water.  Shower or take a bath the night or morning before surgery. PROCEDURE  This surgery is done in the hospital.  You are given a medicine that makes you go to sleep (general anesthetic).  The doctor will make a cut (incision) through the skin in your lower belly.  The cut may be about 5-7 inches long. It may go side-to-side or up-and-down.  The doctor will move the body tissue that covers your womb. The doctor will carefully remove your womb. The doctor may remove any other reproductive parts that need to be removed.  The doctor will use clamps or stitches (sutures) to control bleeding.  The doctor will close your cut with stitches or metal clips. AFTER THE PROCEDURE  You will have pain right after the procedure.  You will be given pain medicine in the recovery room.  You will be taken to your hospital room after the medicines that made you go to sleep wear  off.  You will be told how to take care of yourself at home.   This information is not intended to replace advice given to you by your health care provider. Make sure you discuss any questions you have with your health care provider.   Document Released: 09/10/2013 Document Reviewed: 09/10/2013 Elsevier Interactive Patient Education 2016 [employer] Abdominal Hysterectomy, Care After Refer to this sheet in the next few weeks. These instructions provide you with information on caring for yourself after your procedure. Your health care provider may also give you more specific instructions. Your treatment has been planned according to current medical  practices, but problems sometimes occur. Call your health care provider if you have any problems or questions after your procedure.  WHAT TO EXPECT AFTER THE PROCEDURE After your procedure, it is typical to have the following:  Pain.  Feeling tired.  Poor appetite.  Less interest in sex. It takes 4-6 weeks to recover from this surgery.  HOME CARE INSTRUCTIONS   Take pain medicines only as directed by your health care provider. Do not take over-the-counter pain medicines without checking with your health care provider first.  Change your bandage as directed by your health care provider.  Return to your health care provider to have your sutures taken out.  Take showers instead of baths for 2-3 weeks. Ask your health care provider when it is safe to start showering.  Do not douche, use tampons, or have sexual intercourse for at least 6 weeks or until your health care provider says you can.   Follow your health care provider's advice about exercise, lifting, driving, and general activities.  Get plenty of rest and sleep.   Do not lift anything heavier than a gallon of milk (about 10 lb [4.5 kg]) for the first month after surgery.  You can resume your normal diet if your health care provider says it is okay.   Do not drink alcohol until your health care provider says you can.   If you are constipated, ask your health care provider if you can take a mild laxative.  Eating foods high in fiber may also help with constipation. Eat plenty of raw fruits and vegetables, whole grains, and beans.  Drink enough fluids to keep your urine clear or pale yellow.   Try to have someone at home with you for the first 1-2 weeks to help around the house.  Keep all follow-up appointments. SEEK MEDICAL CARE IF:   You have chills or fever.  You have swelling, redness, or pain in the area of your incision that is getting worse.   You have pus coming from the incision.   You notice a  bad smell coming from the incision or bandage.   Your incision breaks open.   You feel dizzy or light-headed.   You have pain or bleeding when you urinate.   You have persistent diarrhea.   You have persistent nausea and vomiting.   You have abnormal vaginal discharge.   You have a rash.   You have any type of abnormal reaction or develop an allergy to your medicine.   Your pain medicine is not helping.  SEEK IMMEDIATE MEDICAL CARE IF:   You have a fever and your symptoms suddenly get worse.  You have severe abdominal pain.  You have chest pain.  You have shortness of breath.  You faint.  You have pain, swelling, or redness of your leg.  You have heavy vaginal bleeding with blood clots.  MAKE SURE YOU:  Understand these instructions.  Will watch your condition.  Will get help right away if you are not doing well or get worse.   This information is not intended to replace advice given to you by your health care provider. Make sure you discuss any questions you have with your health care provider.   Document Released: 03/25/2005 Document Revised: 09/26/2014 Document Reviewed: 06/28/2013 Elsevier Interactive Patient Education 2016 [employer]  Bilateral Salpingo-Oophorectomy Bilateral salpingo-oophorectomy is the surgical removal of both fallopian tubes and both ovaries. The ovaries are small organs that produce eggs in women. The fallopian tubes transport the egg from the ovary to the womb (uterus). Usually, when this surgery is done, the uterus was previously removed. A bilateral salpingo-oophorectomy may be done to treat cancer or to reduce the risk of cancer in women who are at high risk. Removing both fallopian tubes and both ovaries will make you unable to become pregnant (sterile). It will also put you into menopause so that you will no longer have menstrual periods and may have menopausal symptoms such as hot flashes, night sweats, and mood changes.  It will not affect your sex drive. LET [facility] CARE PROVIDER KNOW ABOUT:  Any allergies you have.  All medicines you are taking, including vitamins, herbs, eye drops, creams, and over-the-counter medicines.  Previous problems you or members of your family have had with the use of anesthetics.  Any blood disorders you have.  Previous surgeries you have had.  Medical conditions you have. RISKS AND COMPLICATIONS Generally, this is a safe procedure. However, as with any procedure, complications can occur. Possible complications include:  Injury to surrounding organs.  Bleeding.  Infection.  Blood clots in the legs or lungs.  Problems related to anesthesia. BEFORE THE PROCEDURE  Ask your health care provider about changing or stopping your regular medicines. You may need to stop taking certain medicines, such as aspirin or blood thinners, at least 1 week before the surgery.  Do not eat or drink anything for at least 8 hours before the surgery.  If you smoke, do not smoke for at least 2 weeks before the surgery.  Make plans to have someone drive you home after the procedure or after your hospital stay. Also arrange for someone to help you with activities during recovery. PROCEDURE   You will be given medicine to help you relax before the procedure (sedative). You will then be given medicine to make you sleep through the procedure (general anesthetic). These medicines will be given through an IV access tube that is put into one of your veins.  Once you are asleep, your lower abdomen will be shaved and cleaned. A thin, flexible tube (catheter) will be placed in your bladder.  The surgeon may use a laparoscopic, robotic, or open technique for this surgery:  In the laparoscopic technique, the surgery is done through two small cuts (incisions) in the abdomen. A thin, lighted tube with a tiny camera on the end (laparoscope) is inserted into one of the incisions. The tools needed  for the procedure are put through the other incision.  A robotic technique may be chosen to perform complex surgery in a small space. In the robotic technique, small incisions will be made. A camera and surgical instruments are passed through the incisions. Surgical instruments will be controlled with the help of a robotic arm.  In the open technique, the surgery is done through one large incision in the abdomen.  Using any  of these techniques, the surgeon removes the fallopian tubes and ovaries. The blood vessels will be clamped and tied.  The surgeon then uses staples or stitches to close the incision or incisions. AFTER THE PROCEDURE  You will be taken to a recovery area where you will be monitored for 1 to 3 hours. Your blood pressure, pulse, and temperature will be checked often. You will remain in the recovery area until you are stable and waking up.  If the laparoscopic technique was used, you may be allowed to go home after several hours. You may have some shoulder pain after the laparoscopic procedure. This is normal and usually goes away in a day or two.  If the open technique was used, you will be admitted to the hospital for a couple of days.  You will be given pain medicine as needed.  The IV access tube and catheter will be removed before you are discharged.   This information is not intended to replace advice given to you by your health care provider. Make sure you discuss any questions you have with your health care provider.   Document Released: 09/05/2005 Document Revised: 09/10/2013 Document Reviewed: 02/27/2013 Elsevier Interactive Patient Education 2016 [employer] Bilateral Salpingo-Oophorectomy, Care After Refer to this sheet in the next few weeks. These instructions provide you with information on caring for yourself after your procedure. Your health care provider may also give you more specific instructions. Your treatment has been planned according to current  medical practices, but problems sometimes occur. Call your health care provider if you have any problems or questions after your procedure. WHAT TO EXPECT AFTER THE PROCEDURE After your procedure, it is typical to have the following:   Abdominal pain that can be controlled with medicine.  Vaginal spotting.  Constipation.  Menopausal symptoms such as hot flashes, vaginal dryness, and mood swings. HOME CARE INSTRUCTIONS   Get plenty of rest and sleep.  Only take over-the-counter or prescription medicines as directed by your health care provider. Do not take aspirin. It can cause bleeding.  Keep incision areas clean and dry. Remove or change bandages (dressings) only as directed by your health care provider.  Take showers instead of baths for a few weeks as directed by your health care provider.  Limit exercise and activities as directed by your health care provider. Do not lift anything heavier than 5 pounds (2.3 kg) until your health care provider approves.  Do not drive until your health care provider approves.  Follow your health care provider's advice regarding diet. You may be able to resume your usual diet right away.  Drink enough fluids to keep your urine clear or pale yellow.  Do not douche, use tampons, or have sexual intercourse for 6 weeks after the procedure.  Do not drink alcohol until your health care provider says it is okay.  Take your temperature twice a day and write it down.  If you become constipated, you may:  Ask your health care provider about taking a mild laxative.  Add more fruit and bran to your diet.  Drink more fluids.  Follow up with your health care provider as directed. SEEK MEDICAL CARE IF:   You have swelling, redness, or increasing pain in the incision area.  You see pus coming from the incision area.  You notice a bad smell coming from the wound or dressing.  You have pain, redness, or swelling where the IV access tube was  placed.  Your incision is breaking open (  the edges are not staying together).  You feel dizzy or feel like fainting.  You develop pain or bleeding when you urinate.  You develop diarrhea.  You develop nausea and vomiting.  You develop abnormal vaginal discharge.  You develop a rash.  You have pain that is not controlled with medicine. SEEK IMMEDIATE MEDICAL CARE IF:   You develop a fever.  You develop abdominal pain.  You have chest pain.  You develop shortness of breath.  You pass out.  You develop pain, swelling, or redness in your leg.  You develop heavy vaginal bleeding with or without blood clots.   This information is not intended to replace advice given to you by your health care provider. Make sure you discuss any questions you have with your health care provider.   Document Released: 09/05/2005 Document Revised: 05/08/2013 Document Reviewed: 02/27/2013 Elsevier Interactive Patient Education 2016 [employer]. General Anesthesia, Adult General anesthesia is a sleep-like state of non-feeling produced by medicines (anesthetics). General anesthesia prevents you from being alert and feeling pain during a medical procedure. Your caregiver may recommend general anesthesia if your procedure:  Is long.  Is painful or uncomfortable.  Would be frightening to see or hear.  Requires you to be still.  Affects your breathing.  Causes significant blood loss. LET YOUR CAREGIVER KNOW ABOUT:  Allergies to food or medicine.  Medicines taken, including vitamins, herbs, eyedrops, over-the-counter medicines, and creams.  Use of steroids (by mouth or creams).  Previous problems with anesthetics or numbing medicines, including problems experienced by relatives.  History of bleeding problems or blood clots.  Previous surgeries and types of anesthetics received.  Possibility of pregnancy, if this applies.  Use of cigarettes, alcohol, or illegal drugs.  Any health  condition(s), especially diabetes, sleep apnea, and high blood pressure. RISKS AND COMPLICATIONS General anesthesia rarely causes complications. However, if complications do occur, they can be life threatening. Complications include:  A lung infection.  A stroke.  A heart attack.  Waking up during the procedure. When this occurs, the patient may be unable to move and communicate that he or she is awake. The patient may feel severe pain. Older adults and adults with serious medical problems are more likely to have complications than adults who are young and healthy. Some complications can be prevented by answering all of your caregiver's questions thoroughly and by following all pre-procedure instructions. It is important to tell your caregiver if any of the pre-procedure instructions, especially those related to diet, were not followed. Any food or liquid in the stomach can cause problems when you are under general anesthesia. BEFORE THE PROCEDURE  Ask your caregiver if you will have to spend the night at the hospital. If you will not have to spend the night, arrange to have an adult drive you and stay with you for 24 hours.  Follow your caregiver's instructions if you are taking dietary supplements or medicines. Your caregiver may tell you to stop taking them or to reduce your dosage.  Do not smoke for as long as possible before your procedure. If possible, stop smoking 3-6 weeks before the procedure.  Do not take new dietary supplements or medicines within 1 week of your procedure unless your caregiver approves them.  Do not eat within 8 hours of your procedure or as directed by your caregiver. Drink only clear liquids, such as water, black coffee (without milk or cream), and fruit juices (without pulp).  Do not drink within 3 hours of  your procedure or as directed by your caregiver.  You may brush your teeth on the morning of the procedure, but make sure to spit out the toothpaste and  water when finished. PROCEDURE  You will receive anesthetics through a mask, through an intravenous (IV) access tube, or through both. A doctor who specializes in anesthesia (anesthesiologist) or a nurse who specializes in anesthesia (nurse anesthetist) or both will stay with you throughout the procedure to make sure you remain unconscious. He or she will also watch your blood pressure, pulse, and oxygen levels to make sure that the anesthetics do not cause any problems. Once you are asleep, a breathing tube or mask may be used to help you breathe. AFTER THE PROCEDURE You will wake up after the procedure is complete. You may be in the room where the procedure was performed or in a recovery area. You may have a sore throat if a breathing tube was used. You may also feel:  Dizzy.  Weak.  Drowsy.  Confused.  Nauseous.  Cold. These are all normal responses and can be expected to last for up to 24 hours after the procedure is complete. A caregiver will tell you when you are ready to go home. This will usually be when you are fully awake and in stable condition.   This information is not intended to replace advice given to you by your health care provider. Make sure you discuss any questions you have with your health care provider.   Document Released: 12/13/2007 Document Revised: 09/26/2014 Document Reviewed: 01/04/2012 Elsevier Interactive Patient Education 2016 [employer]. General Anesthesia, Adult, Care After Refer to this sheet in the next few weeks. These instructions provide you with information on caring for yourself after your procedure. Your health care provider may also give you more specific instructions. Your treatment has been planned according to current medical practices, but problems sometimes occur. Call your health care provider if you have any problems or questions after your procedure. WHAT TO EXPECT AFTER THE PROCEDURE After the procedure, it is typical to  experience:  Sleepiness.  Nausea and vomiting. HOME CARE INSTRUCTIONS  For the first 24 hours after general anesthesia:  Have a responsible person with you.  Do not drive a car. If you are alone, do not take public transportation.  Do not drink alcohol.  Do not take medicine that has not been prescribed by your health care provider.  Do not sign important papers or make important decisions.  You may resume a normal diet and activities as directed by your health care provider.  Change bandages (dressings) as directed.  If you have questions or problems that seem related to general anesthesia, call the hospital and ask for the anesthetist or anesthesiologist on call. SEEK MEDICAL CARE IF:  You have nausea and vomiting that continue the day after anesthesia.  You develop a rash. SEEK IMMEDIATE MEDICAL CARE IF:   You have difficulty breathing.  You have chest pain.  You have any allergic problems.   This information is not intended to replace advice given to you by your health care provider. Make sure you discuss any questions you have with your health care provider.   Document Released: 12/12/2000 Document Revised: 09/26/2014 Document Reviewed: 01/04/2012 Elsevier Interactive Patient Education 2016 [employer]. PATIENT INSTRUCTIONS POST-ANESTHESIA  IMMEDIATELY FOLLOWING SURGERY:  Do not drive or operate machinery for the first twenty four hours after surgery.  Do not make any important decisions for twenty four hours after surgery or  while taking narcotic pain medications or sedatives.  If you develop intractable nausea and vomiting or a severe headache please notify your doctor immediately.  FOLLOW-UP:  Please make an appointment with your surgeon as instructed. You do not need to follow up with anesthesia unless specifically instructed to do so.  WOUND CARE INSTRUCTIONS (if applicable):  Keep a dry clean dressing on the anesthesia/puncture wound site if there is  drainage.  Once the wound has quit draining you may leave it open to air.  Generally you should leave the bandage intact for twenty four hours unless there is drainage.  If the epidural site drains for more than 36-48 hours please call the anesthesia department.  QUESTIONS?:  Please feel free to call your physician or the hospital operator if you have any questions, and they will be happy to assist you.

## 2016-06-24 ENCOUNTER — Encounter (HOSPITAL_COMMUNITY): Payer: Self-pay

## 2016-06-24 ENCOUNTER — Other Ambulatory Visit: Payer: Self-pay | Admitting: Obstetrics & Gynecology

## 2016-06-24 ENCOUNTER — Encounter (HOSPITAL_COMMUNITY)
Admission: RE | Admit: 2016-06-24 | Discharge: 2016-06-24 | Disposition: A | Discharge status: Home / Self Care | Payer: Medicaid Other | Source: Ambulatory Visit | Attending: Obstetrics & Gynecology | Admitting: Obstetrics & Gynecology

## 2016-06-24 DIAGNOSIS — Z01812 Encounter for preprocedural laboratory examination: Secondary | ICD-10-CM | POA: Insufficient documentation

## 2016-06-24 HISTORY — DX: Anemia, unspecified: D64.9

## 2016-06-24 HISTORY — DX: Type 2 diabetes mellitus with diabetic neuropathy, unspecified: E11.40

## 2016-06-24 HISTORY — DX: Anxiety disorder, unspecified: F41.9

## 2016-06-24 HISTORY — DX: Type 2 diabetes mellitus without complications: E11.9

## 2016-06-24 LAB — CBC
HEMATOCRIT: 44.6 % (ref 36.0–46.0)
HEMOGLOBIN: 14.7 g/dL (ref 12.0–15.0)
MCH: 29.2 pg (ref 26.0–34.0)
MCHC: 33 g/dL (ref 30.0–36.0)
MCV: 88.5 fL (ref 78.0–100.0)
Platelets: 310 10*3/uL (ref 150–400)
RBC: 5.04 MIL/uL (ref 3.87–5.11)
RDW: 13.4 % (ref 11.5–15.5)
WBC: 7.2 10*3/uL (ref 4.0–10.5)

## 2016-06-24 LAB — URINALYSIS, ROUTINE W REFLEX MICROSCOPIC
Bilirubin Urine: NEGATIVE
Glucose, UA: 500 mg/dL — AB
HGB URINE DIPSTICK: NEGATIVE
LEUKOCYTES UA: NEGATIVE
Nitrite: NEGATIVE
PROTEIN: NEGATIVE mg/dL
SPECIFIC GRAVITY, URINE: 1.015 (ref 1.005–1.030)
pH: 6.5 (ref 5.0–8.0)

## 2016-06-24 LAB — SURGICAL PCR SCREEN
MRSA, PCR: NEGATIVE
STAPHYLOCOCCUS AUREUS: POSITIVE — AB

## 2016-06-24 LAB — TYPE AND SCREEN
ABO/RH(D): O POS
Antibody Screen: NEGATIVE

## 2016-06-24 LAB — HCG, QUANTITATIVE, PREGNANCY

## 2016-06-24 NOTE — Progress Notes (Signed)
Pt is a Type 2 Diabetic.  Pt is to take full dose of Lantus the night before.  No insulin the morning of surgery.  We will check blood sugar on arrival andcover as needed.  Dr. Marcos EkeGonzales notified.

## 2016-06-25 LAB — COMPREHENSIVE METABOLIC PANEL
ALK PHOS: 141 U/L — AB (ref 38–126)
ALT: 243 U/L — ABNORMAL HIGH (ref 14–54)
ANION GAP: 11 (ref 5–15)
AST: 117 U/L — ABNORMAL HIGH (ref 15–41)
Albumin: 4.4 g/dL (ref 3.5–5.0)
BILIRUBIN TOTAL: 0.5 mg/dL (ref 0.3–1.2)
BUN: 12 mg/dL (ref 6–20)
CALCIUM: 9.8 mg/dL (ref 8.9–10.3)
CO2: 24 mmol/L (ref 22–32)
Chloride: 102 mmol/L (ref 101–111)
Creatinine, Ser: 0.71 mg/dL (ref 0.44–1.00)
GLUCOSE: 209 mg/dL — AB (ref 65–99)
POTASSIUM: 4.3 mmol/L (ref 3.5–5.1)
Sodium: 137 mmol/L (ref 135–145)
TOTAL PROTEIN: 7.6 g/dL (ref 6.5–8.1)

## 2016-06-27 MED ORDER — MUPIROCIN 2 % EX OINT
TOPICAL_OINTMENT | CUTANEOUS | Status: AC
  Filled 2016-06-27: qty 22

## 2016-06-29 ENCOUNTER — Inpatient Hospital Stay (HOSPITAL_COMMUNITY): Payer: Medicaid Other | Admitting: Anesthesiology

## 2016-06-29 ENCOUNTER — Encounter (HOSPITAL_COMMUNITY)
Admission: RE | Disposition: A | Discharge status: Home / Self Care | Payer: Self-pay | Source: Ambulatory Visit | Attending: Obstetrics & Gynecology

## 2016-06-29 ENCOUNTER — Inpatient Hospital Stay (HOSPITAL_COMMUNITY)
Admission: RE | Admit: 2016-06-29 | Discharge: 2016-06-30 | DRG: 743 | Disposition: A | Discharge status: Home / Self Care | Payer: Medicaid Other | Source: Ambulatory Visit | Attending: Obstetrics & Gynecology | Admitting: Obstetrics & Gynecology

## 2016-06-29 ENCOUNTER — Encounter (HOSPITAL_COMMUNITY): Payer: Self-pay | Admitting: *Deleted

## 2016-06-29 DIAGNOSIS — Z87891 Personal history of nicotine dependence: Secondary | ICD-10-CM

## 2016-06-29 DIAGNOSIS — N941 Unspecified dyspareunia: Secondary | ICD-10-CM | POA: Diagnosis present

## 2016-06-29 DIAGNOSIS — Z23 Encounter for immunization: Secondary | ICD-10-CM | POA: Diagnosis not present

## 2016-06-29 DIAGNOSIS — Z800 Family history of malignant neoplasm of digestive organs: Secondary | ICD-10-CM

## 2016-06-29 DIAGNOSIS — N8301 Follicular cyst of right ovary: Secondary | ICD-10-CM

## 2016-06-29 DIAGNOSIS — N946 Dysmenorrhea, unspecified: Secondary | ICD-10-CM | POA: Diagnosis present

## 2016-06-29 DIAGNOSIS — N921 Excessive and frequent menstruation with irregular cycle: Secondary | ICD-10-CM | POA: Diagnosis present

## 2016-06-29 DIAGNOSIS — Z9079 Acquired absence of other genital organ(s): Secondary | ICD-10-CM

## 2016-06-29 DIAGNOSIS — F418 Other specified anxiety disorders: Secondary | ICD-10-CM | POA: Diagnosis present

## 2016-06-29 DIAGNOSIS — Z90722 Acquired absence of ovaries, bilateral: Secondary | ICD-10-CM

## 2016-06-29 DIAGNOSIS — Z419 Encounter for procedure for purposes other than remedying health state, unspecified: Secondary | ICD-10-CM

## 2016-06-29 DIAGNOSIS — Z9049 Acquired absence of other specified parts of digestive tract: Secondary | ICD-10-CM

## 2016-06-29 DIAGNOSIS — E1143 Type 2 diabetes mellitus with diabetic autonomic (poly)neuropathy: Secondary | ICD-10-CM | POA: Diagnosis present

## 2016-06-29 DIAGNOSIS — K219 Gastro-esophageal reflux disease without esophagitis: Secondary | ICD-10-CM | POA: Diagnosis present

## 2016-06-29 DIAGNOSIS — Z794 Long term (current) use of insulin: Secondary | ICD-10-CM

## 2016-06-29 DIAGNOSIS — N72 Inflammatory disease of cervix uteri: Secondary | ICD-10-CM | POA: Diagnosis not present

## 2016-06-29 DIAGNOSIS — Z90710 Acquired absence of both cervix and uterus: Secondary | ICD-10-CM

## 2016-06-29 DIAGNOSIS — N920 Excessive and frequent menstruation with regular cycle: Secondary | ICD-10-CM | POA: Diagnosis not present

## 2016-06-29 DIAGNOSIS — N8302 Follicular cyst of left ovary: Secondary | ICD-10-CM

## 2016-06-29 HISTORY — PX: ABDOMINAL HYSTERECTOMY: SHX81

## 2016-06-29 HISTORY — PX: SALPINGOOPHORECTOMY: SHX82

## 2016-06-29 LAB — GLUCOSE, CAPILLARY
GLUCOSE-CAPILLARY: 141 mg/dL — AB (ref 65–99)
GLUCOSE-CAPILLARY: 244 mg/dL — AB (ref 65–99)
GLUCOSE-CAPILLARY: 236 mg/dL — AB (ref 65–99)
Glucose-Capillary: 213 mg/dL — ABNORMAL HIGH (ref 65–99)

## 2016-06-29 SURGERY — HYSTERECTOMY, ABDOMINAL
Anesthesia: General

## 2016-06-29 MED ORDER — PROMETHAZINE HCL 25 MG/ML IJ SOLN
INTRAMUSCULAR | Status: AC
  Filled 2016-06-29: qty 1

## 2016-06-29 MED ORDER — MIDAZOLAM HCL 2 MG/2ML IJ SOLN
1.0000 mg | INTRAMUSCULAR | Status: DC | PRN
  Administered 2016-06-29: 2 mg via INTRAVENOUS

## 2016-06-29 MED ORDER — LACTATED RINGERS IV SOLN
INTRAVENOUS | Status: DC
  Administered 2016-06-29 (×2): via INTRAVENOUS
  Administered 2016-06-29: 1000 mL via INTRAVENOUS

## 2016-06-29 MED ORDER — INSULIN ASPART 100 UNIT/ML ~~LOC~~ SOLN
10.0000 [IU] | Freq: Three times a day (TID) | SUBCUTANEOUS | Status: DC
  Administered 2016-06-29 – 2016-06-30 (×3): 10 [IU] via SUBCUTANEOUS

## 2016-06-29 MED ORDER — SUFENTANIL CITRATE 50 MCG/ML IV SOLN
INTRAVENOUS | Status: AC
  Filled 2016-06-29: qty 1

## 2016-06-29 MED ORDER — CEFAZOLIN SODIUM-DEXTROSE 2-4 GM/100ML-% IV SOLN
INTRAVENOUS | Status: AC
  Filled 2016-06-29: qty 100

## 2016-06-29 MED ORDER — NEOSTIGMINE METHYLSULFATE 10 MG/10ML IV SOLN
INTRAVENOUS | Status: AC
  Filled 2016-06-29: qty 1

## 2016-06-29 MED ORDER — METOPROLOL TARTRATE 5 MG/5ML IV SOLN
INTRAVENOUS | Status: AC
  Filled 2016-06-29: qty 5

## 2016-06-29 MED ORDER — BUPIVACAINE LIPOSOME 1.3 % IJ SUSP
INTRAMUSCULAR | Status: AC
  Filled 2016-06-29: qty 20

## 2016-06-29 MED ORDER — ESCITALOPRAM OXALATE 10 MG PO TABS
20.0000 mg | ORAL_TABLET | Freq: Every day | ORAL | Status: DC
  Administered 2016-06-30: 20 mg via ORAL
  Filled 2016-06-29: qty 2

## 2016-06-29 MED ORDER — HYDROMORPHONE HCL 1 MG/ML IJ SOLN
0.2500 mg | INTRAMUSCULAR | Status: AC | PRN
  Administered 2016-06-29 (×8): 0.5 mg via INTRAVENOUS
  Filled 2016-06-29 (×2): qty 1

## 2016-06-29 MED ORDER — SODIUM CHLORIDE 0.9 % IV SOLN
8.0000 mg | Freq: Four times a day (QID) | INTRAVENOUS | Status: DC | PRN
  Filled 2016-06-29: qty 4

## 2016-06-29 MED ORDER — LIDOCAINE HCL (CARDIAC) 20 MG/ML IV SOLN
INTRAVENOUS | Status: DC | PRN
  Administered 2016-06-29: 40 mg via INTRAVENOUS

## 2016-06-29 MED ORDER — DEXAMETHASONE SODIUM PHOSPHATE 4 MG/ML IJ SOLN
4.0000 mg | Freq: Once | INTRAMUSCULAR | Status: AC
  Administered 2016-06-29: 4 mg via INTRAVENOUS

## 2016-06-29 MED ORDER — PROMETHAZINE HCL 25 MG/ML IJ SOLN
12.5000 mg | Freq: Once | INTRAMUSCULAR | Status: AC
  Administered 2016-06-29: 12.5 mg via INTRAVENOUS

## 2016-06-29 MED ORDER — HYDROMORPHONE HCL 1 MG/ML IJ SOLN
INTRAMUSCULAR | Status: AC
  Filled 2016-06-29: qty 1

## 2016-06-29 MED ORDER — SUFENTANIL CITRATE 50 MCG/ML IV SOLN
INTRAVENOUS | Status: DC | PRN
  Administered 2016-06-29: 10 ug via INTRAVENOUS
  Administered 2016-06-29: 5 ug via INTRAVENOUS
  Administered 2016-06-29 (×4): 10 ug via INTRAVENOUS
  Administered 2016-06-29: 5 ug via INTRAVENOUS
  Administered 2016-06-29 (×2): 10 ug via INTRAVENOUS

## 2016-06-29 MED ORDER — MIDAZOLAM HCL 2 MG/2ML IJ SOLN
INTRAMUSCULAR | Status: AC
  Filled 2016-06-29: qty 2

## 2016-06-29 MED ORDER — ROCURONIUM BROMIDE 100 MG/10ML IV SOLN
INTRAVENOUS | Status: DC | PRN
  Administered 2016-06-29: 35 mg via INTRAVENOUS

## 2016-06-29 MED ORDER — CEFAZOLIN SODIUM-DEXTROSE 2-4 GM/100ML-% IV SOLN
2.0000 g | INTRAVENOUS | Status: AC
  Administered 2016-06-29: 2 g via INTRAVENOUS

## 2016-06-29 MED ORDER — GLYCOPYRROLATE 0.2 MG/ML IJ SOLN
INTRAMUSCULAR | Status: DC | PRN
  Administered 2016-06-29: 0.6 mg via INTRAVENOUS

## 2016-06-29 MED ORDER — VALACYCLOVIR HCL 500 MG PO TABS
1000.0000 mg | ORAL_TABLET | Freq: Every day | ORAL | Status: DC
  Administered 2016-06-30: 1000 mg via ORAL
  Filled 2016-06-29 (×2): qty 2

## 2016-06-29 MED ORDER — OXYCODONE-ACETAMINOPHEN 5-325 MG PO TABS
1.0000 | ORAL_TABLET | ORAL | Status: DC | PRN
  Administered 2016-06-29 – 2016-06-30 (×4): 2 via ORAL
  Filled 2016-06-29 (×4): qty 2

## 2016-06-29 MED ORDER — GABAPENTIN 300 MG PO CAPS
300.0000 mg | ORAL_CAPSULE | Freq: Three times a day (TID) | ORAL | Status: DC
  Administered 2016-06-29 – 2016-06-30 (×3): 300 mg via ORAL
  Filled 2016-06-29 (×3): qty 1

## 2016-06-29 MED ORDER — SODIUM CHLORIDE 0.9 % IJ SOLN
INTRAMUSCULAR | Status: AC
  Filled 2016-06-29: qty 10

## 2016-06-29 MED ORDER — BUPIVACAINE LIPOSOME 1.3 % IJ SUSP: INTRAMUSCULAR | Status: DC | PRN

## 2016-06-29 MED ORDER — HYDROMORPHONE HCL 1 MG/ML IJ SOLN
0.2500 mg | INTRAMUSCULAR | Status: DC | PRN
  Administered 2016-06-29 (×3): 0.5 mg via INTRAVENOUS
  Filled 2016-06-29 (×2): qty 1

## 2016-06-29 MED ORDER — METOPROLOL TARTRATE 5 MG/5ML IV SOLN
3.0000 mg | Freq: Once | INTRAVENOUS | Status: AC
  Administered 2016-06-29: 3 mg via INTRAVENOUS

## 2016-06-29 MED ORDER — LORAZEPAM 2 MG/ML IJ SOLN
INTRAMUSCULAR | Status: AC
  Filled 2016-06-29: qty 1

## 2016-06-29 MED ORDER — KETOROLAC TROMETHAMINE 30 MG/ML IJ SOLN
INTRAMUSCULAR | Status: AC
  Filled 2016-06-29: qty 1

## 2016-06-29 MED ORDER — ONDANSETRON HCL 4 MG PO TABS
8.0000 mg | ORAL_TABLET | Freq: Four times a day (QID) | ORAL | Status: DC | PRN
  Administered 2016-06-30: 8 mg via ORAL
  Filled 2016-06-29: qty 2

## 2016-06-29 MED ORDER — ARTIFICIAL TEARS OP OINT
TOPICAL_OINTMENT | OPHTHALMIC | Status: AC
  Filled 2016-06-29: qty 7

## 2016-06-29 MED ORDER — ONDANSETRON HCL 4 MG/2ML IJ SOLN
INTRAMUSCULAR | Status: AC
  Filled 2016-06-29: qty 2

## 2016-06-29 MED ORDER — ONDANSETRON HCL 4 MG/2ML IJ SOLN
4.0000 mg | Freq: Once | INTRAMUSCULAR | Status: AC
  Administered 2016-06-29: 4 mg via INTRAVENOUS

## 2016-06-29 MED ORDER — DOCUSATE SODIUM 100 MG PO CAPS
100.0000 mg | ORAL_CAPSULE | Freq: Two times a day (BID) | ORAL | Status: DC
  Administered 2016-06-29 – 2016-06-30 (×3): 100 mg via ORAL
  Filled 2016-06-29 (×3): qty 1

## 2016-06-29 MED ORDER — BISACODYL 10 MG RE SUPP: 10.0000 mg | Freq: Every day | RECTAL | Status: DC | PRN

## 2016-06-29 MED ORDER — ONDANSETRON HCL 4 MG/2ML IJ SOLN
INTRAMUSCULAR | Status: DC | PRN
  Administered 2016-06-29: 4 mg via INTRAVENOUS

## 2016-06-29 MED ORDER — SODIUM CHLORIDE 0.9 % IR SOLN
Status: DC | PRN
  Administered 2016-06-29: 2000 mL/h

## 2016-06-29 MED ORDER — KCL IN DEXTROSE-NACL 20-5-0.45 MEQ/L-%-% IV SOLN
INTRAVENOUS | Status: DC
  Administered 2016-06-29 – 2016-06-30 (×3): via INTRAVENOUS

## 2016-06-29 MED ORDER — PROPOFOL 10 MG/ML IV BOLUS
INTRAVENOUS | Status: AC
  Filled 2016-06-29: qty 40

## 2016-06-29 MED ORDER — INSULIN GLARGINE 100 UNIT/ML ~~LOC~~ SOLN
30.0000 [IU] | Freq: Every day | SUBCUTANEOUS | Status: DC
  Administered 2016-06-29: 30 [IU] via SUBCUTANEOUS
  Filled 2016-06-29 (×2): qty 0.3

## 2016-06-29 MED ORDER — ZOLPIDEM TARTRATE 5 MG PO TABS
5.0000 mg | ORAL_TABLET | Freq: Every evening | ORAL | Status: DC | PRN
  Administered 2016-06-29: 5 mg via ORAL
  Filled 2016-06-29: qty 1

## 2016-06-29 MED ORDER — NEOSTIGMINE METHYLSULFATE 10 MG/10ML IV SOLN
INTRAVENOUS | Status: DC | PRN
  Administered 2016-06-29: 4 mg via INTRAVENOUS

## 2016-06-29 MED ORDER — ALUM & MAG HYDROXIDE-SIMETH 200-200-20 MG/5ML PO SUSP: 30.0000 mL | ORAL | Status: DC | PRN

## 2016-06-29 MED ORDER — HYDROMORPHONE HCL 1 MG/ML IJ SOLN
1.0000 mg | INTRAMUSCULAR | Status: DC | PRN
  Administered 2016-06-29 (×2): 2 mg via INTRAVENOUS
  Filled 2016-06-29 (×2): qty 2

## 2016-06-29 MED ORDER — INFLUENZA VAC SPLIT QUAD 0.5 ML IM SUSY
0.5000 mL | PREFILLED_SYRINGE | INTRAMUSCULAR | Status: AC
  Administered 2016-06-30: 0.5 mL via INTRAMUSCULAR
  Filled 2016-06-29: qty 0.5

## 2016-06-29 MED ORDER — SUCCINYLCHOLINE CHLORIDE 20 MG/ML IJ SOLN
INTRAMUSCULAR | Status: AC
  Filled 2016-06-29: qty 3

## 2016-06-29 MED ORDER — GLYCOPYRROLATE 0.2 MG/ML IJ SOLN
INTRAMUSCULAR | Status: AC
  Filled 2016-06-29: qty 3

## 2016-06-29 MED ORDER — HYDROMORPHONE HCL 1 MG/ML IJ SOLN: 0.5000 mg | INTRAMUSCULAR | Status: DC | PRN

## 2016-06-29 MED ORDER — KETOROLAC TROMETHAMINE 30 MG/ML IJ SOLN
30.0000 mg | Freq: Once | INTRAMUSCULAR | Status: AC
  Administered 2016-06-29: 30 mg via INTRAVENOUS

## 2016-06-29 MED ORDER — SODIUM CHLORIDE 0.9% FLUSH
INTRAVENOUS | Status: AC
Start: 2016-06-29 — End: 2016-06-29
  Filled 2016-06-29: qty 10

## 2016-06-29 MED ORDER — DEXAMETHASONE SODIUM PHOSPHATE 4 MG/ML IJ SOLN
INTRAMUSCULAR | Status: AC
  Filled 2016-06-29: qty 1

## 2016-06-29 MED ORDER — LORAZEPAM 1 MG PO TABS
1.0000 mg | ORAL_TABLET | Freq: Two times a day (BID) | ORAL | Status: DC
  Administered 2016-06-29 – 2016-06-30 (×3): 1 mg via ORAL
  Filled 2016-06-29 (×3): qty 1

## 2016-06-29 MED ORDER — INSULIN ASPART 100 UNIT/ML ~~LOC~~ SOLN: 10.0000 [IU] | Freq: Three times a day (TID) | SUBCUTANEOUS | Status: DC

## 2016-06-29 MED ORDER — PROPOFOL 10 MG/ML IV BOLUS
INTRAVENOUS | Status: DC | PRN
  Administered 2016-06-29: 120 mg via INTRAVENOUS

## 2016-06-29 MED ORDER — SENNOSIDES-DOCUSATE SODIUM 8.6-50 MG PO TABS: 1.0000 | ORAL_TABLET | Freq: Every evening | ORAL | Status: DC | PRN

## 2016-06-29 SURGICAL SUPPLY — 56 items
APPLIER CLIP 13 LRG OPEN (CLIP)
BAG HAMPER (MISCELLANEOUS) ×4 IMPLANT
CELLS DAT CNTRL 66122 CELL SVR (MISCELLANEOUS) ×2 IMPLANT
CLIP APPLIE 13 LRG OPEN (CLIP) IMPLANT
CLOTH BEACON ORANGE TIMEOUT ST (SAFETY) ×4 IMPLANT
COVER LIGHT HANDLE STERIS (MISCELLANEOUS) ×8 IMPLANT
DRAPE WARM FLUID 44X44 (DRAPE) ×4 IMPLANT
DRSG OPSITE POSTOP 4X8 (GAUZE/BANDAGES/DRESSINGS) ×4 IMPLANT
DRSG TELFA 3X8 NADH (GAUZE/BANDAGES/DRESSINGS) ×4 IMPLANT
ELECT REM PT RETURN 9FT ADLT (ELECTROSURGICAL) ×4
ELECTRODE REM PT RTRN 9FT ADLT (ELECTROSURGICAL) ×2 IMPLANT
FORMALIN 10 PREFIL 480ML (MISCELLANEOUS) ×4 IMPLANT
GAUZE SPONGE 4X4 12PLY STRL (GAUZE/BANDAGES/DRESSINGS) ×4 IMPLANT
GLOVE BIOGEL PI IND STRL 6.5 (GLOVE) ×12 IMPLANT
GLOVE BIOGEL PI IND STRL 7.0 (GLOVE) ×2 IMPLANT
GLOVE BIOGEL PI IND STRL 8 (GLOVE) ×6 IMPLANT
GLOVE BIOGEL PI INDICATOR 6.5 (GLOVE) ×12
GLOVE BIOGEL PI INDICATOR 7.0 (GLOVE) ×2
GLOVE BIOGEL PI INDICATOR 8 (GLOVE) ×6
GLOVE ECLIPSE 8.0 STRL XLNG CF (GLOVE) ×4 IMPLANT
GLOVE INDICATOR 7.0 STRL GRN (GLOVE) ×8 IMPLANT
GLOVE INDICATOR 8.0 STRL GRN (GLOVE) ×4 IMPLANT
GOWN STRL REUS W/TWL LRG LVL3 (GOWN DISPOSABLE) ×8 IMPLANT
GOWN STRL REUS W/TWL XL LVL3 (GOWN DISPOSABLE) ×4 IMPLANT
INST SET MAJOR GENERAL (KITS) ×4 IMPLANT
KIT ROOM TURNOVER APOR (KITS) ×4 IMPLANT
LIQUID BAND (GAUZE/BANDAGES/DRESSINGS) ×4 IMPLANT
MANIFOLD NEPTUNE II (INSTRUMENTS) ×4 IMPLANT
NEEDLE HYPO 18GX1.5 BLUNT FILL (NEEDLE) ×4 IMPLANT
NEEDLE HYPO 21X1.5 SAFETY (NEEDLE) ×4 IMPLANT
NS IRRIG 1000ML POUR BTL (IV SOLUTION) ×8 IMPLANT
PACK ABDOMINAL MAJOR (CUSTOM PROCEDURE TRAY) ×4 IMPLANT
PAD ABD 5X9 TENDERSORB (GAUZE/BANDAGES/DRESSINGS) ×8 IMPLANT
PAD ARMBOARD 7.5X6 YLW CONV (MISCELLANEOUS) ×4 IMPLANT
RETRACTOR WND ALEXIS 25 LRG (MISCELLANEOUS) IMPLANT
RTRCTR WOUND ALEXIS 18CM MED (MISCELLANEOUS) ×4
RTRCTR WOUND ALEXIS 25CM LRG (MISCELLANEOUS)
SET BASIN LINEN APH (SET/KITS/TRAYS/PACK) ×4 IMPLANT
SPONGE LAP 18X18 X RAY DECT (DISPOSABLE) ×8 IMPLANT
STAPLER VISISTAT 35W (STAPLE) ×4 IMPLANT
SUT CHROMIC 0 CT 1 (SUTURE) ×4 IMPLANT
SUT CHROMIC 2 0 CT 1 (SUTURE) ×16 IMPLANT
SUT MON AB 3-0 SH 27 (SUTURE) IMPLANT
SUT PLAIN 2 0 XLH (SUTURE) ×4 IMPLANT
SUT PROLENE 0 CT 1 30 (SUTURE) ×4 IMPLANT
SUT PROLENE 1 (SUTURE) ×8 IMPLANT
SUT VIC AB 0 CT1 27 (SUTURE) ×8
SUT VIC AB 0 CT1 27XBRD ANTBC (SUTURE) ×2 IMPLANT
SUT VIC AB 0 CT1 27XCR 8 STRN (SUTURE) ×6 IMPLANT
SUT VIC AB 0 CTX 36 (SUTURE) ×2
SUT VIC AB 0 CTX36XBRD ANTBCTR (SUTURE) ×2 IMPLANT
SUT VICRYL 3 0 (SUTURE) ×4 IMPLANT
SYR 20CC LL (SYRINGE) ×8 IMPLANT
TOWEL BLUE STERILE X RAY DET (MISCELLANEOUS) ×4 IMPLANT
TRAY FOLEY CATH SILVER 16FR (SET/KITS/TRAYS/PACK) ×4 IMPLANT
TRAY FOLEY W/METER SILVER 16FR (SET/KITS/TRAYS/PACK) ×4 IMPLANT

## 2016-06-29 NOTE — Transfer of Care (Signed)
Immediate Anesthesia Transfer of Care Note  Patient: [name]  Procedure(s) Performed: Procedure(s): TOTAL ABDOMINAL HYSTERECTOMY (N/A) BILATERAL SALPINGO OOPHORECTOMY (Bilateral)  Patient Location: PACU  Anesthesia Type:General  Level of Consciousness: awake, alert , oriented and patient cooperative  Airway & Oxygen Therapy: Patient Spontanous Breathing and Patient connected to face mask oxygen  Post-op Assessment: Report given to RN and Post -op Vital signs reviewed and stable  Post vital signs: Reviewed and stable  Last Vitals:  Vitals:   06/29/16 0820 06/29/16 0825  BP: 123/80 108/75  Resp: 17 20  Temp:      Last Pain:  Vitals:   06/29/16 0724  TempSrc: Oral      Patients Stated Pain Goal: 8 (06/29/16 0724)  Complications: No apparent anesthesia complications

## 2016-06-29 NOTE — Anesthesia Preprocedure Evaluation (Signed)
Anesthesia Evaluation  Patient identified by MRN, date of birth, ID band Patient awake    Reviewed: Allergy & Precautions, NPO status , Patient's Chart, lab work & pertinent test results  Airway Mallampati: I  TM Distance: >3 FB     Dental  (+) Teeth Intact   Pulmonary neg pulmonary ROS, former smoker,    breath sounds clear to auscultation       Cardiovascular negative cardio ROS   Rhythm:Regular Rate:Normal     Neuro/Psych PSYCHIATRIC DISORDERS Anxiety Depression    GI/Hepatic GERD  Medicated and Controlled,  Endo/Other  diabetes, Type 2, Insulin Dependent  Renal/GU      Musculoskeletal   Abdominal   Peds  Hematology  (+) anemia ,   Anesthesia Other Findings   Reproductive/Obstetrics                             Anesthesia Physical Anesthesia Plan  ASA: III  Anesthesia Plan: General   Post-op Pain Management:    Induction: Intravenous  Airway Management Planned: Oral ETT  Additional Equipment:   Intra-op Plan:   Post-operative Plan: Extubation in OR  Informed Consent: I have reviewed the patients History and Physical, chart, labs and discussed the procedure including the risks, benefits and alternatives for the proposed anesthesia with the patient or authorized representative who has indicated his/her understanding and acceptance.     Plan Discussed with:   Anesthesia Plan Comments:         Anesthesia Quick Evaluation

## 2016-06-29 NOTE — Anesthesia Postprocedure Evaluation (Signed)
Anesthesia Post Note  Patient: [name]  Procedure(s) Performed: Procedure(s) (LRB): TOTAL ABDOMINAL HYSTERECTOMY (N/A) BILATERAL SALPINGO OOPHORECTOMY (Bilateral)  Patient location during evaluation: PACU Anesthesia Type: General Level of consciousness: awake and alert Pain management: pain level controlled (Patient given several doses of dilaudid for pain relief in  PACU) Vital Signs Assessment: post-procedure vital signs reviewed and stable Respiratory status: spontaneous breathing Cardiovascular status: stable Anesthetic complications: no    Last Vitals:  Vitals:   06/29/16 1115 06/29/16 1130  BP: (!) 145/97 (!) 166/105  Pulse: (!) 112 (!) 128  Resp: (!) 22 16  Temp:      Last Pain:  Vitals:   06/29/16 1137  TempSrc:   PainSc: 10-Worst pain ever                 Kelson Queenan A

## 2016-06-29 NOTE — Anesthesia Procedure Notes (Signed)
Procedure Name: Intubation Date/Time: 06/29/2016 8:40 AM Performed by: Pernell DupreADAMS, [name] Pre-anesthesia Checklist: Patient identified, Patient being monitored, Timeout performed, Emergency Drugs available and Suction available Patient Re-evaluated:Patient Re-evaluated prior to inductionOxygen Delivery Method: Circle System Utilized Preoxygenation: Pre-oxygenation with 100% oxygen Intubation Type: IV induction Ventilation: Mask ventilation without difficulty Laryngoscope Size: Miller and 3 Grade View: Grade I Tube type: Oral Tube size: 7.0 mm Number of attempts: 1 Airway Equipment and Method: Stylet Placement Confirmation: ETT inserted through vocal cords under direct vision,  positive ETCO2 and breath sounds checked- equal and bilateral Secured at: 21 cm Tube secured with: Tape Dental Injury: Teeth and Oropharynx as per pre-operative assessment

## 2016-06-29 NOTE — H&P (Signed)
Preoperative History and Physical  [name] is a 35 y.o. No obstetric history on file. with Patient's last menstrual period was 06/12/2016. admitted for a TAHBSO.  Pt has been having worsening periods with heavy cramps and diarrhea with her period, she is soiling clothes sheets can't wear tampons and is doubling up pads It is interfering with her job and life in general Additionally she has bump dyspareunia and bilateral adnexal pain  Pt continues to have really daily pain now, 60% of the time, megace caused her period to be lighter at first but her pain is worse, she has not had intercourse Her pain is midline and lateral, meaning her adnexa/ovaries are involved She understands since she has not responded to megestrol and she has chronic pelvic pain, endometrial ablation is not a viable option  Understands TAHBSO is the only option that will address all the issues(2 previous C sections no descent, need to remove cerivx  Bump dyspareunia) She understands she will need post op ERT  PMH:    Past Medical History:  Diagnosis Date  . Anemia   . Anxiety   . Constipation   . Diabetes mellitus without complication (HCC)   . Family history of malignant neoplasm of gastrointestinal tract   . Gastroparesis   . IBS (irritable bowel syndrome)    constipation  . Neuropathy due to type 2 diabetes mellitus (HCC)     PSH:     Past Surgical History:  Procedure Laterality Date  . CESAREAN SECTION    . CHOLECYSTECTOMY  2007  . EYE SURGERY  1989  . TONSILLECTOMY  1992    POb/GynH:      OB History    No data available      SH:   Social History  Substance Use Topics  . Smoking status: Former Smoker    Types: Cigarettes    Quit date: 06/24/2014  . Smokeless tobacco: Never Used  . Alcohol use No    FH:    Family History  Problem Relation Age of Onset  . Colon cancer Paternal Grandfather      Allergies:  Allergies  Allergen Reactions  . Celebrex [Celecoxib]   . Latex    . Sulfonamide Derivatives     Medications:       Current Facility-Administered Medications:  .  ceFAZolin (ANCEF) IVPB 2g/100 mL premix, 2 g, Intravenous, On Call to OR, Lazaro ArmsLuther H Josean Lycan, MD .  ketorolac (TORADOL) 30 MG/ML injection 30 mg, 30 mg, Intravenous, Once, Lazaro ArmsLuther H Wally Shevchenko, MD  Review of Systems:   Review of Systems  Constitutional: Negative for fever, chills, weight loss, malaise/fatigue and diaphoresis.  HENT: Negative for hearing loss, ear pain, nosebleeds, congestion, sore throat, neck pain, tinnitus and ear discharge.   Eyes: Negative for blurred vision, double vision, photophobia, pain, discharge and redness.  Respiratory: Negative for cough, hemoptysis, sputum production, shortness of breath, wheezing and stridor.   Cardiovascular: Negative for chest pain, palpitations, orthopnea, claudication, leg swelling and PND.  Gastrointestinal: Positive for abdominal pain. Negative for heartburn, nausea, vomiting, diarrhea, constipation, blood in stool and melena.  Genitourinary: Negative for dysuria, urgency, frequency, hematuria and flank pain.  Musculoskeletal: Negative for myalgias, back pain, joint pain and falls.  Skin: Negative for itching and rash.  Neurological: Negative for dizziness, tingling, tremors, sensory change, speech change, focal weakness, seizures, loss of consciousness, weakness and headaches.  Endo/Heme/Allergies: Negative for environmental allergies and polydipsia. Does not bruise/bleed easily.  Psychiatric/Behavioral: Negative for depression, suicidal ideas, hallucinations,  memory loss and substance abuse. The patient is not nervous/anxious and does not have insomnia.      PHYSICAL EXAM:  Blood pressure 125/82, temperature 98 F (36.7 C), temperature source Oral, resp. rate 20, last menstrual period 06/12/2016, SpO2 97 %.    Vitals reviewed. Constitutional: She is oriented to person, place, and time. She appears well-developed and well-nourished.  HENT:   Head: Normocephalic and atraumatic.  Right Ear: External ear normal.  Left Ear: External ear normal.  Nose: Nose normal.  Mouth/Throat: Oropharynx is clear and moist.  Eyes: Conjunctivae and EOM are normal. Pupils are equal, round, and reactive to light. Right eye exhibits no discharge. Left eye exhibits no discharge. No scleral icterus.  Neck: Normal range of motion. Neck supple. No tracheal deviation present. No thyromegaly present.  Cardiovascular: Normal rate, regular rhythm, normal heart sounds and intact distal pulses.  Exam reveals no gallop and no friction rub.   No murmur heard. Respiratory: Effort normal and breath sounds normal. No respiratory distress. She has no wheezes. She has no rales. She exhibits no tenderness.  GI: Soft. Bowel sounds are normal. She exhibits no distension and no mass. There is tenderness. There is no rebound and no guarding.  Genitourinary:       Vulva is normal without lesions Vagina is pink moist without discharge Cervix normal in appearance and pap is normal Uterus is normal size shape contour, tender to palpation Adnexa is negative with normal sized ovaries by sonogram tender to palpation Musculoskeletal: Normal range of motion. She exhibits no edema and no tenderness.  Neurological: She is alert and oriented to person, place, and time. She has normal reflexes. She displays normal reflexes. No cranial nerve deficit. She exhibits normal muscle tone. Coordination normal.  Skin: Skin is warm and dry. No rash noted. No erythema. No pallor.  Psychiatric: She has a normal mood and affect. Her behavior is normal. Judgment and thought content normal.    Labs: Results for orders placed or performed during the hospital encounter of 06/24/16 (from the past 336 hour(s))  Surgical pcr screen   Collection Time: 06/24/16  8:53 AM  Result Value Ref Range   MRSA, PCR NEGATIVE NEGATIVE   Staphylococcus aureus POSITIVE (A) NEGATIVE  CBC   Collection Time:  06/24/16  8:53 AM  Result Value Ref Range   WBC 7.2 4.0 - 10.5 K/uL   RBC 5.04 3.87 - 5.11 MIL/uL   Hemoglobin 14.7 12.0 - 15.0 g/dL   HCT 16.1 09.6 - 04.5 %   MCV 88.5 78.0 - 100.0 fL   MCH 29.2 26.0 - 34.0 pg   MCHC 33.0 30.0 - 36.0 g/dL   RDW 40.9 81.1 - 91.4 %   Platelets 310 150 - 400 K/uL  Comprehensive metabolic panel   Collection Time: 06/24/16  8:53 AM  Result Value Ref Range   Sodium 137 135 - 145 mmol/L   Potassium 4.3 3.5 - 5.1 mmol/L   Chloride 102 101 - 111 mmol/L   CO2 24 22 - 32 mmol/L   Glucose, Bld 209 (H) 65 - 99 mg/dL   BUN 12 6 - 20 mg/dL   Creatinine, Ser 7.82 0.44 - 1.00 mg/dL   Calcium 9.8 8.9 - 95.6 mg/dL   Total Protein 7.6 6.5 - 8.1 g/dL   Albumin 4.4 3.5 - 5.0 g/dL   AST 213 (H) 15 - 41 U/L   ALT 243 (H) 14 - 54 U/L   Alkaline Phosphatase 141 (H) 38 - 126  U/L   Total Bilirubin 0.5 0.3 - 1.2 mg/dL   GFR calc non Af Amer >60 >60 mL/min   GFR calc Af Amer >60 >60 mL/min   Anion gap 11 5 - 15  hCG, quantitative, pregnancy   Collection Time: 06/24/16  8:53 AM  Result Value Ref Range   hCG, Beta Chain, Quant, S <1 <5 mIU/mL  Urinalysis, Routine w reflex microscopic (not at [facility])   Collection Time: 06/24/16  8:53 AM  Result Value Ref Range   Color, Urine YELLOW YELLOW   APPearance CLEAR CLEAR   Specific Gravity, Urine 1.015 1.005 - 1.030   pH 6.5 5.0 - 8.0   Glucose, UA 500 (A) NEGATIVE mg/dL   Hgb urine dipstick NEGATIVE NEGATIVE   Bilirubin Urine NEGATIVE NEGATIVE   Ketones, ur TRACE (A) NEGATIVE mg/dL   Protein, ur NEGATIVE NEGATIVE mg/dL   Nitrite NEGATIVE NEGATIVE   Leukocytes, UA NEGATIVE NEGATIVE  Type and screen [facility]   Collection Time: 06/24/16  8:53 AM  Result Value Ref Range   ABO/RH(D) O POS    Antibody Screen NEG    Sample Expiration 07/08/2016    Extend sample reason NO TRANSFUSIONS OR PREGNANCY IN THE PAST 3 MONTHS     EKG: No orders found for this or any previous visit.  Imaging Studies: US Transvaginal  Non-ob  Result Date: 06/15/2016 GYNECOLOGIC SONOGRAM KAYLON LAROCHE is a 35 y.o. LMP 06/12/2016 for a pelvic sonogram for menorrhagia,dysmenorrhea,and dyspareunia. Uterus                      7.3 x 3.4 x 4.1 cm, homogeneous uterus wnl Endometrium          5.9 mm, symmetrical, wnl Right ovary             2.7 x 2.6 x 2.6 cm, wnl Left ovary                3.0 x 1.8 x 1.7 cm, wnl Technician Comments: PELVIC US TA/TV: Homogeneous uterus wnl,normal ovaries bilat,EEC 5.9 mm,no free fluid,ov's appear to be mobile,bilat adnexal discomfort during ultrasound [employer] 06/15/2016 2:12 PM Clinical Impression and recommendations: I have reviewed the sonogram results above, combined with the patient's current clinical course, below are my impressions and any appropriate recommendations for management based on the sonographic findings. Normal uterus endometrium and ovaries No anatomical etiology is noted for her symptoms Lazaro Arms 06/15/2016 2:34 PM   US Pelvis Complete  Result Date: 06/15/2016 GYNECOLOGIC SONOGRAM BAYLE CALVO is a 35 y.o. LMP 06/12/2016 for a pelvic sonogram for menorrhagia,dysmenorrhea,and dyspareunia. Uterus                      7.3 x 3.4 x 4.1 cm, homogeneous uterus wnl Endometrium          5.9 mm, symmetrical, wnl Right ovary             2.7 x 2.6 x 2.6 cm, wnl Left ovary                3.0 x 1.8 x 1.7 cm, wnl Technician Comments: PELVIC US TA/TV: Homogeneous uterus wnl,normal ovaries bilat,EEC 5.9 mm,no free fluid,ov's appear to be mobile,bilat adnexal discomfort during ultrasound [employer] 06/15/2016 2:12 PM Clinical Impression and recommendations: I have reviewed the sonogram results above, combined with the patient's current clinical course, below are my impressions and any appropriate recommendations for management based on the  sonographic findings. Normal uterus endometrium and ovaries No anatomical etiology is noted for her symptoms Codi Folkerts H 06/15/2016 2:34 PM       Assessment: Menorrhagia with regular cycle Dysmenorrhea Dyspareunia, female: bump Bilateral adnexal pain   Patient Active Problem List   Diagnosis Date Noted  . Diabetes (HCC) 06/19/2013  . GERD 05/07/2010  . ABDOMINAL PAIN, LEFT LOWER QUADRANT 04/09/2010  . DEPRESSION 03/08/2010  . CONSTIPATION 03/08/2010  . HEMORRHAGE OF RECTUM AND ANUS 03/08/2010    Plan: TAHBSO  Pt understands the risks of surgery including but not limited t  excessive bleeding requiring transfusion or reoperation, post-operative infection requiring prolonged hospitalization or re-hospitalization and antibiotic therapy, and damage to other organs including bladder, bowel, ureters and major vessels.  The patient also understands the alternative treatment options which were discussed in full.  All questions were answered.  Arham Symmonds H 06/29/2016 7:33 AM   Rayyan Orsborn H 06/29/2016 7:29 AM

## 2016-06-30 LAB — BASIC METABOLIC PANEL
Anion gap: 6 (ref 5–15)
BUN: 10 mg/dL (ref 6–20)
CHLORIDE: 100 mmol/L — AB (ref 101–111)
CO2: 27 mmol/L (ref 22–32)
CREATININE: 0.58 mg/dL (ref 0.44–1.00)
Calcium: 8.3 mg/dL — ABNORMAL LOW (ref 8.9–10.3)
GFR calc Af Amer: >60 mL/min (ref >60)
GFR calc non Af Amer: >60 mL/min (ref >60)
GLUCOSE: 274 mg/dL — AB (ref 65–99)
POTASSIUM: 3.8 mmol/L (ref 3.5–5.1)
Sodium: 133 mmol/L — ABNORMAL LOW (ref 135–145)

## 2016-06-30 LAB — CBC
HEMATOCRIT: 27.7 % — AB (ref 36.0–46.0)
HEMOGLOBIN: 9.1 g/dL — AB (ref 12.0–15.0)
MCH: 28.7 pg (ref 26.0–34.0)
MCHC: 32.9 g/dL (ref 30.0–36.0)
MCV: 87.4 fL (ref 78.0–100.0)
Platelets: 305 10*3/uL (ref 150–400)
RBC: 3.17 MIL/uL — AB (ref 3.87–5.11)
RDW: 13.8 % (ref 11.5–15.5)
WBC: 11.1 10*3/uL — ABNORMAL HIGH (ref 4.0–10.5)

## 2016-06-30 LAB — GLUCOSE, CAPILLARY
Glucose-Capillary: 281 mg/dL — ABNORMAL HIGH (ref 65–99)
Glucose-Capillary: 285 mg/dL — ABNORMAL HIGH (ref 65–99)

## 2016-06-30 MED ORDER — ONDANSETRON HCL 8 MG PO TABS: 8.0000 mg | ORAL_TABLET | Freq: Four times a day (QID) | ORAL | 0 refills | Status: DC | PRN

## 2016-06-30 MED ORDER — OXYCODONE-ACETAMINOPHEN 5-325 MG PO TABS: 1.0000 | ORAL_TABLET | ORAL | 0 refills | Status: DC | PRN

## 2016-06-30 NOTE — Progress Notes (Signed)
Inpatient Diabetes Program Recommendations  AACE/ADA: New Consensus Statement on Inpatient Glycemic Control (2015)  Target Ranges:  Prepandial:   less than 140 mg/dL      Peak postprandial:   less than 180 mg/dL (1-2 hours)      Critically ill patients:  140 - 180 mg/dL  Results for [name], [name] (MRN [id number]) as of 06/30/2016 08:43  Ref. Range 06/30/2016 05:48  Glucose Latest Ref Range: 65 - 99 mg/dL 409274 (H)   Results for [name], [name] (MRN [id number]) as of 06/30/2016 08:43  Ref. Range 06/29/2016 07:29 06/29/2016 11:08 06/29/2016 16:27 06/29/2016 21:37  Glucose-Capillary Latest Ref Range: 65 - 99 mg/dL 956141 (H) 213213 (H) 086244 (H) 236 (H)   Review of Glycemic Control  Outpatient Diabetes medications: Lantus 30 units QHS, Novolog 10 units TID with meals plus additional units for correction (based on glucose) Current orders for Inpatient glycemic control: Lantus 30 units QHS, Novolog 10 units TID with meals  Inpatient Diabetes Program Recommendations: Correction (SSI): Please consider using Glycemic Control order set to order CBGs with Novolog moderate scale (0-15 units) ACHS. HgbA1C: Last A1C in the chart was 9.2% on 08/01/15. Please consider adding on an A1C to blood in lab to evaluate glycemic control over the past 2-3 months. Outpatient Follow Up: Noted in reviewing chart that patient saw Dr. Fransico HimNida on 10/05/15 and no notes of any follow up since then. Please encourage patient to follow up with Endocrinologist to improve diabetes control.  Thanks, Orlando PennerMarie Isabela Nardelli, RN, MSN, CDE Diabetes Coordinator Inpatient Diabetes Program [phone] (Team Pager from 8am to 5pm) [phone] ([facility] office) [phone] [facility] office) [phone] [facility] office)

## 2016-06-30 NOTE — Discharge Instructions (Signed)
Abdominal Hysterectomy, Care After °Refer to this sheet in the next few weeks. These instructions provide you with information on caring for yourself after your procedure. Your health care provider may also give you more specific instructions. Your treatment has been planned according to current medical practices, but problems sometimes occur. Call your health care provider if you have any problems or questions after your procedure.  °WHAT TO EXPECT AFTER THE PROCEDURE °After your procedure, it is typical to have the following: °· Pain. °· Feeling tired. °· Poor appetite. °· Less interest in sex. °It takes 4-6 weeks to recover from this surgery.  °HOME CARE INSTRUCTIONS  °· Take pain medicines only as directed by your health care provider. Do not take over-the-counter pain medicines without checking with your health care provider first.  °· Change your bandage as directed by your health care provider. °· Return to your health care provider to have your sutures taken out. °· Take showers instead of baths for 2-3 weeks. Ask your health care provider when it is safe to start showering.  °· Do not douche, use tampons, or have sexual intercourse for at least 6 weeks or until your health care provider says you can.   °· Follow your health care provider's advice about exercise, lifting, driving, and general activities. °· Get plenty of rest and sleep.   °· Do not lift anything heavier than a gallon of milk (about 10 lb [4.5 kg]) for the first month after surgery. °· You can resume your normal diet if your health care provider says it is okay.   °· Do not drink alcohol until your health care provider says you can.   °· If you are constipated, ask your health care provider if you can take a mild laxative. °· Eating foods high in fiber may also help with constipation. Eat plenty of raw fruits and vegetables, whole grains, and beans. °· Drink enough fluids to keep your urine clear or pale yellow.   °· Try to have someone at  home with you for the first 1-2 weeks to help around the house. °· Keep all follow-up appointments. °SEEK MEDICAL CARE IF:  °· You have chills or fever. °· You have swelling, redness, or pain in the area of your incision that is getting worse.   °· You have pus coming from the incision.   °· You notice a bad smell coming from the incision or bandage.   °· Your incision breaks open.   °· You feel dizzy or light-headed.   °· You have pain or bleeding when you urinate.   °· You have persistent diarrhea.   °· You have persistent nausea and vomiting.   °· You have abnormal vaginal discharge.   °· You have a rash.   °· You have any type of abnormal reaction or develop an allergy to your medicine.   °· Your pain medicine is not helping.   °SEEK IMMEDIATE MEDICAL CARE IF:  °· You have a fever and your symptoms suddenly get worse. °· You have severe abdominal pain. °· You have chest pain. °· You have shortness of breath. °· You faint. °· You have pain, swelling, or redness of your leg. °· You have heavy vaginal bleeding with blood clots. °MAKE SURE YOU: °· Understand these instructions. °· Will watch your condition. °· Will get help right away if you are not doing well or get worse. °  °This information is not intended to replace advice given to you by your health care provider. Make sure you discuss any questions you have with your health care provider. °  °Document   Released: 03/25/2005 Document Revised: 09/26/2014 Document Reviewed: 06/28/2013 °Elsevier Interactive Patient Education ©2016 [employer] Inc. ° °

## 2016-06-30 NOTE — Care Management Note (Signed)
Case Management Note  Patient Details  Name: Domingo MendVictoria N Laflamme MRN: [id number] Date of Birth: [birth year]  Subjective/Objective:                  Pt S/P TAH-BSO. Chart reviewed for CM needs. Pt is from home, lives with family and is ind with ADL's. She has insurance with drug coverage, transportation and PCP. Pt plans to return home with self care.   Action/Plan: No CM needs anticipated.   Expected Discharge Date:    07/01/2016              Expected Discharge Plan:  Home/Self Care  In-House Referral:  NA  Discharge planning Services  CM Consult  Post Acute Care Choice:  NA Choice offered to:  NA  DME Arranged:    DME Agency:     HH Arranged:    HH Agency:     Status of Service:  Completed, signed off  Malcolm MetroChildress, Jakerria Kingbird Demske, RN 06/30/2016, 9:39 AM

## 2016-06-30 NOTE — Discharge Summary (Signed)
Physician Discharge Summary  Patient ID: [name] MRN: [id number] DOB/AGE: [birth year] 35 y.o.  Admit date: 06/29/2016 Discharge date: 06/30/2016  Admission Diagnoses: Menometrorrhagia, dysmenorrhea, dyspareunia, lateral pelvic pain  Discharge Diagnoses:  Active Problems:   S/P TAH-BSO   Discharged Condition: stable  Hospital Course: unremarkable post operative ourse  Consults: None  Significant Diagnostic Studies: labs:   Treatments: surgery: TAHBSO  Discharge Exam: Blood pressure 103/63, pulse 100, temperature 98.4 F (36.9 C), temperature source Oral, resp. rate 16, height 5\' 2"  (1.575 m), weight 144 lb (65.3 kg), last menstrual period 06/12/2016, SpO2 97 %. General appearance: alert, cooperative and no distress GI: soft, non-tender; bowel sounds normal; no masses,  no organomegaly Incision/Wound:clean dry intact  Disposition: 01-Home or Self Care  Discharge Instructions    Call MD for:  persistant nausea and vomiting    Complete by:  As directed    Call MD for:  severe uncontrolled pain    Complete by:  As directed    Call MD for:  temperature >100.4    Complete by:  As directed    Diet - low sodium heart healthy    Complete by:  As directed    Driving Restrictions    Complete by:  As directed    Do not drive for a week   Increase activity slowly    Complete by:  As directed    Leave dressing on - Keep it clean, dry, and intact until clinic visit    Complete by:  As directed    Lifting restrictions    Complete by:  As directed    Do not lift more than 10 pounds   Sexual Activity Restrictions    Complete by:  As directed    No sex for 6 weeks       Medication List    STOP taking these medications   HYDROcodone-acetaminophen 5-325 MG tablet Commonly known as:  NORCO/VICODIN   megestrol 40 MG tablet Commonly known as:  MEGACE     TAKE these medications   escitalopram 20 MG tablet Commonly known as:  LEXAPRO Take 20 mg by mouth daily.    gabapentin 300 MG capsule Commonly known as:  NEURONTIN TAKE ONE CAPSULE BY MOUTH THREE TIMES DAILY   insulin aspart 100 UNIT/ML injection Commonly known as:  novoLOG Inject 10-16 Units into the skin 3 (three) times daily with meals.   insulin glargine 100 UNIT/ML injection Commonly known as:  LANTUS Inject 0.3 mLs (30 Units total) into the skin at bedtime.   LORazepam 0.5 MG tablet Commonly known as:  ATIVAN TAKE ONE TABLET BY MOUTH AS NEEDED FOR ANXIETY   ondansetron 8 MG tablet Commonly known as:  ZOFRAN Take 1 tablet (8 mg total) by mouth every 6 (six) hours as needed for nausea.   oxyCODONE-acetaminophen 5-325 MG tablet Commonly known as:  PERCOCET/ROXICET Take 1-2 tablets by mouth every 4 (four) hours as needed (moderate to severe pain (when tolerating fluids)).   valACYclovir 1000 MG tablet Commonly known as:  VALTREX Take 1 tablet (1,000 mg total) by mouth daily.      Follow-up Information    Lazaro ArmsEURE,LUTHER H, MD Follow up in 1 week(s).   Specialties:  Obstetrics and Gynecology, Radiology Contact information: [address] [place] [address] [phone]           Signed: Lazaro ArmsURE,LUTHER H 06/30/2016, 10:31 AM

## 2016-06-30 NOTE — Progress Notes (Signed)
2201 foley catheter removed. 400cc straw yellow urine noted in the drainage bag. Patient assisted to the bathroom aprox 15mins later and voided less than 50cc. Will continue to monitor.

## 2016-06-30 NOTE — Anesthesia Postprocedure Evaluation (Signed)
Anesthesia Post Note  Patient: Domingo MendVictoria N Mascari  Procedure(s) Performed: Procedure(s) (LRB): TOTAL ABDOMINAL HYSTERECTOMY (N/A) BILATERAL SALPINGO OOPHORECTOMY (Bilateral)  Patient location during evaluation: Nursing Unit Anesthesia Type: General Level of consciousness: awake and alert, oriented and patient cooperative Pain management: pain level controlled Vital Signs Assessment: post-procedure vital signs reviewed and stable Respiratory status: spontaneous breathing, nonlabored ventilation and respiratory function stable Cardiovascular status: blood pressure returned to baseline and stable Postop Assessment: no signs of nausea or vomiting and adequate PO intake Anesthetic complications: no    Last Vitals:  Vitals:   06/30/16 0234 06/30/16 0634  BP: 132/75 103/63  Pulse: 100 100  Resp: 16 16  Temp: 36.9 C 36.9 C    Last Pain:  Vitals:   06/30/16 0930  TempSrc:   PainSc: 3                  Jorgia Manthei J

## 2016-06-30 NOTE — Addendum Note (Signed)
Addendum  created 06/30/16 1255 by Despina Hiddenobert J Corrigan Kretschmer, CRNA   Sign clinical note

## 2016-06-30 NOTE — Progress Notes (Signed)
Pt discharged home today per Dr. Eure. Pt's IV site D/C'd and WDL. Pt's VSS. Pt provided with home medication list, discharge instructions and prescriptions. Verbalized understanding. Pt left floor via WC in stable condition accompanied by NT. 

## 2016-07-01 ENCOUNTER — Encounter (HOSPITAL_COMMUNITY): Payer: Self-pay | Admitting: Obstetrics & Gynecology

## 2016-07-07 ENCOUNTER — Encounter: Payer: Self-pay | Admitting: Obstetrics & Gynecology

## 2016-07-07 ENCOUNTER — Ambulatory Visit (INDEPENDENT_AMBULATORY_CARE_PROVIDER_SITE_OTHER): Payer: Medicaid Other | Admitting: Obstetrics & Gynecology

## 2016-07-07 VITALS — BP 150/80 | HR 84 | Ht 62.0 in | Wt 146.0 lb

## 2016-07-07 DIAGNOSIS — Z9889 Other specified postprocedural states: Secondary | ICD-10-CM

## 2016-07-07 DIAGNOSIS — Z90710 Acquired absence of both cervix and uterus: Secondary | ICD-10-CM

## 2016-07-07 DIAGNOSIS — Z9079 Acquired absence of other genital organ(s): Principal | ICD-10-CM

## 2016-07-07 DIAGNOSIS — Z90722 Acquired absence of ovaries, bilateral: Secondary | ICD-10-CM

## 2016-07-07 MED ORDER — ESTRADIOL 0.1 MG/24HR TD PTTW: 1.0000 | MEDICATED_PATCH | TRANSDERMAL | 12 refills | Status: DC

## 2016-07-07 MED ORDER — OXYCODONE-ACETAMINOPHEN 5-325 MG PO TABS: 1.0000 | ORAL_TABLET | ORAL | 0 refills | Status: DC | PRN

## 2016-07-07 NOTE — Progress Notes (Signed)
  HPI: Patient returns for routine postoperative follow-up having undergone TAHBSO on 06/29/2016.  The patient's immediate postoperative recovery has been unremarkable. Since hospital discharge the patient reports diarrhea for the past 3 days.   Current Outpatient Prescriptions: escitalopram (LEXAPRO) 20 MG tablet, Take 20 mg by mouth daily., Disp: , Rfl:  gabapentin (NEURONTIN) 300 MG capsule, TAKE ONE CAPSULE BY MOUTH THREE TIMES DAILY, Disp: 90 capsule, Rfl: 3 insulin aspart (NOVOLOG) 100 UNIT/ML injection, Inject 10-16 Units into the skin 3 (three) times daily with meals., Disp: 10 mL, Rfl: 2 insulin glargine (LANTUS) 100 UNIT/ML injection, Inject 0.3 mLs (30 Units total) into the skin at bedtime., Disp: 10 mL, Rfl: 3 LORazepam (ATIVAN) 0.5 MG tablet, TAKE ONE TABLET BY MOUTH AS NEEDED FOR ANXIETY, Disp: 60 tablet, Rfl: 3 ondansetron (ZOFRAN) 8 MG tablet, Take 1 tablet (8 mg total) by mouth every 6 (six) hours as needed for nausea., Disp: 20 tablet, Rfl: 0 oxyCODONE-acetaminophen (PERCOCET/ROXICET) 5-325 MG tablet, Take 1-2 tablets by mouth every 4 (four) hours as needed (moderate to severe pain (when tolerating fluids))., Disp: 30 tablet, Rfl: 0 valACYclovir (VALTREX) 1000 MG tablet, Take 1 tablet (1,000 mg total) by mouth daily., Disp: 30 tablet, Rfl: 6 estradiol (VIVELLE-DOT) 0.1 MG/24HR patch, Place 1 patch (0.1 mg total) onto the skin 2 (two) times a week., Disp: 8 patch, Rfl: 12  No current facility-administered medications for this visit.     Blood pressure (!) 150/80, pulse 84, height 5\' 2"  (1.575 m), weight 146 lb (66.2 kg), last menstrual period 06/12/2016.  Physical Exam: Incision with significant bruising as I expected no erythema cellulitis or evidnce of hematoma or infection  Diagnostic Tests:   Pathology: benign  Impression: S/P TAHBSO  Plan: Refill oxycodone and vivelle dot patch  Follow up: monday  For staple eval, re check diarrhea  Lazaro ArmsEURE,Kashira Behunin H, MD

## 2016-07-11 ENCOUNTER — Encounter: Payer: Self-pay | Admitting: Obstetrics & Gynecology

## 2016-07-11 ENCOUNTER — Ambulatory Visit (INDEPENDENT_AMBULATORY_CARE_PROVIDER_SITE_OTHER): Payer: Medicaid Other | Admitting: Obstetrics & Gynecology

## 2016-07-11 VITALS — BP 140/70 | HR 68 | Ht 62.0 in | Wt 143.2 lb

## 2016-07-11 DIAGNOSIS — K521 Toxic gastroenteritis and colitis: Secondary | ICD-10-CM | POA: Diagnosis not present

## 2016-07-11 DIAGNOSIS — Z90710 Acquired absence of both cervix and uterus: Secondary | ICD-10-CM

## 2016-07-11 DIAGNOSIS — Z90722 Acquired absence of ovaries, bilateral: Principal | ICD-10-CM

## 2016-07-11 DIAGNOSIS — T3695XA Adverse effect of unspecified systemic antibiotic, initial encounter: Secondary | ICD-10-CM

## 2016-07-11 DIAGNOSIS — Z9079 Acquired absence of other genital organ(s): Principal | ICD-10-CM

## 2016-07-11 MED ORDER — METRONIDAZOLE 500 MG PO TABS: 500.0000 mg | ORAL_TABLET | Freq: Two times a day (BID) | ORAL | 0 refills | Status: DC

## 2016-07-11 NOTE — Progress Notes (Signed)
  HPI: Patient returns for routine postoperative follow-up having undergone TAHBSO on 06/29/2016.  The patient's immediate postoperative recovery has been unremarkable. Since hospital discharge the patient reports continued diarrhea.   Current Outpatient Prescriptions: escitalopram (LEXAPRO) 20 MG tablet, Take 20 mg by mouth daily., Disp: , Rfl:  estradiol (VIVELLE-DOT) 0.1 MG/24HR patch, Place 1 patch (0.1 mg total) onto the skin 2 (two) times a week., Disp: 8 patch, Rfl: 12 gabapentin (NEURONTIN) 300 MG capsule, TAKE ONE CAPSULE BY MOUTH THREE TIMES DAILY, Disp: 90 capsule, Rfl: 3 insulin aspart (NOVOLOG) 100 UNIT/ML injection, Inject 10-16 Units into the skin 3 (three) times daily with meals., Disp: 10 mL, Rfl: 2 insulin glargine (LANTUS) 100 UNIT/ML injection, Inject 0.3 mLs (30 Units total) into the skin at bedtime., Disp: 10 mL, Rfl: 3 LORazepam (ATIVAN) 0.5 MG tablet, TAKE ONE TABLET BY MOUTH AS NEEDED FOR ANXIETY, Disp: 60 tablet, Rfl: 3 ondansetron (ZOFRAN) 8 MG tablet, Take 1 tablet (8 mg total) by mouth every 6 (six) hours as needed for nausea., Disp: 20 tablet, Rfl: 0 oxyCODONE-acetaminophen (PERCOCET/ROXICET) 5-325 MG tablet, Take 1-2 tablets by mouth every 4 (four) hours as needed (moderate to severe pain (when tolerating fluids))., Disp: 30 tablet, Rfl: 0 valACYclovir (VALTREX) 1000 MG tablet, Take 1 tablet (1,000 mg total) by mouth daily., Disp: 30 tablet, Rfl: 6 metroNIDAZOLE (FLAGYL) 500 MG tablet, Take 1 tablet (500 mg total) by mouth 2 (two) times daily., Disp: 14 tablet, Rfl: 0  No current facility-administered medications for this visit.     Blood pressure 140/70, pulse 68, height 5\' 2"  (1.575 m), weight 143 lb 3.2 oz (65 kg), last menstrual period 06/12/2016.  Physical Exam: Incision clean dry intact some dried blood 1/2 stapes removed  Diagnostic Tests:   Pathology:   Impression: S/p TAHBSO Post op diarrheaAAD?  Plan: 1/2 staples removed today Begin   flagyl 500 BID x 7 days  Follow up: 3  days  Lazaro ArmsEURE,LUTHER H, MD

## 2016-07-14 ENCOUNTER — Encounter: Payer: Self-pay | Admitting: Obstetrics & Gynecology

## 2016-07-14 ENCOUNTER — Ambulatory Visit (INDEPENDENT_AMBULATORY_CARE_PROVIDER_SITE_OTHER): Payer: Medicaid Other | Admitting: Obstetrics & Gynecology

## 2016-07-14 VITALS — BP 110/70 | HR 72 | Wt 145.0 lb

## 2016-07-14 DIAGNOSIS — Z9079 Acquired absence of other genital organ(s): Secondary | ICD-10-CM

## 2016-07-14 DIAGNOSIS — Z90722 Acquired absence of ovaries, bilateral: Secondary | ICD-10-CM

## 2016-07-14 DIAGNOSIS — Z90710 Acquired absence of both cervix and uterus: Secondary | ICD-10-CM

## 2016-07-14 MED ORDER — OXYCODONE-ACETAMINOPHEN 5-325 MG PO TABS: 1.0000 | ORAL_TABLET | ORAL | 0 refills | Status: DC | PRN

## 2016-07-14 NOTE — Progress Notes (Signed)
  HPI: Patient returns for routine postoperative follow-up having undergone TAHBSO on 10/19.  The patient's immediate postoperative recovery has been unremarkable. Since hospital discharge the patient reports normal post op.   Current Outpatient Prescriptions: escitalopram (LEXAPRO) 20 MG tablet, Take 20 mg by mouth daily., Disp: , Rfl:  estradiol (VIVELLE-DOT) 0.1 MG/24HR patch, Place 1 patch (0.1 mg total) onto the skin 2 (two) times a week., Disp: 8 patch, Rfl: 12 gabapentin (NEURONTIN) 300 MG capsule, TAKE ONE CAPSULE BY MOUTH THREE TIMES DAILY, Disp: 90 capsule, Rfl: 3 insulin aspart (NOVOLOG) 100 UNIT/ML injection, Inject 10-16 Units into the skin 3 (three) times daily with meals., Disp: 10 mL, Rfl: 2 insulin glargine (LANTUS) 100 UNIT/ML injection, Inject 0.3 mLs (30 Units total) into the skin at bedtime., Disp: 10 mL, Rfl: 3 LORazepam (ATIVAN) 0.5 MG tablet, TAKE ONE TABLET BY MOUTH AS NEEDED FOR ANXIETY, Disp: 60 tablet, Rfl: 3 metroNIDAZOLE (FLAGYL) 500 MG tablet, Take 1 tablet (500 mg total) by mouth 2 (two) times daily., Disp: 14 tablet, Rfl: 0 ondansetron (ZOFRAN) 8 MG tablet, Take 1 tablet (8 mg total) by mouth every 6 (six) hours as needed for nausea., Disp: 20 tablet, Rfl: 0 oxyCODONE-acetaminophen (PERCOCET/ROXICET) 5-325 MG tablet, Take 1-2 tablets by mouth every 4 (four) hours as needed (moderate to severe pain (when tolerating fluids))., Disp: 30 tablet, Rfl: 0 valACYclovir (VALTREX) 1000 MG tablet, Take 1 tablet (1,000 mg total) by mouth daily., Disp: 30 tablet, Rfl: 6  No current facility-administered medications for this visit.     Blood pressure 110/70, pulse 72, weight 145 lb (65.8 kg), last menstrual period 06/12/2016.  Physical Exam: Incision with bruising no erythema or s/s infection  Diagnostic Tests:   Pathology: benign  Impression: Normal post op course  Plan: Will begin to take the staples out in steps next week  Follow up: 4   days  Lazaro ArmsEURE,LUTHER H, MD

## 2016-07-26 ENCOUNTER — Telehealth: Payer: Self-pay | Admitting: *Deleted

## 2016-07-26 NOTE — Telephone Encounter (Signed)
Pt states her incision site has a small opening "1/4 inch" slight clear drainage, no odor, does she need to be seen today or just keep her appt for Thursday? Pt informed per Dr. Despina HiddenEure keep neosporin on area and keep her appt for Thursday of this week. Pt verbalized understanding.

## 2016-07-28 ENCOUNTER — Encounter: Payer: Self-pay | Admitting: Obstetrics & Gynecology

## 2016-07-28 ENCOUNTER — Ambulatory Visit (INDEPENDENT_AMBULATORY_CARE_PROVIDER_SITE_OTHER): Payer: Medicaid Other | Admitting: Obstetrics & Gynecology

## 2016-07-28 VITALS — BP 140/80 | HR 62 | Wt 147.0 lb

## 2016-07-28 DIAGNOSIS — Z9889 Other specified postprocedural states: Secondary | ICD-10-CM

## 2016-07-28 DIAGNOSIS — Z90722 Acquired absence of ovaries, bilateral: Secondary | ICD-10-CM

## 2016-07-28 DIAGNOSIS — Z90710 Acquired absence of both cervix and uterus: Secondary | ICD-10-CM

## 2016-07-28 DIAGNOSIS — Z9079 Acquired absence of other genital organ(s): Secondary | ICD-10-CM

## 2016-07-28 MED ORDER — HYDROCODONE-ACETAMINOPHEN 5-325 MG PO TABS: 1.0000 | ORAL_TABLET | Freq: Four times a day (QID) | ORAL | 0 refills | Status: DC | PRN

## 2016-07-28 NOTE — Progress Notes (Signed)
  HPI: Patient returns for routine postoperative follow-up having undergone TAHBSO on 06/29/2016  The patient's immediate postoperative recovery has been unremarkable. Since hospital discharge the patient reports small seperation in the midline, no erythema or hematoma or s/s infection.   Current Outpatient Prescriptions: escitalopram (LEXAPRO) 20 MG tablet, Take 20 mg by mouth daily., Disp: , Rfl:  estradiol (VIVELLE-DOT) 0.1 MG/24HR patch, Place 1 patch (0.1 mg total) onto the skin 2 (two) times a week., Disp: 8 patch, Rfl: 12 gabapentin (NEURONTIN) 300 MG capsule, TAKE ONE CAPSULE BY MOUTH THREE TIMES DAILY, Disp: 90 capsule, Rfl: 3 insulin aspart (NOVOLOG) 100 UNIT/ML injection, Inject 10-16 Units into the skin 3 (three) times daily with meals., Disp: 10 mL, Rfl: 2 insulin glargine (LANTUS) 100 UNIT/ML injection, Inject 0.3 mLs (30 Units total) into the skin at bedtime., Disp: 10 mL, Rfl: 3 LORazepam (ATIVAN) 0.5 MG tablet, TAKE ONE TABLET BY MOUTH AS NEEDED FOR ANXIETY, Disp: 60 tablet, Rfl: 3 ondansetron (ZOFRAN) 8 MG tablet, Take 1 tablet (8 mg total) by mouth every 6 (six) hours as needed for nausea., Disp: 20 tablet, Rfl: 0 valACYclovir (VALTREX) 1000 MG tablet, Take 1 tablet (1,000 mg total) by mouth daily., Disp: 30 tablet, Rfl: 6 oxyCODONE-acetaminophen (PERCOCET/ROXICET) 5-325 MG tablet, Take 1-2 tablets by mouth every 4 (four) hours as needed (moderate to severe pain (when tolerating fluids)). (Patient not taking: Reported on 07/28/2016), Disp: 30 tablet, Rfl: 0  No current facility-administered medications for this visit.     Blood pressure 140/80, pulse 62, weight 147 lb (66.7 kg), last menstrual period 06/12/2016.  Physical Exam: Incision clean dry small seperation  Diagnostic Tests: none  Pathology:   Impression: Small superficial seperation Pt is 1 month post op and diabetic  Plan:   Follow up: 2  weeks  Lazaro ArmsEURE,LUTHER H, MD

## 2016-08-04 NOTE — Brief Op Note (Signed)
  06/29/2016  10:17 AM  PATIENT:  [name]  35 y.o. female  PRE-OPERATIVE DIAGNOSIS:  MENORRHAGIA  POST-OPERATIVE DIAGNOSIS:  MENORRHAGIA  PROCEDURE:  Procedure(s): TOTAL ABDOMINAL HYSTERECTOMY (N/A) BILATERAL SALPINGO OOPHORECTOMY (Bilateral)  SURGEON:  Surgeon(s) and Role:    * Lazaro ArmsLuther [name] Kayman Snuffer, [name] - Primary  PHYSICIAN ASSISTANT:   ASSISTANTS:    ANESTHESIA:   general  EBL:  250 cc  BLOOD ADMINISTERED:none  DRAINS: none   LOCAL MEDICATIONS USED:  OTHER exparel  SPECIMEN:  Uterus tubes and ovaries  DISPOSITION OF SPECIMEN:  PATHOLOGY  COUNTS:  YES  TOURNIQUET:  none  DICTATION: .Dragon Dictation  PLAN OF CARE: Admit for overnight observation  PATIENT DISPOSITION:  PACU - hemodynamically stable.   Delay start of Pharmacological VTE agent (>24hrs) due to surgical blood loss or risk of bleeding: not applicable  Patient was taken the operating room placed in supine position where she underwent general tracheal anesthesia.  She was prepped and draped in usual sterile fashion A Pfannenstiel skin incision was made carried down sharply to rectus fascia which was scored in midline extended laterally The fascia was accounted for muscles superiorly and inferiorly and the muscles were divided in the peritoneal cavity was entered An Alexis self-retaining retractor was placed Uterine cornu were grasped with Coker clamps The round ligaments are suture ligated and cut The bladder flap was created on the left The infundibulopelvic ligament on the left was isolated and avascular window made crossclamped and double suture ligated The same procedure was carried out on the right thus both ovaries and tubes were removed The bladder blade and the right was created The uterine vessels were skeletonized Both sides were clamped cut and suture ligated the level of the internal os Serial pedicles were taken down the cervix to the cardinal ligament [name] pedicle being clamped cut and  suture ligated The vagina was crossclamped and the specimen was removed The vagina was closed with interrupted figure-of-eight sutures There was good resulting hemostasis and anatomical closure The pelvic peritoneum was closed Pelvis was irrigated vigorously and all pedicles were found to be hemostatic The Alexis and packs were removed The muscles and peritoneum reapproximated loosely The fascia was closed using 0 Vicryl running Subcutaneous tissue was undermined to allow the subparticular closure X per L was injected The skin was closed using 4-0 Vicryl on a Keith needle in subcuticular fashion Liquid band was placed The patient was taken to PACU in good stable condition all counts correct 3  Estimated blood loss 250 cc  Lazaro ArmsEURE,Tami Panning [name], [name]

## 2016-08-10 ENCOUNTER — Ambulatory Visit (INDEPENDENT_AMBULATORY_CARE_PROVIDER_SITE_OTHER): Payer: Medicaid Other | Admitting: Obstetrics & Gynecology

## 2016-08-10 ENCOUNTER — Encounter: Payer: Self-pay | Admitting: Obstetrics & Gynecology

## 2016-08-10 VITALS — BP 130/80 | HR 74 | Wt 140.0 lb

## 2016-08-10 DIAGNOSIS — Z90722 Acquired absence of ovaries, bilateral: Secondary | ICD-10-CM

## 2016-08-10 DIAGNOSIS — Z90710 Acquired absence of both cervix and uterus: Secondary | ICD-10-CM

## 2016-08-10 DIAGNOSIS — Z9889 Other specified postprocedural states: Secondary | ICD-10-CM

## 2016-08-10 DIAGNOSIS — Z9079 Acquired absence of other genital organ(s): Secondary | ICD-10-CM

## 2016-08-10 MED ORDER — GABAPENTIN 600 MG PO TABS: 600.0000 mg | ORAL_TABLET | Freq: Three times a day (TID) | ORAL | 11 refills | Status: DC

## 2016-08-10 NOTE — Progress Notes (Signed)
  HPI: Patient returns for routine postoperative follow-up having undergone YAHBSO on 06/29/2016.  The patient's immediate postoperative recovery has been unremarkable. Since hospital discharge the patient reports doing much better.   Current Outpatient Prescriptions: escitalopram (LEXAPRO) 20 MG tablet, Take 20 mg by mouth daily., Disp: , Rfl:  estradiol (VIVELLE-DOT) 0.1 MG/24HR patch, Place 1 patch (0.1 mg total) onto the skin 2 (two) times a week., Disp: 8 patch, Rfl: 12 HYDROcodone-acetaminophen (NORCO/VICODIN) 5-325 MG tablet, Take 1 tablet by mouth every 6 (six) hours as needed., Disp: 30 tablet, Rfl: 0 insulin aspart (NOVOLOG) 100 UNIT/ML injection, Inject 10-16 Units into the skin 3 (three) times daily with meals., Disp: 10 mL, Rfl: 2 insulin glargine (LANTUS) 100 UNIT/ML injection, Inject 0.3 mLs (30 Units total) into the skin at bedtime., Disp: 10 mL, Rfl: 3 LORazepam (ATIVAN) 0.5 MG tablet, TAKE ONE TABLET BY MOUTH AS NEEDED FOR ANXIETY, Disp: 60 tablet, Rfl: 3 ondansetron (ZOFRAN) 8 MG tablet, Take 1 tablet (8 mg total) by mouth every 6 (six) hours as needed for nausea., Disp: 20 tablet, Rfl: 0 valACYclovir (VALTREX) 1000 MG tablet, Take 1 tablet (1,000 mg total) by mouth daily., Disp: 30 tablet, Rfl: 6 gabapentin (NEURONTIN) 600 MG tablet, Take 1 tablet (600 mg total) by mouth 3 (three) times daily., Disp: 90 tablet, Rfl: 11  No current facility-administered medications for this visit.     Blood pressure 130/80, pulse 74, weight 140 lb (63.5 kg), last menstrual period 06/12/2016.  Physical Exam: Incision clean dry intact Cuff healing but a little sluggish  Diagnostic Tests:   Pathology:   Impression: S/p TAHBSO Doing well on vivelle dot 0.1 mg  Plan:   Follow up: 3  months  Lazaro ArmsEURE,Tameria Patti H, MD

## 2016-09-06 ENCOUNTER — Ambulatory Visit (INDEPENDENT_AMBULATORY_CARE_PROVIDER_SITE_OTHER): Payer: Medicaid Other | Admitting: Obstetrics & Gynecology

## 2016-09-06 ENCOUNTER — Encounter: Payer: Self-pay | Admitting: Obstetrics & Gynecology

## 2016-09-06 VITALS — BP 140/88 | HR 80 | Wt 141.0 lb

## 2016-09-06 DIAGNOSIS — R1031 Right lower quadrant pain: Secondary | ICD-10-CM

## 2016-09-06 DIAGNOSIS — E0842 Diabetes mellitus due to underlying condition with diabetic polyneuropathy: Secondary | ICD-10-CM

## 2016-09-06 DIAGNOSIS — J40 Bronchitis, not specified as acute or chronic: Secondary | ICD-10-CM | POA: Diagnosis not present

## 2016-09-06 MED ORDER — AZITHROMYCIN 250 MG PO TABS: ORAL_TABLET | ORAL | 0 refills | Status: DC

## 2016-09-06 NOTE — Progress Notes (Signed)
Chief Complaint  Patient presents with  . right sided lower abd pain    Blood pressure 140/88, pulse 80, weight 141 lb (64 kg), last menstrual period 06/12/2016.  35 y.o. No obstetric history on file. Patient's last menstrual period was 06/12/2016. The current method of family planning is status post hysterectomy.  Outpatient Encounter Prescriptions as of 09/06/2016  Medication Sig  . escitalopram (LEXAPRO) 20 MG tablet Take 20 mg by mouth daily.  Marland Kitchen. estradiol (VIVELLE-DOT) 0.1 MG/24HR patch Place 1 patch (0.1 mg total) onto the skin 2 (two) times a week.  . gabapentin (NEURONTIN) 600 MG tablet Take 1 tablet (600 mg total) by mouth 3 (three) times daily.  . insulin aspart (NOVOLOG) 100 UNIT/ML injection Inject 10-16 Units into the skin 3 (three) times daily with meals.  . insulin glargine (LANTUS) 100 UNIT/ML injection Inject 0.3 mLs (30 Units total) into the skin at bedtime.  Marland Kitchen. LORazepam (ATIVAN) 0.5 MG tablet TAKE ONE TABLET BY MOUTH AS NEEDED FOR ANXIETY  . valACYclovir (VALTREX) 1000 MG tablet Take 1 tablet (1,000 mg total) by mouth daily.  Marland Kitchen. azithromycin (ZITHROMAX Z-PAK) 250 MG tablet Take 2 tablets today and then 1 a day til finished  . HYDROcodone-acetaminophen (NORCO/VICODIN) 5-325 MG tablet Take 1 tablet by mouth every 6 (six) hours as needed. (Patient not taking: Reported on 09/06/2016)  . ondansetron (ZOFRAN) 8 MG tablet Take 1 tablet (8 mg total) by mouth every 6 (six) hours as needed for nausea. (Patient not taking: Reported on 09/06/2016)   No facility-administered encounter medications on file as of 09/06/2016.     Subjective Pt started with some RLQ pain 10 days ago, happened about 2 days after cleaning a house No bleeding. No sex  Feels worse with movements  Also has ahd a productive cough for 1 week, no fever chills congestion, scratchy   Objective Lungs clear no wheezes or consolidation Abdomen soft normal post op + Cornett's sign  Incision clean dry  intact Cuff intact no abdominal masses  Pertinent ROS No burning with urination, frequency or urgency No nausea, vomiting or diarrhea Nor fever chills or other constitutional symptoms   Labs or studies     Impression Diagnoses this Encounter::   ICD-9-CM ICD-10-CM   1. Bronchitis 490 J40   2. Abdominal wall pain in right lower quadrant 789.03 R10.31    musculoskeletal, benign  3. 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 azithromycin (ZITHROMAX Z-PAK) 250 MG tablet    Sig: Take 2 tablets today and then 1 a day til finished    Dispense:  6 each    Refill:  0    Labs or Scans Ordered: No orders of the defined types were placed in this encounter.   Management:: Azithromycin for bronchitis in a diabetic patient Benign abdominal wall pain CBG are improving  Follow up Return if symptoms worsen or fail to improve.    All questions were answered.  Past Medical History:  Diagnosis Date  . Anemia   . Anxiety   . Constipation   . Diabetes mellitus without complication (HCC)   . Family history of malignant neoplasm of gastrointestinal tract   . Gastroparesis   . IBS (irritable bowel syndrome)    constipation  . Neuropathy due to type 2 diabetes mellitus (HCC)  Past Surgical History:  Procedure Laterality Date  . ABDOMINAL HYSTERECTOMY N/A 06/29/2016   Procedure: TOTAL ABDOMINAL HYSTERECTOMY;  Surgeon: Lazaro ArmsLuther H Eure, MD;  Location: [facility];  Service: Gynecology;  Laterality: N/A;  . CESAREAN SECTION    . CHOLECYSTECTOMY  2007  . EYE SURGERY  1989  . SALPINGOOPHORECTOMY Bilateral 06/29/2016   Procedure: BILATERAL SALPINGO OOPHORECTOMY;  Surgeon: Lazaro ArmsLuther H Eure, MD;  Location: [facility];  Service: Gynecology;  Laterality: Bilateral;  . TONSILLECTOMY  1992    OB History    No  data available      Allergies  Allergen Reactions  . Celebrex [Celecoxib]   . Latex   . Sulfonamide Derivatives     Social History   Social History  . Marital status: Single    Spouse name: N/A  . Number of children: N/A  . Years of education: N/A   Social History Main Topics  . Smoking status: Former Smoker    Types: Cigarettes    Quit date: 06/24/2014  . Smokeless tobacco: Never Used  . Alcohol use No  . Drug use: No  . Sexual activity: Not Currently    Birth control/ protection: Surgical   Other Topics Concern  . None   Social History Narrative  . None    Family History  Problem Relation Age of Onset  . Colon cancer Paternal Grandfather

## 2016-10-01 ENCOUNTER — Other Ambulatory Visit: Payer: Self-pay | Admitting: Obstetrics & Gynecology

## 2016-10-03 ENCOUNTER — Other Ambulatory Visit: Payer: Self-pay | Admitting: Obstetrics & Gynecology

## 2016-10-10 ENCOUNTER — Telehealth: Payer: Self-pay | Admitting: *Deleted

## 2016-10-10 ENCOUNTER — Telehealth: Payer: Self-pay | Admitting: Obstetrics & Gynecology

## 2016-10-10 NOTE — Telephone Encounter (Signed)
Informed patient prescription was sent to pharmacy. 

## 2016-11-06 ENCOUNTER — Other Ambulatory Visit: Payer: Self-pay | Admitting: Obstetrics & Gynecology

## 2016-11-10 ENCOUNTER — Ambulatory Visit: Payer: Medicaid Other | Admitting: Obstetrics & Gynecology

## 2016-11-22 ENCOUNTER — Ambulatory Visit (INDEPENDENT_AMBULATORY_CARE_PROVIDER_SITE_OTHER): Payer: Medicaid Other | Admitting: Obstetrics & Gynecology

## 2016-11-22 ENCOUNTER — Encounter: Payer: Self-pay | Admitting: Obstetrics & Gynecology

## 2016-11-22 VITALS — BP 140/72 | HR 66 | Ht 62.0 in | Wt 145.0 lb

## 2016-11-22 DIAGNOSIS — G44209 Tension-type headache, unspecified, not intractable: Secondary | ICD-10-CM | POA: Diagnosis not present

## 2016-11-22 DIAGNOSIS — B373 Candidiasis of vulva and vagina: Secondary | ICD-10-CM

## 2016-11-22 DIAGNOSIS — N951 Menopausal and female climacteric states: Secondary | ICD-10-CM | POA: Diagnosis not present

## 2016-11-22 DIAGNOSIS — B3731 Acute candidiasis of vulva and vagina: Secondary | ICD-10-CM

## 2016-11-22 MED ORDER — TERCONAZOLE 0.4 % VA CREA: 1.0000 | TOPICAL_CREAM | Freq: Every day | VAGINAL | 0 refills | Status: DC

## 2016-11-22 MED ORDER — ETONOGESTREL-ETHINYL ESTRADIOL 0.12-0.015 MG/24HR VA RING: VAGINAL_RING | VAGINAL | 12 refills | Status: DC

## 2016-11-22 MED ORDER — GABAPENTIN 600 MG PO TABS: 600.0000 mg | ORAL_TABLET | Freq: Three times a day (TID) | ORAL | 11 refills | Status: DC

## 2016-11-22 MED ORDER — BUTALBITAL-APAP-CAFFEINE 50-325-40 MG PO TABS: 1.0000 | ORAL_TABLET | Freq: Four times a day (QID) | ORAL | 0 refills | Status: DC | PRN

## 2016-11-22 NOTE — Progress Notes (Signed)
Chief Complaint  Patient presents with  . 4 month follow up from hyst    stinging at incision; vaginal itching and dryness    Blood pressure 140/72, pulse 66, height 5\' 2"  (1.575 m), weight 145 lb (65.8 kg), last menstrual period 06/12/2016.  36 y.o. [id number] Patient's last menstrual period was 06/12/2016. The current method of family planning is status post hysterectomy.  Outpatient Encounter Prescriptions as of 11/22/2016  Medication Sig  . amoxicillin-clavulanate (AUGMENTIN) 875-125 MG tablet Take 1 tablet by mouth 2 (two) times daily.  Marland Kitchen escitalopram (LEXAPRO) 20 MG tablet TAKE ONE TABLET BY MOUTH ONCE DAILY  . estradiol (VIVELLE-DOT) 0.1 MG/24HR patch Place 1 patch (0.1 mg total) onto the skin 2 (two) times a week.  . gabapentin (NEURONTIN) 600 MG tablet Take 1 tablet (600 mg total) by mouth 3 (three) times daily.  . insulin aspart (NOVOLOG) 100 UNIT/ML injection Inject 10-16 Units into the skin 3 (three) times daily with meals.  . insulin glargine (LANTUS) 100 UNIT/ML injection Inject 0.3 mLs (30 Units total) into the skin at bedtime.  Marland Kitchen LORazepam (ATIVAN) 0.5 MG tablet TAKE ONE TABLET BY MOUTH AS NEEDED FOR  ANXIETY  . ondansetron (ZOFRAN) 8 MG tablet Take 1 tablet (8 mg total) by mouth every 6 (six) hours as needed for nausea.  . valACYclovir (VALTREX) 1000 MG tablet Take 1 tablet (1,000 mg total) by mouth daily.  . [DISCONTINUED] escitalopram (LEXAPRO) 20 MG tablet Take 20 mg by mouth daily.  . [DISCONTINUED] gabapentin (NEURONTIN) 300 MG capsule TAKE ONE CAPSULE BY MOUTH THREE TIMES DAILY  . [DISCONTINUED] gabapentin (NEURONTIN) 600 MG tablet Take 1 tablet (600 mg total) by mouth 3 (three) times daily. (Patient taking differently: Take 600 mg by mouth 2 (two) times daily. )  . butalbital-acetaminophen-caffeine (FIORICET, ESGIC) 50-325-40 MG tablet Take 1-2 tablets by mouth every 6 (six) hours as needed for headache.  . etonogestrel-ethinyl estradiol (NUVARING) 0.12-0.015  MG/24HR vaginal ring Insert vaginally and leave in place for 3 consecutive weeks, then remove for 1 week.  . terconazole (TERAZOL 7) 0.4 % vaginal cream Place 1 applicator vaginally at bedtime.  . [DISCONTINUED] azithromycin (ZITHROMAX Z-PAK) 250 MG tablet Take 2 tablets today and then 1 a day til finished  . [DISCONTINUED] HYDROcodone-acetaminophen (NORCO/VICODIN) 5-325 MG tablet Take 1 tablet by mouth every 6 (six) hours as needed. (Patient not taking: Reported on 09/06/2016)   No facility-administered encounter medications on file as of 11/22/2016.     Subjective Pt has burning itching of her vagina has been on several antibiotics over the winter Having some emotional issues and some hot flushes as well Has a headache in occiput and bifrontal as well, has those from time to time  Objective +yeast vulvitis and vaginitis Cuff intact Incision clean dry intact  Treated with gentian violet  Pertinent ROS No burning with urination, frequency or urgency No nausea, vomiting or diarrhea Nor fever chills or other constitutional symptoms   Labs or studies     Impression Diagnoses this Encounter::   ICD-9-CM ICD-10-CM   1. Yeast vaginitis 112.1 B37.3   2. Menopausal syndrome (hot flushes) 627.2 N95.1   3. Muscle contraction headache 307.81 G44.209     Established relevant diagnosis(es):   Plan/Recommendations: Meds ordered this encounter  Medications  . amoxicillin-clavulanate (AUGMENTIN) 875-125 MG tablet    Sig: Take 1 tablet by mouth 2 (two) times daily.  Marland Kitchen terconazole (TERAZOL 7) 0.4 % vaginal cream    Sig: Place  1 applicator vaginally at bedtime.    Dispense:  45 g    Refill:  0  . etonogestrel-ethinyl estradiol (NUVARING) 0.12-0.015 MG/24HR vaginal ring    Sig: Insert vaginally and leave in place for 3 consecutive weeks, then remove for 1 week.    Dispense:  1 each    Refill:  12  . gabapentin (NEURONTIN) 600 MG tablet    Sig: Take 1 tablet (600 mg total) by mouth 3  (three) times daily.    Dispense:  90 tablet    Refill:  11  . butalbital-acetaminophen-caffeine (FIORICET, ESGIC) 50-325-40 MG tablet    Sig: Take 1-2 tablets by mouth every 6 (six) hours as needed for headache.    Dispense:  20 tablet    Refill:  0    Labs or Scans Ordered: No orders of the defined types were placed in this encounter.   Management:: terazol 7 qhs x 7 nuvaring for vaginal dryness symptoms and her vasomotor symtoms A few fioricet for her muscle contaction headaches  Follow up Return in about 2 months (around 01/22/2017) for Follow up, with Dr Despina HiddenEure.           All questions were answered.  Past Medical History:  Diagnosis Date  . Anemia   . Anxiety   . Constipation   . Diabetes mellitus without complication (HCC)   . Family history of malignant neoplasm of gastrointestinal tract   . Gastroparesis   . IBS (irritable bowel syndrome)    constipation  . Neuropathy due to type 2 diabetes mellitus [facility](HCC)     Past Surgical History:  Procedure Laterality Date  . ABDOMINAL HYSTERECTOMY N/A 06/29/2016   Procedure: TOTAL ABDOMINAL HYSTERECTOMY;  Surgeon: Lazaro ArmsLuther H Eure, MD;  Location: [facility];  Service: Gynecology;  Laterality: N/A;  . CESAREAN SECTION    . CHOLECYSTECTOMY  2007  . EYE SURGERY  1989  . SALPINGOOPHORECTOMY Bilateral 06/29/2016   Procedure: BILATERAL SALPINGO OOPHORECTOMY;  Surgeon: Lazaro ArmsLuther H Eure, MD;  Location: [facility];  Service: Gynecology;  Laterality: Bilateral;  . TONSILLECTOMY  1992    OB History    Gravida Para Term Preterm AB Living   2 1 1   1 1    SAB TAB Ectopic Multiple Live Births   1       1      Allergies  Allergen Reactions  . Celebrex [Celecoxib]   . Latex   . Sulfonamide Derivatives     Social History   Social History  . Marital status: Single    Spouse name: N/A  . Number of children: N/A  . Years of education: N/A   Social History Main Topics  . Smoking status: Former Smoker    Years: 10.00    Types:  Cigarettes    Quit date: 06/24/2014  . Smokeless tobacco: Never Used  . Alcohol use Yes     Comment: once in a while  . Drug use: No  . Sexual activity: Not Currently    Birth control/ protection: Surgical     Comment: hyst   Other Topics Concern  . None   Social History Narrative  . None    Family History  Problem Relation Age of Onset  . Other Paternal Grandfather     fell down stairs  . Other Paternal Grandmother     fell down stairs  . Alzheimer's disease Maternal Grandmother   . Dementia Maternal Grandmother   . Colon cancer Maternal Grandfather   . Other  Mother     back pain; nerve damage  . Hypertension Mother   . Hemophilia Son

## 2016-11-30 ENCOUNTER — Telehealth: Payer: Self-pay | Admitting: *Deleted

## 2016-11-30 NOTE — Telephone Encounter (Signed)
Sounds more like a kidney stone but if she wants she can come in and we can check a urine and do a culture

## 2016-11-30 NOTE — Telephone Encounter (Signed)
Patient called wanting me to inform you that she has had intermittent lower back pain for the past 2 days along with urinary frequency, burning and bloody in color. She is unsure if she has another bladder infection or possibly passing another kidney stone. Please advise.

## 2016-12-01 ENCOUNTER — Encounter: Payer: Self-pay | Admitting: *Deleted

## 2016-12-07 ENCOUNTER — Other Ambulatory Visit: Payer: Self-pay | Admitting: Advanced Practice Midwife

## 2016-12-13 ENCOUNTER — Encounter: Payer: Self-pay | Admitting: Obstetrics & Gynecology

## 2017-01-16 ENCOUNTER — Encounter: Payer: Self-pay | Admitting: Obstetrics & Gynecology

## 2017-01-17 ENCOUNTER — Other Ambulatory Visit: Payer: Self-pay | Admitting: Obstetrics & Gynecology

## 2017-01-17 MED ORDER — FLUCONAZOLE 150 MG PO TABS: 150.0000 mg | ORAL_TABLET | Freq: Once | ORAL | 3 refills | Status: AC

## 2017-01-18 DIAGNOSIS — Z832 Family history of diseases of the blood and blood-forming organs and certain disorders involving the immune mechanism: Secondary | ICD-10-CM | POA: Insufficient documentation

## 2017-01-23 ENCOUNTER — Ambulatory Visit (INDEPENDENT_AMBULATORY_CARE_PROVIDER_SITE_OTHER): Payer: Medicaid Other | Admitting: Obstetrics & Gynecology

## 2017-01-23 ENCOUNTER — Encounter: Payer: Self-pay | Admitting: Obstetrics & Gynecology

## 2017-01-23 VITALS — BP 118/70 | HR 76 | Wt 139.4 lb

## 2017-01-23 DIAGNOSIS — N644 Mastodynia: Secondary | ICD-10-CM

## 2017-01-23 DIAGNOSIS — N951 Menopausal and female climacteric states: Secondary | ICD-10-CM | POA: Diagnosis not present

## 2017-01-23 MED ORDER — ESTRADIOL 1 MG PO TABS: 1.0000 mg | ORAL_TABLET | Freq: Every day | ORAL | 11 refills | Status: DC

## 2017-01-23 MED ORDER — ESTRADIOL 0.1 MG/24HR TD PTTW: 1.0000 | MEDICATED_PATCH | TRANSDERMAL | 12 refills | Status: DC

## 2017-01-23 NOTE — Progress Notes (Signed)
Chief Complaint  Patient presents with  . folow-up    medications    Blood pressure 118/70, pulse 76, weight 139 lb 6.4 oz (63.2 kg), last menstrual period 06/12/2016.  36 y.o. G2P1011 Patient's last menstrual period was 06/12/2016. The current method of family planning is status post hysterectomy.  Outpatient Encounter Prescriptions as of 01/23/2017  Medication Sig Note  . butalbital-acetaminophen-caffeine (FIORICET, ESGIC) 50-325-40 MG tablet Take 1-2 tablets by mouth every 6 (six) hours as needed for headache.   . escitalopram (LEXAPRO) 20 MG tablet TAKE ONE TABLET BY MOUTH ONCE DAILY   . estradiol (VIVELLE-DOT) 0.1 MG/24HR patch Place 1 patch (0.1 mg total) onto the skin 2 (two) times a week.   . gabapentin (NEURONTIN) 600 MG tablet Take 1 tablet (600 mg total) by mouth 3 (three) times daily.   . insulin aspart (NOVOLOG) 100 UNIT/ML injection Inject 10-16 Units into the skin 3 (three) times daily with meals.   . insulin glargine (LANTUS) 100 UNIT/ML injection Inject 0.3 mLs (30 Units total) into the skin at bedtime.   Marland Kitchen. LORazepam (ATIVAN) 0.5 MG tablet TAKE ONE TABLET BY MOUTH AS NEEDED FOR  ANXIETY   . valACYclovir (VALTREX) 1000 MG tablet TAKE ONE TABLET BY MOUTH ONCE DAILY   . [DISCONTINUED] estradiol (VIVELLE-DOT) 0.1 MG/24HR patch Place 1 patch (0.1 mg total) onto the skin 2 (two) times a week.   . [DISCONTINUED] etonogestrel-ethinyl estradiol (NUVARING) 0.12-0.015 MG/24HR vaginal ring Insert vaginally and leave in place for 3 consecutive weeks, then remove for 1 week. 01/23/2017: Breast tenderness  . estradiol (ESTRACE) 1 MG tablet Take 1 tablet (1 mg total) by mouth daily.   . [DISCONTINUED] amoxicillin-clavulanate (AUGMENTIN) 875-125 MG tablet Take 1 tablet by mouth 2 (two) times daily.   . [DISCONTINUED] ondansetron (ZOFRAN) 8 MG tablet Take 1 tablet (8 mg total) by mouth every 6 (six) hours as needed for nausea.   . [DISCONTINUED] terconazole (TERAZOL 7) 0.4 % vaginal  cream Place 1 applicator vaginally at bedtime.    No facility-administered encounter medications on file as of 01/23/2017.     Subjective Pt has had very sore tender whole breasts nipples basically the same symptoms No discharge No mastitis  Objective   Pertinent ROS No burning with urination, frequency or urgency No nausea, vomiting or diarrhea Nor fever chills or other constitutional symptoms   Labs or studies     Impression Diagnoses this Encounter::   ICD-9-CM ICD-10-CM   1. Menopausal symptoms 627.2 N95.1   2. 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 amoxicillin-clavulanate (AUGMENTIN) 875-125 MG tablet Error  . ondansetron (ZOFRAN) 8 MG tablet Error  . terconazole (TERAZOL 7) 0.4 % vaginal cream Error  . etonogestrel-ethinyl estradiol (NUVARING) 0.12-0.015 MG/24HR vaginal ring Side effect (s)  . estradiol (VIVELLE-DOT) 0.1 MG/24HR patch Reorder   Meds ordered this encounter  Medications  . estradiol (VIVELLE-DOT) 0.1 MG/24HR patch    Sig: Place 1 patch (0.1 mg total) onto the skin 2 (two) times a week.    Dispense:  8 patch    Refill:  12  . estradiol (ESTRACE) 1 MG tablet    Sig: Take 1 tablet (1 mg total) by mouth daily.    Dispense:  30 tablet    Refill:  11    Labs or Scans Ordered: No orders of the defined types were placed in this encounter.  Management:: Will stop her nuva ring due to mastodynia Add oral estrogen to mitigate her menopausal symptoms  Follow up Return if symptoms worsen or fail to improve.        Face to face time:  15 minutes  Greater than 50% of the visit time was spent in counseling and coordination of care with the patient.  The summary and outline of the counseling and care coordination is summarized in the note above.   All questions were answered.  Past Medical History:    Diagnosis Date  . Anemia   . Anxiety   . Constipation   . Diabetes mellitus without complication (HCC)   . Family history of malignant neoplasm of gastrointestinal tract   . Gastroparesis   . IBS (irritable bowel syndrome)    constipation  . Neuropathy due to type 2 diabetes mellitus [facility])     Past Surgical History:  Procedure Laterality Date  . ABDOMINAL HYSTERECTOMY N/A 06/29/2016   Procedure: TOTAL ABDOMINAL HYSTERECTOMY;  Surgeon: Lazaro Arms, MD;  Location: [facility] ORS;  Service: Gynecology;  Laterality: N/A;  . CESAREAN SECTION    . CHOLECYSTECTOMY  2007  . EYE SURGERY  1989  . SALPINGOOPHORECTOMY Bilateral 06/29/2016   Procedure: BILATERAL SALPINGO OOPHORECTOMY;  Surgeon: Lazaro Arms, MD;  Location: [facility];  Service: Gynecology;  Laterality: Bilateral;  . TONSILLECTOMY  1992    OB History    Gravida Para Term Preterm AB Living   2 1 1   1 1    SAB TAB Ectopic Multiple Live Births   1       1      Allergies  Allergen Reactions  . Celebrex [Celecoxib]   . Latex   . Sulfonamide Derivatives     Social History   Social History  . Marital status: Single    Spouse name: N/A  . Number of children: N/A  . Years of education: N/A   Social History Main Topics  . Smoking status: Former Smoker    Years: 10.00    Types: Cigarettes    Quit date: 06/24/2014  . Smokeless tobacco: Never Used  . Alcohol use Yes     Comment: once in a while  . Drug use: No  . Sexual activity: Not Currently    Birth control/ protection: Surgical     Comment: hyst   Other Topics Concern  . None   Social History Narrative  . None    Family History  Problem Relation Age of Onset  . Other Paternal Grandfather     fell down stairs  . Other Paternal Grandmother     fell down stairs  . Alzheimer's disease Maternal Grandmother   . Dementia Maternal Grandmother   . Colon cancer Maternal Grandfather   . Other Mother     back pain; nerve damage  . Hypertension Mother   .  Hemophilia Son

## 2017-01-25 ENCOUNTER — Other Ambulatory Visit: Payer: Self-pay | Admitting: Obstetrics & Gynecology

## 2017-01-25 ENCOUNTER — Encounter: Payer: Self-pay | Admitting: Obstetrics & Gynecology

## 2017-01-25 MED ORDER — FERRALET 90 90-1 MG PO TABS: 1.0000 | ORAL_TABLET | Freq: Every day | ORAL | 11 refills | Status: DC

## 2017-01-27 ENCOUNTER — Encounter: Payer: Self-pay | Admitting: Obstetrics & Gynecology

## 2017-01-31 ENCOUNTER — Other Ambulatory Visit: Payer: Self-pay | Admitting: Obstetrics & Gynecology

## 2017-03-09 ENCOUNTER — Encounter (HOSPITAL_COMMUNITY): Payer: Self-pay | Admitting: Cardiology

## 2017-03-09 ENCOUNTER — Emergency Department (HOSPITAL_COMMUNITY)
Admission: EM | Admit: 2017-03-09 | Discharge: 2017-03-09 | Disposition: A | Discharge status: Home / Self Care | Payer: Medicaid Other | Attending: Emergency Medicine | Admitting: Emergency Medicine

## 2017-03-09 DIAGNOSIS — Z79899 Other long term (current) drug therapy: Secondary | ICD-10-CM | POA: Insufficient documentation

## 2017-03-09 DIAGNOSIS — R05 Cough: Secondary | ICD-10-CM | POA: Insufficient documentation

## 2017-03-09 DIAGNOSIS — J209 Acute bronchitis, unspecified: Secondary | ICD-10-CM | POA: Insufficient documentation

## 2017-03-09 DIAGNOSIS — Z91040 Latex allergy status: Secondary | ICD-10-CM | POA: Diagnosis not present

## 2017-03-09 DIAGNOSIS — J0100 Acute maxillary sinusitis, unspecified: Secondary | ICD-10-CM

## 2017-03-09 DIAGNOSIS — H9203 Otalgia, bilateral: Secondary | ICD-10-CM | POA: Diagnosis present

## 2017-03-09 DIAGNOSIS — Z794 Long term (current) use of insulin: Secondary | ICD-10-CM | POA: Insufficient documentation

## 2017-03-09 DIAGNOSIS — Z87891 Personal history of nicotine dependence: Secondary | ICD-10-CM | POA: Diagnosis not present

## 2017-03-09 DIAGNOSIS — E1140 Type 2 diabetes mellitus with diabetic neuropathy, unspecified: Secondary | ICD-10-CM | POA: Diagnosis not present

## 2017-03-09 MED ORDER — LORATADINE-PSEUDOEPHEDRINE ER 5-120 MG PO TB12: 1.0000 | ORAL_TABLET | Freq: Two times a day (BID) | ORAL | 0 refills | Status: DC

## 2017-03-09 MED ORDER — ALBUTEROL SULFATE HFA 108 (90 BASE) MCG/ACT IN AERS
2.0000 | INHALATION_SPRAY | Freq: Once | RESPIRATORY_TRACT | Status: AC
  Administered 2017-03-09: 2 via RESPIRATORY_TRACT
  Filled 2017-03-09: qty 6.7

## 2017-03-09 MED ORDER — DEXAMETHASONE 4 MG PO TABS: 4.0000 mg | ORAL_TABLET | Freq: Two times a day (BID) | ORAL | 0 refills | Status: DC

## 2017-03-09 NOTE — ED Provider Notes (Signed)
[facility] Provider Note   CSN: [id number] Arrival date & time: 03/09/17  1117     History   Chief Complaint Chief Complaint  Patient presents with  . Ear Pain  . Cough    HPI ERIELLE GAWRONSKI is a 36 y.o. female.  Patient is a 36 year old female who presents to the emergency department with complaint of bilateral ear pain. She has had left ear pain for nearly a month. However the last 3 or 4 days she's been having pain in both knees. She complains of a cough that is nonproductive. His been no hemoptysis reported. His been no drainage from the ears. No reported high fever.   The history is provided by the patient.    Past Medical History:  Diagnosis Date  . Anemia   . Anxiety   . Constipation   . Diabetes mellitus without complication (HCC)   . Family history of malignant neoplasm of gastrointestinal tract   . Gastroparesis   . IBS (irritable bowel syndrome)    constipation  . Neuropathy due to type 2 diabetes mellitus [facility])     Patient Active Problem List   Diagnosis Date Noted  . S/P TAH-BSO 06/29/2016  . Diabetes (HCC) 06/19/2013  . GERD 05/07/2010  . ABDOMINAL PAIN, LEFT LOWER QUADRANT 04/09/2010  . DEPRESSION 03/08/2010  . CONSTIPATION 03/08/2010  . HEMORRHAGE OF RECTUM AND ANUS 03/08/2010    Past Surgical History:  Procedure Laterality Date  . ABDOMINAL HYSTERECTOMY N/A 06/29/2016   Procedure: TOTAL ABDOMINAL HYSTERECTOMY;  Surgeon: Lazaro Arms, MD;  Location: [facility];  Service: Gynecology;  Laterality: N/A;  . CESAREAN SECTION    . CHOLECYSTECTOMY  2007  . EYE SURGERY  1989  . SALPINGOOPHORECTOMY Bilateral 06/29/2016   Procedure: BILATERAL SALPINGO OOPHORECTOMY;  Surgeon: Lazaro Arms, MD;  Location: [facility];  Service: Gynecology;  Laterality: Bilateral;  . TONSILLECTOMY  1992    OB History    Gravida Para Term Preterm AB Living   2 1 1   1 1    SAB TAB Ectopic Multiple Live Births   1       1       Home Medications    Prior to  Admission medications   Medication Sig Start Date End Date Taking? Authorizing Provider  butalbital-acetaminophen-caffeine (FIORICET, ESGIC) 50-325-40 MG tablet Take 1-2 tablets by mouth every 6 (six) hours as needed for headache. 11/22/16 11/22/17  Lazaro Arms, MD  dexamethasone (DECADRON) 4 MG tablet Take 1 tablet (4 mg total) by mouth 2 (two) times daily with a meal. 03/09/17   Ivery Quale, PA-C  escitalopram (LEXAPRO) 20 MG tablet TAKE ONE TABLET BY MOUTH ONCE DAILY 11/06/16   Lazaro Arms, MD  estradiol (ESTRACE) 1 MG tablet Take 1 tablet (1 mg total) by mouth daily. 01/23/17   Lazaro Arms, MD  estradiol (VIVELLE-DOT) 0.1 MG/24HR patch Place 1 patch (0.1 mg total) onto the skin 2 (two) times a week. 01/23/17   Lazaro Arms, MD  Fe Cbn-Fe Gluc-FA-B12-C-DSS (FERRALET 90) 90-1 MG TABS Take 1 tablet by mouth daily. 01/25/17   Lazaro Arms, MD  gabapentin (NEURONTIN) 600 MG tablet Take 1 tablet (600 mg total) by mouth 3 (three) times daily. 11/22/16   Lazaro Arms, MD  insulin aspart (NOVOLOG) 100 UNIT/ML injection Inject 10-16 Units into the skin 3 (three) times daily with meals. 10/05/15   Roma Kayser, MD  insulin glargine (LANTUS) 100 UNIT/ML injection Inject 0.3 mLs (  30 Units total) into the skin at bedtime. 10/05/15   Roma Kayser, MD  loratadine-pseudoephedrine (CLARITIN-D 12 HOUR) 5-120 MG tablet Take 1 tablet by mouth 2 (two) times daily. 03/09/17   Ivery Quale, PA-C  LORazepam (ATIVAN) 0.5 MG tablet TAKE ONE TABLET BY MOUTH AS NEEDED FOR  ANXIETY 10/10/16   Lazaro Arms, MD  valACYclovir (VALTREX) 1000 MG tablet TAKE ONE TABLET BY MOUTH ONCE DAILY 12/07/16   Jacklyn Shell, CNM    Family History Family History  Problem Relation Age of Onset  . Other Paternal Grandfather        fell down stairs  . Other Paternal Grandmother        fell down stairs  . Alzheimer's disease Maternal Grandmother   . Dementia Maternal Grandmother   . Colon cancer Maternal  Grandfather   . Other Mother        back pain; nerve damage  . Hypertension Mother   . Hemophilia Son     Social History Social History  Substance Use Topics  . Smoking status: Former Smoker    Years: 10.00    Types: Cigarettes    Quit date: 06/24/2014  . Smokeless tobacco: Never Used  . Alcohol use Yes     Comment: once in a while     Allergies   Celebrex [celecoxib]; Latex; and Sulfonamide derivatives   Review of Systems Review of Systems  Constitutional: Negative for activity change, appetite change, chills and fever.  HENT: Positive for congestion, ear pain and sinus pressure. Negative for ear discharge, facial swelling, nosebleeds, rhinorrhea, sneezing and tinnitus.   Eyes: Negative for photophobia, pain and discharge.  Respiratory: Positive for cough. Negative for choking, shortness of breath and wheezing.   Cardiovascular: Negative for chest pain, palpitations and leg swelling.  Gastrointestinal: Negative for abdominal pain, blood in stool, constipation, diarrhea, nausea and vomiting.  Genitourinary: Negative for difficulty urinating, dysuria, flank pain, frequency and hematuria.  Musculoskeletal: Negative for back pain, gait problem, myalgias and neck pain.  Skin: Negative for color change, rash and wound.  Neurological: Negative for dizziness, seizures, syncope, facial asymmetry, speech difficulty, weakness and numbness.  Hematological: Negative for adenopathy. Does not bruise/bleed easily.  Psychiatric/Behavioral: Negative for agitation, confusion, hallucinations, self-injury and suicidal ideas. The patient is not nervous/anxious.      Physical Exam Updated Vital Signs BP (!) 154/89 (BP Location: Right Arm)   Pulse 65   Temp 97.9 F (36.6 C) (Oral)   Resp 18   Ht 5\' 2"  (1.575 m)   Wt 62.6 kg (138 lb)   LMP 06/12/2016   SpO2 98%   BMI 25.24 kg/m   Physical Exam  Constitutional: She is oriented to person, place, and time. She appears well-developed and  well-nourished.  Non-toxic appearance.  HENT:  Head: Normocephalic.  Right Ear: Tympanic membrane and external ear normal.  Left Ear: Tympanic membrane and external ear normal.  There is pain to percussion of the maxillary sinus of the left. The turbinates are swollen. Her graft there is some fluid behind the drum on the left greater than the right. No bulging of the drum. No mastoid swelling, redness, or tenderness.  The airway is patent. Speech is clear and understandable.  Eyes: EOM and lids are normal. Pupils are equal, round, and reactive to light.  Neck: Normal range of motion. Neck supple. Carotid bruit is not present.  Cardiovascular: Normal rate, regular rhythm, normal heart sounds, intact distal pulses and normal pulses.   Pulmonary/Chest:  Breath sounds normal. No respiratory distress.  Course breath sounds present with occasional rhonchi.  Abdominal: Soft. Bowel sounds are normal. There is no tenderness. There is no guarding.  Musculoskeletal: Normal range of motion.  Lymphadenopathy:       Head (right side): No submandibular adenopathy present.       Head (left side): No submandibular adenopathy present.    She has no cervical adenopathy.  Neurological: She is alert and oriented to person, place, and time. She has normal strength. No cranial nerve deficit or sensory deficit.  Skin: Skin is warm and dry.  Psychiatric: She has a normal mood and affect. Her speech is normal.  Nursing note and vitals reviewed.    ED Treatments / Results  Labs (all labs ordered are listed, but only abnormal results are displayed) Labs Reviewed - No data to display  EKG  EKG Interpretation None       Radiology No results found.  Procedures Procedures (including critical care time)  Medications Ordered in ED Medications  albuterol (PROVENTIL HFA;VENTOLIN HFA) 108 (90 Base) MCG/ACT inhaler 2 puff (not administered)     Initial Impression / Assessment and Plan / ED Course  I have  reviewed the triage vital signs and the nursing notes.  Pertinent labs & imaging results that were available during my care of the patient were reviewed by me and considered in my medical decision making (see chart for details).       Final Clinical Impressions(s) / ED Diagnoses MDm Vital signs within normal limits. The examination favors an acute maxillary sinusitis and bronchitis.  The patient will be treated with Decadron, albuterol, Claritin-D, and I've asked the patient to use Tylenol and/or ibuprofen for discomfort. I've asked her to monitor temperature closely. I have asked patient to increase fluids. Patient acknowledges understanding of instructions.    Final diagnoses:  Acute maxillary sinusitis, recurrence not specified  Acute bronchitis, unspecified organism    New Prescriptions New Prescriptions   DEXAMETHASONE (DECADRON) 4 MG TABLET    Take 1 tablet (4 mg total) by mouth 2 (two) times daily with a meal.   LORATADINE-PSEUDOEPHEDRINE (CLARITIN-D 12 HOUR) 5-120 MG TABLET    Take 1 tablet by mouth 2 (two) times daily.     Ivery QualeBryant, Veleka Djordjevic, PA-C 03/10/17 [id number]    Donnetta Hutchingook, Brian, MD 03/11/17 [phone]

## 2017-03-09 NOTE — ED Triage Notes (Signed)
Left ear pain times one month.  Since Monday bilateral ear pain and   Chest congestion with non -productive cough.

## 2017-03-09 NOTE — Discharge Instructions (Signed)
Your vital signs within normal limits. Your pulse oximetry is 98% on room air. Your examination suggest sinusitis and bronchitis. Please increase fluids. Please wash hands frequently. Please use Claritin-D every 12 hours for decongesting the sinus pressure. Use 2 puffs of albuterol every 4 hours as needed for cough, and/or chest congestion. Use Decadron 2 times daily with food. Please see Dr.Nyland for follow-up and recheck in the office.

## 2017-03-30 ENCOUNTER — Other Ambulatory Visit: Payer: Self-pay | Admitting: Obstetrics & Gynecology

## 2017-04-02 ENCOUNTER — Encounter: Payer: Self-pay | Admitting: Obstetrics & Gynecology

## 2017-04-04 ENCOUNTER — Ambulatory Visit (INDEPENDENT_AMBULATORY_CARE_PROVIDER_SITE_OTHER): Payer: Medicaid Other | Admitting: Obstetrics & Gynecology

## 2017-04-04 ENCOUNTER — Encounter: Payer: Self-pay | Admitting: Obstetrics & Gynecology

## 2017-04-04 VITALS — BP 138/92 | HR 91 | Ht 62.0 in | Wt 139.0 lb

## 2017-04-04 DIAGNOSIS — F5102 Adjustment insomnia: Secondary | ICD-10-CM

## 2017-04-04 DIAGNOSIS — E119 Type 2 diabetes mellitus without complications: Secondary | ICD-10-CM

## 2017-04-04 DIAGNOSIS — L02429 Furuncle of limb, unspecified: Secondary | ICD-10-CM

## 2017-04-04 DIAGNOSIS — F410 Panic disorder [episodic paroxysmal anxiety] without agoraphobia: Secondary | ICD-10-CM

## 2017-04-04 DIAGNOSIS — L02439 Carbuncle of limb, unspecified: Secondary | ICD-10-CM

## 2017-04-04 MED ORDER — ALPRAZOLAM 1 MG PO TABS: 1.0000 mg | ORAL_TABLET | Freq: Three times a day (TID) | ORAL | 0 refills | Status: DC | PRN

## 2017-04-04 MED ORDER — SILVER SULFADIAZINE 1 % EX CREA: TOPICAL_CREAM | CUTANEOUS | 11 refills | Status: DC

## 2017-04-04 MED ORDER — ZOLPIDEM TARTRATE 10 MG PO TABS: 10.0000 mg | ORAL_TABLET | Freq: Every evening | ORAL | 0 refills | Status: DC | PRN

## 2017-04-04 MED ORDER — DOXYCYCLINE HYCLATE 100 MG PO TABS: 100.0000 mg | ORAL_TABLET | Freq: Two times a day (BID) | ORAL | 0 refills | Status: DC

## 2017-04-04 NOTE — Progress Notes (Signed)
Chief Complaint  Patient presents with  . feels lump under right arm    hurts to move arm; smaller lump under left arm    Blood pressure (!) 138/92, pulse 91, height 5\' 2"  (1.575 m), weight 139 lb (63 kg), last menstrual period 06/12/2016.  36 y.o. G2P1011 Patient's last menstrual period was 06/12/2016. The current method of family planning is status post hysterectomy.  Outpatient Encounter Medications as of 04/04/2017  Medication Sig  . escitalopram (LEXAPRO) 20 MG tablet TAKE ONE TABLET BY MOUTH ONCE DAILY  . estradiol (ESTRACE) 1 MG tablet Take 1 tablet (1 mg total) by mouth daily.  Marland Kitchen estradiol (VIVELLE-DOT) 0.1 MG/24HR patch Place 1 patch (0.1 mg total) onto the skin 2 (two) times a week.  . gabapentin (NEURONTIN) 600 MG tablet Take 1 tablet (600 mg total) by mouth 3 (three) times daily.  Marland Kitchen LORazepam (ATIVAN) 0.5 MG tablet TAKE ONE TABLET BY MOUTH AS NEEDED FOR ANXIETY  . valACYclovir (VALTREX) 1000 MG tablet TAKE ONE TABLET BY MOUTH ONCE DAILY  . [DISCONTINUED] butalbital-acetaminophen-caffeine (FIORICET, ESGIC) 50-325-40 MG tablet Take 1-2 tablets by mouth every 6 (six) hours as needed for headache.  . [DISCONTINUED] insulin aspart (NOVOLOG) 100 UNIT/ML injection Inject 10-16 Units into the skin 3 (three) times daily with meals.  . [DISCONTINUED] insulin glargine (LANTUS) 100 UNIT/ML injection Inject 0.3 mLs (30 Units total) into the skin at bedtime.  Marland Kitchen doxycycline (VIBRA-TABS) 100 MG tablet Take 1 tablet (100 mg total) by mouth 2 (two) times daily.  . silver sulfADIAZINE (SILVADENE) 1 % cream Apply 3-4 times daily  . [DISCONTINUED] ALPRAZolam (XANAX) 1 MG tablet Take 1 tablet (1 mg total) by mouth 3 (three) times daily as needed for anxiety.  . [DISCONTINUED] dexamethasone (DECADRON) 4 MG tablet Take 1 tablet (4 mg total) by mouth 2 (two) times daily with a meal.  . [DISCONTINUED] dexamethasone (DECADRON) 4 MG tablet Take 1 tablet (4 mg total) by mouth 2 (two) times daily  with a meal.  . [DISCONTINUED] Fe Cbn-Fe Gluc-FA-B12-C-DSS (FERRALET 90) 90-1 MG TABS Take 1 tablet by mouth daily.  . [DISCONTINUED] loratadine-pseudoephedrine (CLARITIN-D 12 HOUR) 5-120 MG tablet Take 1 tablet by mouth 2 (two) times daily.  . [DISCONTINUED] loratadine-pseudoephedrine (CLARITIN-D 12 HOUR) 5-120 MG tablet Take 1 tablet by mouth 2 (two) times daily.  . [DISCONTINUED] zolpidem (AMBIEN) 10 MG tablet Take 1 tablet (10 mg total) by mouth at bedtime as needed for sleep.   No facility-administered encounter medications on file as of 04/04/2017.     Subjective [name] presents to the office today complaining of several day history of a tender erythematous area under her left axilla Patient states that the pain is mild to moderate and is throbbing in nature, like a toothache She says her blood sugars have been under decent control She denies any fever or other constitutional symptoms She has not performed any local care  Objective Blood pressure (!) 138/92, pulse 91, height 5\' 2"  (1.575 m), weight 139 lb (63 kg), last menstrual period 06/12/2016.  Gen well-developed well-nourished female no acute distress From the left axilla there appears to be a follicular abscess approximately 2 x 2 cm with surrounding induration this is not part of the breast is clearly in the skin of the axilla  Pertinent ROS No fevers chills body aches No nausea vomiting diarrhea No shortness of breath no tingling neurological symptoms  Labs or studies     Impression Diagnoses this Encounter::  ICD-10-CM   1. Boil, axilla L02.439   2. Panic attack F41.0    Grandmother passed away  3. Adjustment insomnia F51.02     Established relevant diagnosis(es):   Plan/Recommendations: Meds ordered this encounter  Medications  . DISCONTD: ALPRAZolam (XANAX) 1 MG tablet    Sig: Take 1 tablet (1 mg total) by mouth 3 (three) times daily as needed for anxiety.    Dispense:  60 tablet    Refill:   0  . DISCONTD: zolpidem (AMBIEN) 10 MG tablet    Sig: Take 1 tablet (10 mg total) by mouth at bedtime as needed for sleep.    Dispense:  30 tablet    Refill:  0  . doxycycline (VIBRA-TABS) 100 MG tablet    Sig: Take 1 tablet (100 mg total) by mouth 2 (two) times daily.    Dispense:  20 tablet    Refill:  0  . silver sulfADIAZINE (SILVADENE) 1 % cream    Sig: Apply 3-4 times daily    Dispense:  50 g    Refill:  11    Labs or Scans Ordered: No orders of the defined types were placed in this encounter.   Management:: Patient has a follicular abscess and was treated with doxycycline for the next 10 days, of course she is diabetic which makes in a simple infection like this potentially more complicated I will see her back in about a week and if she has any problems in the meantime she will give me a call  Follow up Return in about 1 week (around 04/11/2017) for Follow up, with Dr Despina HiddenEure.         All questions were answered.  Past Medical History:  Diagnosis Date  . Anemia   . Anxiety   . Constipation   . Diabetes mellitus without complication (HCC)   . Family history of malignant neoplasm of gastrointestinal tract   . Gastroparesis   . IBS (irritable bowel syndrome)    constipation  . Migraines   . Neuropathy due to type 2 diabetes mellitus [facility](HCC)     Past Surgical History:  Procedure Laterality Date  . CESAREAN SECTION    . CHOLECYSTECTOMY  2007  . EYE SURGERY  1989  . TONSILLECTOMY  1992    OB History    Gravida Para Term Preterm AB Living   2 1 1   1 1    SAB TAB Ectopic Multiple Live Births   1       1      Allergies  Allergen Reactions  . Celebrex [Celecoxib]   . Latex   . Sulfonamide Derivatives     Social History   Socioeconomic History  . Marital status: Single    Spouse name: None  . Number of children: None  . Years of education: None  . Highest education level: None  Social Needs  . Financial resource strain: None  . Food insecurity -  worry: None  . Food insecurity - inability: None  . Transportation needs - medical: None  . Transportation needs - non-medical: None  Occupational History  . None  Tobacco Use  . Smoking status: Former Smoker    Years: 10.00    Types: Cigarettes    Last attempt to quit: 06/24/2014    Years since quitting: 3.0  . Smokeless tobacco: Never Used  Substance and Sexual Activity  . Alcohol use: Yes    Comment: once in a while  . Drug use: No  .  Sexual activity: Not Currently    Birth control/protection: Surgical    Comment: hyst  Other Topics Concern  . None  Social History Narrative  . None    Family History  Problem Relation Age of Onset  . Other Paternal Grandfather        fell down stairs  . Other Paternal Grandmother        fell down stairs  . Alzheimer's disease Maternal Grandmother   . Dementia Maternal Grandmother   . Colon cancer Maternal Grandfather   . Other Mother        back pain; nerve damage  . Hypertension Mother   . Hemophilia Son

## 2017-04-05 ENCOUNTER — Encounter: Payer: Self-pay | Admitting: Obstetrics & Gynecology

## 2017-04-05 ENCOUNTER — Telehealth: Payer: Self-pay | Admitting: *Deleted

## 2017-04-05 NOTE — Telephone Encounter (Signed)
It will not be a problem, trust me, tell them to fill it, if for some reason it is it is just a topical anyway I have never had a sulfa allergic patient not be able to take silvadene cream

## 2017-04-05 NOTE — Telephone Encounter (Signed)
Pharmacy will not fill medication. Patient is allergic to sulfa. Please advise.

## 2017-04-06 ENCOUNTER — Telehealth: Payer: Self-pay | Admitting: *Deleted

## 2017-04-06 NOTE — Telephone Encounter (Signed)
Pharmacy called and informed to fill silvadene cream per Dr Despina HiddenEure.

## 2017-04-13 ENCOUNTER — Ambulatory Visit: Payer: Medicaid Other | Admitting: Obstetrics & Gynecology

## 2017-04-25 ENCOUNTER — Ambulatory Visit: Payer: Medicaid Other | Admitting: Obstetrics & Gynecology

## 2017-05-28 ENCOUNTER — Other Ambulatory Visit: Payer: Self-pay | Admitting: Obstetrics & Gynecology

## 2017-05-29 ENCOUNTER — Other Ambulatory Visit: Payer: Self-pay | Admitting: Obstetrics & Gynecology

## 2017-05-29 MED ORDER — ALPRAZOLAM 1 MG PO TABS: 1.0000 mg | ORAL_TABLET | Freq: Three times a day (TID) | ORAL | 3 refills | Status: DC | PRN

## 2017-05-29 MED ORDER — ZOLPIDEM TARTRATE 10 MG PO TABS: 10.0000 mg | ORAL_TABLET | Freq: Every evening | ORAL | 3 refills | Status: DC | PRN

## 2017-06-06 ENCOUNTER — Encounter: Payer: Self-pay | Admitting: Obstetrics & Gynecology

## 2017-06-06 ENCOUNTER — Other Ambulatory Visit: Payer: Self-pay | Admitting: Obstetrics & Gynecology

## 2017-06-06 MED ORDER — INSULIN GLARGINE 100 UNIT/ML ~~LOC~~ SOLN: 30.0000 [IU] | Freq: Every day | SUBCUTANEOUS | 12 refills | Status: DC

## 2017-06-06 MED ORDER — INSULIN ASPART 100 UNIT/ML ~~LOC~~ SOLN: 10.0000 [IU] | Freq: Three times a day (TID) | SUBCUTANEOUS | 12 refills | Status: DC

## 2017-07-06 ENCOUNTER — Other Ambulatory Visit: Payer: Self-pay | Admitting: Obstetrics & Gynecology

## 2017-07-07 MED ORDER — BUTALBITAL-APAP-CAFFEINE 50-325-40 MG PO TABS: 1.0000 | ORAL_TABLET | Freq: Four times a day (QID) | ORAL | 2 refills | Status: DC | PRN

## 2017-07-17 ENCOUNTER — Telehealth: Payer: Self-pay | Admitting: *Deleted

## 2017-07-17 ENCOUNTER — Encounter: Payer: Self-pay | Admitting: Obstetrics & Gynecology

## 2017-07-17 ENCOUNTER — Other Ambulatory Visit: Payer: Self-pay | Admitting: Obstetrics & Gynecology

## 2017-07-17 MED ORDER — DOXYCYCLINE HYCLATE 100 MG PO TABS: 100.0000 mg | ORAL_TABLET | Freq: Two times a day (BID) | ORAL | 0 refills | Status: DC

## 2017-07-17 NOTE — Telephone Encounter (Signed)
Pharmacy notified to change gabapentin to capsules per Dr Despina HiddenEure to avoid PA.

## 2017-07-24 ENCOUNTER — Encounter: Payer: Self-pay | Admitting: Obstetrics & Gynecology

## 2017-07-25 ENCOUNTER — Ambulatory Visit: Payer: Medicaid Other | Admitting: Obstetrics & Gynecology

## 2017-07-25 ENCOUNTER — Encounter: Payer: Self-pay | Admitting: Obstetrics & Gynecology

## 2017-07-25 VITALS — BP 162/82 | HR 67 | Ht 62.0 in | Wt 138.5 lb

## 2017-07-25 DIAGNOSIS — L0501 Pilonidal cyst with abscess: Secondary | ICD-10-CM

## 2017-07-25 NOTE — Progress Notes (Signed)
Chief Complaint  Patient presents with  . Diabetic Ulcer    "just don't feel right"      36 y.o. G2P1011 Patient's last menstrual period was 06/12/2016. The current method of family planning is status post hysterectomy.  Outpatient Encounter Medications as of 07/25/2017  Medication Sig  . ALPRAZolam (XANAX) 1 MG tablet Take 1 tablet (1 mg total) by mouth 3 (three) times daily as needed for anxiety.  . butalbital-acetaminophen-caffeine (FIORICET, ESGIC) 50-325-40 MG tablet Take 1-2 tablets by mouth every 6 (six) hours as needed for headache.  . doxycycline (VIBRA-TABS) 100 MG tablet Take 1 tablet (100 mg total) by mouth 2 (two) times daily.  Marland Kitchen. escitalopram (LEXAPRO) 20 MG tablet TAKE ONE TABLET BY MOUTH ONCE DAILY  . gabapentin (NEURONTIN) 600 MG tablet Take 1 tablet (600 mg total) by mouth 3 (three) times daily.  . insulin aspart (NOVOLOG) 100 UNIT/ML injection Inject 10-16 Units into the skin 3 (three) times daily with meals.  . insulin glargine (LANTUS) 100 UNIT/ML injection Inject 0.3 mLs (30 Units total) into the skin at bedtime.  . silver sulfADIAZINE (SILVADENE) 1 % cream Apply 3-4 times daily (Patient taking differently: Prn)  . valACYclovir (VALTREX) 1000 MG tablet TAKE ONE TABLET BY MOUTH ONCE DAILY  . zolpidem (AMBIEN) 10 MG tablet Take 10 mg at bedtime as needed by mouth for sleep.  . [DISCONTINUED] doxycycline (VIBRA-TABS) 100 MG tablet Take 1 tablet (100 mg total) by mouth 2 (two) times daily.  Marland Kitchen. zolpidem (AMBIEN) 10 MG tablet Take 1 tablet (10 mg total) by mouth at bedtime as needed for sleep.  . [DISCONTINUED] estradiol (ESTRACE) 1 MG tablet Take 1 tablet (1 mg total) by mouth daily.  . [DISCONTINUED] estradiol (VIVELLE-DOT) 0.1 MG/24HR patch Place 1 patch (0.1 mg total) onto the skin 2 (two) times a week.  . [DISCONTINUED] LORazepam (ATIVAN) 0.5 MG tablet TAKE ONE TABLET BY MOUTH AS NEEDED FOR ANXIETY   No facility-administered encounter medications on file as of  07/25/2017.     Subjective Domingo MendVictoria N Wrobel comes to the office after treated her via my chart for which she described as an ulcer on her bottom which I assume was probably an infected boil and placed her own doxycycline She recontacted me and said that it was much better but she felt like it was not quite healed and as a result I told her to come and let me take a look She of course is diabetic patient states that area started about 10 days ago with redness tenderness and its in her gluteal fold actually, she says it has drained and she has done tub soaks and taking the antibiotic all of which has improved However there is still some tenderness and she wanted to make sure that it was responding appropriately Past Medical History:  Diagnosis Date  . Anemia   . Anxiety   . Constipation   . Diabetes mellitus without complication (HCC)   . Family history of malignant neoplasm of gastrointestinal tract   . Gastroparesis   . IBS (irritable bowel syndrome)    constipation  . Migraines   . Neuropathy due to type 2 diabetes mellitus [facility](HCC)     Past Surgical History:  Procedure Laterality Date  . CESAREAN SECTION    . CHOLECYSTECTOMY  2007  . EYE SURGERY  1989  . TONSILLECTOMY  1992    OB History    Gravida Para Term Preterm AB Living   2 1 1  1 1   SAB TAB Ectopic Multiple Live Births   1       1      Allergies  Allergen Reactions  . Celebrex [Celecoxib]   . Latex   . Sulfonamide Derivatives     Social History   Socioeconomic History  . Marital status: Single    Spouse name: None  . Number of children: None  . Years of education: None  . Highest education level: None  Social Needs  . Financial resource strain: None  . Food insecurity - worry: None  . Food insecurity - inability: None  . Transportation needs - medical: None  . Transportation needs - non-medical: None  Occupational History  . None  Tobacco Use  . Smoking status: Former Smoker    Years: 10.00     Types: Cigarettes    Last attempt to quit: 06/24/2014    Years since quitting: 3.0  . Smokeless tobacco: Never Used  Substance and Sexual Activity  . Alcohol use: Yes    Comment: once in a while  . Drug use: No  . Sexual activity: Not Currently    Birth control/protection: Surgical    Comment: hyst  Other Topics Concern  . None  Social History Narrative  . None    Family History  Problem Relation Age of Onset  . Other Paternal Grandfather        fell down stairs  . Other Paternal Grandmother        fell down stairs  . Alzheimer's disease Maternal Grandmother   . Dementia Maternal Grandmother   . Colon cancer Maternal Grandfather   . Other Mother        back pain; nerve damage  . Hypertension Mother   . Hemophilia Son     Medications:       Current Outpatient Medications:  .  ALPRAZolam (XANAX) 1 MG tablet, Take 1 tablet (1 mg total) by mouth 3 (three) times daily as needed for anxiety., Disp: 60 tablet, Rfl: 3 .  butalbital-acetaminophen-caffeine (FIORICET, ESGIC) 50-325-40 MG tablet, Take 1-2 tablets by mouth every 6 (six) hours as needed for headache., Disp: 20 tablet, Rfl: 2 .  doxycycline (VIBRA-TABS) 100 MG tablet, Take 1 tablet (100 mg total) by mouth 2 (two) times daily., Disp: 20 tablet, Rfl: 0 .  escitalopram (LEXAPRO) 20 MG tablet, TAKE ONE TABLET BY MOUTH ONCE DAILY, Disp: 30 tablet, Rfl: 11 .  gabapentin (NEURONTIN) 600 MG tablet, Take 1 tablet (600 mg total) by mouth 3 (three) times daily., Disp: 90 tablet, Rfl: 11 .  insulin aspart (NOVOLOG) 100 UNIT/ML injection, Inject 10-16 Units into the skin 3 (three) times daily with meals., Disp: 10 mL, Rfl: 12 .  insulin glargine (LANTUS) 100 UNIT/ML injection, Inject 0.3 mLs (30 Units total) into the skin at bedtime., Disp: 10 mL, Rfl: 12 .  silver sulfADIAZINE (SILVADENE) 1 % cream, Apply 3-4 times daily (Patient taking differently: Prn), Disp: 50 g, Rfl: 11 .  valACYclovir (VALTREX) 1000 MG tablet, TAKE ONE TABLET BY  MOUTH ONCE DAILY, Disp: 30 tablet, Rfl: 6 .  zolpidem (AMBIEN) 10 MG tablet, Take 10 mg at bedtime as needed by mouth for sleep., Disp: , Rfl:  .  zolpidem (AMBIEN) 10 MG tablet, Take 1 tablet (10 mg total) by mouth at bedtime as needed for sleep., Disp: 30 tablet, Rfl: 3  Objective Blood pressure (!) 162/82, pulse 67, height 5\' 2"  (1.575 m), weight 138 lb 8 oz (62.8 kg), last menstrual  period 06/12/2016.  General well-developed well-nourished female no acute distress The area involved is below her gluteal fold and I think it probably is a pilonidal cyst which has become infected I guess I would call in an abscess although nail the skin is peeling and is very minimal at this point It is still somewhat tender but again and physical exam it is much improved over what she describes pre-doxycycline Gentian violet he is placed   Pertinent ROS No burning with urination, frequency or urgency No nausea, vomiting or diarrhea Nor fever chills or other constitutional symptoms   Labs or studies none    Impression Diagnoses this Encounter::   ICD-10-CM   1. Pilonidal abscess L05.01     Established relevant diagnosis(es): Diabetes under variable control currently not optimal  Plan/Recommendations: Meds ordered this encounter  Medications  . zolpidem (AMBIEN) 10 MG tablet    Sig: Take 10 mg at bedtime as needed by mouth for sleep.    Labs or Scans Ordered: No orders of the defined types were placed in this encounter.   Management:: I do not see a need at this point to continue treatment with antibiotic I treated topically with gentian violet And have told the patient that if she does not improve or if it worsens that I will refer her to Dr. Henreitta LeberBridges general surgeon for further evaluation and treatment Although was found in the classic location for a pilonidal cyst and abscess it is most consistent with that as far as its location and presentation Patient will send me a my chart  message and let me know if it flares back up or she worsens  Follow up Return if symptoms worsen or fail to improve.   All questions were answered.

## 2017-07-26 ENCOUNTER — Encounter: Payer: Self-pay | Admitting: Obstetrics & Gynecology

## 2017-08-14 ENCOUNTER — Other Ambulatory Visit: Payer: Self-pay | Admitting: Advanced Practice Midwife

## 2017-08-21 ENCOUNTER — Encounter: Payer: Self-pay | Admitting: *Deleted

## 2017-08-21 ENCOUNTER — Encounter: Payer: Self-pay | Admitting: Obstetrics & Gynecology

## 2017-08-21 ENCOUNTER — Other Ambulatory Visit: Payer: Self-pay | Admitting: *Deleted

## 2017-08-21 ENCOUNTER — Other Ambulatory Visit: Payer: Self-pay | Admitting: Obstetrics & Gynecology

## 2017-08-21 DIAGNOSIS — L0591 Pilonidal cyst without abscess: Secondary | ICD-10-CM

## 2017-08-22 DIAGNOSIS — G4700 Insomnia, unspecified: Secondary | ICD-10-CM | POA: Diagnosis not present

## 2017-08-22 DIAGNOSIS — F3341 Major depressive disorder, recurrent, in partial remission: Secondary | ICD-10-CM | POA: Diagnosis not present

## 2017-08-22 DIAGNOSIS — F419 Anxiety disorder, unspecified: Secondary | ICD-10-CM | POA: Diagnosis not present

## 2017-08-22 DIAGNOSIS — E1042 Type 1 diabetes mellitus with diabetic polyneuropathy: Secondary | ICD-10-CM | POA: Diagnosis not present

## 2017-08-22 DIAGNOSIS — Z23 Encounter for immunization: Secondary | ICD-10-CM | POA: Diagnosis not present

## 2017-08-29 ENCOUNTER — Ambulatory Visit: Payer: Medicaid Other | Admitting: General Surgery

## 2017-08-30 DIAGNOSIS — E1065 Type 1 diabetes mellitus with hyperglycemia: Secondary | ICD-10-CM | POA: Diagnosis not present

## 2017-08-30 DIAGNOSIS — E1042 Type 1 diabetes mellitus with diabetic polyneuropathy: Secondary | ICD-10-CM | POA: Diagnosis not present

## 2017-11-21 ENCOUNTER — Other Ambulatory Visit: Payer: Self-pay | Admitting: Obstetrics & Gynecology

## 2018-01-02 ENCOUNTER — Other Ambulatory Visit: Payer: Self-pay | Admitting: Obstetrics & Gynecology

## 2018-01-04 ENCOUNTER — Other Ambulatory Visit: Payer: Self-pay | Admitting: Obstetrics & Gynecology

## 2018-01-09 ENCOUNTER — Other Ambulatory Visit: Payer: Self-pay | Admitting: Obstetrics & Gynecology

## 2018-01-09 ENCOUNTER — Other Ambulatory Visit: Payer: Self-pay | Admitting: *Deleted

## 2018-01-09 MED ORDER — ZOLPIDEM TARTRATE 10 MG PO TABS: 10.0000 mg | ORAL_TABLET | Freq: Every evening | ORAL | 3 refills | Status: DC | PRN

## 2018-01-18 ENCOUNTER — Encounter (INDEPENDENT_AMBULATORY_CARE_PROVIDER_SITE_OTHER): Payer: Self-pay | Admitting: Internal Medicine

## 2018-01-18 ENCOUNTER — Ambulatory Visit (INDEPENDENT_AMBULATORY_CARE_PROVIDER_SITE_OTHER): Payer: Medicaid Other | Admitting: Internal Medicine

## 2018-01-18 VITALS — BP 116/80 | HR 72 | Temp 98.6°F | Ht 62.0 in | Wt 135.6 lb

## 2018-01-18 DIAGNOSIS — R748 Abnormal levels of other serum enzymes: Secondary | ICD-10-CM | POA: Diagnosis not present

## 2018-01-18 DIAGNOSIS — R1013 Epigastric pain: Secondary | ICD-10-CM

## 2018-01-18 LAB — HEPATIC FUNCTION PANEL
AG Ratio: 1.6 (calc) (ref 1.0–2.5)
ALBUMIN MSPROF: 3.9 g/dL (ref 3.6–5.1)
ALT: 281 U/L — ABNORMAL HIGH (ref 6–29)
AST: 93 U/L — ABNORMAL HIGH (ref 10–30)
Alkaline phosphatase (APISO): 253 U/L — ABNORMAL HIGH (ref 33–115)
BILIRUBIN DIRECT: 0.2 mg/dL (ref 0.0–0.2)
BILIRUBIN INDIRECT: 0.4 mg/dL (ref 0.2–1.2)
GLOBULIN: 2.4 g/dL (ref 1.9–3.7)
Total Bilirubin: 0.6 mg/dL (ref 0.2–1.2)
Total Protein: 6.3 g/dL (ref 6.1–8.1)

## 2018-01-18 LAB — CBC WITH DIFFERENTIAL/PLATELET
Basophils Absolute: 58 cells/uL (ref 0–200)
Basophils Relative: 1 %
EOS ABS: 174 {cells}/uL (ref 15–500)
Eosinophils Relative: 3 %
HCT: 35.6 % (ref 35.0–45.0)
Hemoglobin: 12.3 g/dL (ref 11.7–15.5)
LYMPHS ABS: 2169 {cells}/uL (ref 850–3900)
MCH: 29.9 pg (ref 27.0–33.0)
MCHC: 34.6 g/dL (ref 32.0–36.0)
MCV: 86.6 fL (ref 80.0–100.0)
MPV: 11.5 fL (ref 7.5–12.5)
Monocytes Relative: 8.2 %
NEUTROS PCT: 50.4 %
Neutro Abs: 2923 cells/uL (ref 1500–7800)
PLATELETS: 334 10*3/uL (ref 140–400)
RBC: 4.11 10*6/uL (ref 3.80–5.10)
RDW: 12.6 % (ref 11.0–15.0)
TOTAL LYMPHOCYTE: 37.4 %
WBC: 5.8 10*3/uL (ref 3.8–10.8)
WBCMIX: 476 {cells}/uL (ref 200–950)

## 2018-01-18 NOTE — Progress Notes (Addendum)
Subjective:    Patient ID: [name], female    DOB: 08/12/1981, 37 y.o.   MRN: [id number]  HPI Referred by Dr. Paulina Fusi for abdominal pain.  For the last 8-10 weeks she has epigastric pain. She says she has diarrhea for 2-3 days. She says when she has a BM it looks like coffee grounds.  Her  H pylori was negative. 4/16/219 Acute hepatitis Panel was normal.   01/04/2018 US abdomen: abdominal pain, nausea ,diarrhea: Liver : No focal lesion identified. Within normal limits in parenchymal echogenicity. Portal vein is patent on color doppler imaging with normal direction of blood flow towards the liver.   No NSAIDs. No BC powders.  01/02/2018 ALP 187, AST 63, ALT 78.  Her appetite is okay. Somedays she says she cannot eat. She has a BM at least every other day.   Family hx of colon cancer in a grandfather in his 53s.   Hx of cholecystectomy          Hx of anxiety, depression, diabetic neuropathy.   Underwent a colonoscopy by Dr. Jarold Motto in 2011 for rectal bleeding     ENDOSCOPIC IMPRESSION:     1) No polyps or cancers     2) No active bleeding or blood in c     3) Otherwise normal examination Review of Systems Past Medical History:  Diagnosis Date  . Anemia   . Anxiety   . Constipation   . Diabetes mellitus without complication (HCC)   . Family history of malignant neoplasm of gastrointestinal tract   . Gastroparesis   . IBS (irritable bowel syndrome)    constipation  . Migraines   . Neuropathy due to type 2 diabetes mellitus [facility])     Past Surgical History:  Procedure Laterality Date  . ABDOMINAL HYSTERECTOMY N/A 06/29/2016   Procedure: TOTAL ABDOMINAL HYSTERECTOMY;  Surgeon: Lazaro Arms, MD;  Location: [facility];  Service: Gynecology;  Laterality: N/A;  . CESAREAN SECTION    . CHOLECYSTECTOMY  2007  . EYE SURGERY  1989  . SALPINGOOPHORECTOMY Bilateral 06/29/2016   Procedure: BILATERAL SALPINGO OOPHORECTOMY;  Surgeon: Lazaro Arms, MD;  Location: [facility];   Service: Gynecology;  Laterality: Bilateral;  . TONSILLECTOMY  1992    Allergies  Allergen Reactions  . Celebrex [Celecoxib]   . Latex   . Sulfonamide Derivatives     Current Outpatient Medications on File Prior to Visit  Medication Sig Dispense Refill  . ALPRAZolam (XANAX) 1 MG tablet TAKE 1 TABLET BY MOUTH THREE TIMES DAILY AS NEEDED FOR ANXIETY (Patient taking differently: daily as needed) 60 tablet 5  . butalbital-acetaminophen-caffeine (FIORICET, ESGIC) 50-325-40 MG tablet TAKE 1 TO 2 TABLETS BY MOUTH EVERY 6 HOURS AS NEEDED FOR HEADACHE 20 tablet 2  . escitalopram (LEXAPRO) 20 MG tablet TAKE 1 TABLET BY MOUTH ONCE DAILY 30 tablet 11  . gabapentin (NEURONTIN) 300 MG capsule TAKE 2 CAPSULES BY MOUTH THREE TIMES DAILY 90 capsule 6  . Liraglutide (VICTOZA [place]) Inject into the skin.    . pantoprazole (PROTONIX) 40 MG tablet Take 40 mg by mouth daily.    . silver sulfADIAZINE (SILVADENE) 1 % cream Apply 3-4 times daily (Patient taking differently: Prn) 50 g 11  . valACYclovir (VALTREX) 1000 MG tablet TAKE 1 TABLET BY MOUTH ONCE DAILY 30 tablet 6  . zolpidem (AMBIEN) 10 MG tablet Take 1 tablet (10 mg total) by mouth at bedtime as needed for up to 14 days for sleep. 30 tablet  3  . insulin glargine (LANTUS) 100 UNIT/ML injection Inject 0.3 mLs (30 Units total) into the skin at bedtime. (Patient taking differently: Inject 25 Units into the skin at bedtime. ) 10 mL 12   No current facility-administered medications on file prior to visit.         Objective:   Physical Exam Blood pressure 116/80, pulse 72, temperature 98.6 F (37 C), height  (1.575 m), weight 135 lb 9.6 oz (61.5 kg), last menstrual period 06/12/2016. Alert and oriented. Skin warm and dry. Oral mucosa is moist.   . Sclera anicteric, conjunctivae is pink. Thyroid not enlarged. No cervical lymphadenopathy. Lungs clear. Heart regular rate and rhythm.  Abdomen is soft. Bowel sounds are positive. No hepatomegaly. No abdominal  masses felt. No tenderness.  No edema to lower extremities.  Rectal exam: no masses. Guaiac negative.          Assessment & Plan:  Elevated liver enzymes: Am going to repeat today. Epigastric pain: Increase the Protonix to BID.before breakfast and supper.  OV in 8 weeks.

## 2018-01-18 NOTE — Patient Instructions (Addendum)
Protonix increased to BID. Hepatic and CBC OV in 2 months.  Will decide at next OV if u need an EGD

## 2018-01-22 ENCOUNTER — Other Ambulatory Visit (INDEPENDENT_AMBULATORY_CARE_PROVIDER_SITE_OTHER): Payer: Self-pay | Admitting: Internal Medicine

## 2018-01-22 ENCOUNTER — Telehealth (INDEPENDENT_AMBULATORY_CARE_PROVIDER_SITE_OTHER): Payer: Self-pay | Admitting: Internal Medicine

## 2018-01-22 DIAGNOSIS — R748 Abnormal levels of other serum enzymes: Secondary | ICD-10-CM

## 2018-01-22 NOTE — Telephone Encounter (Signed)
Labs ordered to rule out auto immune process.

## 2018-02-19 ENCOUNTER — Other Ambulatory Visit (INDEPENDENT_AMBULATORY_CARE_PROVIDER_SITE_OTHER): Payer: Self-pay | Admitting: *Deleted

## 2018-02-19 ENCOUNTER — Encounter (INDEPENDENT_AMBULATORY_CARE_PROVIDER_SITE_OTHER): Payer: Self-pay | Admitting: *Deleted

## 2018-02-19 DIAGNOSIS — R748 Abnormal levels of other serum enzymes: Secondary | ICD-10-CM

## 2018-03-20 ENCOUNTER — Encounter (INDEPENDENT_AMBULATORY_CARE_PROVIDER_SITE_OTHER): Payer: Self-pay | Admitting: Internal Medicine

## 2018-03-20 ENCOUNTER — Ambulatory Visit (INDEPENDENT_AMBULATORY_CARE_PROVIDER_SITE_OTHER): Payer: Medicaid Other | Admitting: Internal Medicine

## 2018-04-25 DIAGNOSIS — Z23 Encounter for immunization: Secondary | ICD-10-CM | POA: Diagnosis not present

## 2018-04-25 DIAGNOSIS — L03115 Cellulitis of right lower limb: Secondary | ICD-10-CM | POA: Diagnosis not present

## 2018-04-25 DIAGNOSIS — S91331A Puncture wound without foreign body, right foot, initial encounter: Secondary | ICD-10-CM | POA: Diagnosis not present

## 2018-05-27 ENCOUNTER — Other Ambulatory Visit: Payer: Self-pay | Admitting: Advanced Practice Midwife

## 2018-08-05 ENCOUNTER — Other Ambulatory Visit: Payer: Self-pay | Admitting: Obstetrics & Gynecology

## 2018-09-27 ENCOUNTER — Other Ambulatory Visit: Payer: Self-pay | Admitting: Obstetrics & Gynecology

## 2018-10-23 ENCOUNTER — Other Ambulatory Visit: Payer: Self-pay

## 2018-10-23 ENCOUNTER — Ambulatory Visit: Payer: Medicaid Other | Admitting: Obstetrics & Gynecology

## 2018-10-23 ENCOUNTER — Encounter: Payer: Self-pay | Admitting: Obstetrics & Gynecology

## 2018-10-23 VITALS — BP 130/87 | HR 81 | Ht 62.0 in | Wt 137.0 lb

## 2018-10-23 DIAGNOSIS — Z01419 Encounter for gynecological examination (general) (routine) without abnormal findings: Secondary | ICD-10-CM

## 2018-10-23 DIAGNOSIS — Z0000 Encounter for general adult medical examination without abnormal findings: Secondary | ICD-10-CM | POA: Diagnosis not present

## 2018-10-23 NOTE — Progress Notes (Signed)
Subjective:     [name] is a 38 y.o. female here for a routine exam.  Patient's last menstrual period was 06/12/2016. [id number] Birth Control Method:  hysterectomy Menstrual Calendar(currently): amenorrhea  Current complaints: none.   Current acute medical issues:  Diabetes under great control   Recent Gynecologic History Patient's last menstrual period was 06/12/2016. Last Pap: 2016,  normal Last mammogram: never,  Start at age 55  Past Medical History:  Diagnosis Date  . Anemia   . Anxiety   . Constipation   . Diabetes mellitus without complication (HCC)   . Family history of malignant neoplasm of gastrointestinal tract   . Gastroparesis   . IBS (irritable bowel syndrome)    constipation  . Migraines   . Neuropathy due to type 2 diabetes mellitus [facility])     Past Surgical History:  Procedure Laterality Date  . ABDOMINAL HYSTERECTOMY N/A 06/29/2016   Procedure: TOTAL ABDOMINAL HYSTERECTOMY;  Surgeon: Lazaro Arms, MD;  Location: [facility];  Service: Gynecology;  Laterality: N/A;  . CESAREAN SECTION    . CHOLECYSTECTOMY  2007  . EYE SURGERY  1989  . SALPINGOOPHORECTOMY Bilateral 06/29/2016   Procedure: BILATERAL SALPINGO OOPHORECTOMY;  Surgeon: Lazaro Arms, MD;  Location: [facility];  Service: Gynecology;  Laterality: Bilateral;  . TONSILLECTOMY  1992    OB History    Gravida  2   Para  1   Term  1   Preterm      AB  1   Living  1     SAB  1   TAB      Ectopic      Multiple      Live Births  1           Social History   Socioeconomic History  . Marital status: Single    Spouse name: Not on file  . Number of children: Not on file  . Years of education: Not on file  . Highest education level: Not on file  Occupational History  . Not on file  Social Needs  . Financial resource strain: Not on file  . Food insecurity:    Worry: Not on file    Inability: Not on file  . Transportation needs:    Medical: Not on file    Non-medical: Not on  file  Tobacco Use  . Smoking status: Former Smoker    Years: 10.00    Types: Cigarettes    Last attempt to quit: 06/24/2014    Years since quitting: 4.3  . Smokeless tobacco: Never Used  Substance and Sexual Activity  . Alcohol use: Yes    Comment: once in a while  . Drug use: No  . Sexual activity: Not Currently    Birth control/protection: Surgical    Comment: hyst  Lifestyle  . Physical activity:    Days per week: Not on file    Minutes per session: Not on file  . Stress: Not on file  Relationships  . Social connections:    Talks on phone: Not on file    Gets together: Not on file    Attends religious service: Not on file    Active member of club or organization: Not on file    Attends meetings of clubs or organizations: Not on file    Relationship status: Not on file  Other Topics Concern  . Not on file  Social History Narrative  . Not on file    Family  History  Problem Relation Age of Onset  . Other Paternal Grandfather        fell down stairs  . Other Paternal Grandmother        fell down stairs  . Alzheimer's disease Maternal Grandmother   . Dementia Maternal Grandmother   . Colon cancer Maternal Grandfather   . Other Mother        back pain; nerve damage  . Hypertension Mother   . Hemophilia Son      Current Outpatient Medications:  .  ALPRAZolam (XANAX) 1 MG tablet, daily as needed, Disp: 60 tablet, Rfl: 5 .  butalbital-acetaminophen-caffeine (FIORICET, ESGIC) 50-325-40 MG tablet, TAKE 1 TO 2 TABLETS BY MOUTH EVERY 6 HOURS AS NEEDED FOR HEADACHE, Disp: 20 tablet, Rfl: 2 .  escitalopram (LEXAPRO) 20 MG tablet, TAKE 1 TABLET BY MOUTH ONCE DAILY, Disp: 30 tablet, Rfl: 11 .  gabapentin (NEURONTIN) 300 MG capsule, TAKE ONE CAPSULE BY MOUTH THREE TIMES DAILY, Disp: 90 capsule, Rfl: 3 .  insulin glargine (LANTUS) 100 UNIT/ML injection, Inject 0.3 mLs (30 Units total) into the skin at bedtime. (Patient taking differently: Inject 25 Units into the skin at  bedtime. ), Disp: 10 mL, Rfl: 12 .  Liraglutide (VICTOZA [place]), Inject into the skin., Disp: , Rfl:  .  pantoprazole (PROTONIX) 40 MG tablet, Take 40 mg by mouth daily., Disp: , Rfl:  .  silver sulfADIAZINE (SILVADENE) 1 % cream, Apply 3-4 times daily (Patient taking differently: Prn), Disp: 50 g, Rfl: 11 .  valACYclovir (VALTREX) 1000 MG tablet, TAKE 1 TABLET BY MOUTH ONCE DAILY, Disp: 30 tablet, Rfl: 6 .  zolpidem (AMBIEN) 10 MG tablet, Take 1 tablet (10 mg total) by mouth at bedtime as needed for up to 14 days for sleep., Disp: 30 tablet, Rfl: 3  Review of Systems  Review of Systems  Constitutional: Negative for fever, chills, weight loss, malaise/fatigue and diaphoresis.  HENT: Negative for hearing loss, ear pain, nosebleeds, congestion, sore throat, neck pain, tinnitus and ear discharge.   Eyes: Negative for blurred vision, double vision, photophobia, pain, discharge and redness.  Respiratory: Negative for cough, hemoptysis, sputum production, shortness of breath, wheezing and stridor.   Cardiovascular: Negative for chest pain, palpitations, orthopnea, claudication, leg swelling and PND.  Gastrointestinal: negative for abdominal pain. Negative for heartburn, nausea, vomiting, diarrhea, constipation, blood in stool and melena.  Genitourinary: Negative for dysuria, urgency, frequency, hematuria and flank pain.  Musculoskeletal: Negative for myalgias, back pain, joint pain and falls.  Skin: Negative for itching and rash.  Neurological: Negative for dizziness, tingling, tremors, sensory change, speech change, focal weakness, seizures, loss of consciousness, weakness and headaches.  Endo/Heme/Allergies: Negative for environmental allergies and polydipsia. Does not bruise/bleed easily.  Psychiatric/Behavioral: Negative for depression, suicidal ideas, hallucinations, memory loss and substance abuse. The patient is not nervous/anxious and does not have insomnia.        Objective:  Blood pressure  130/87, pulse 81, height 5\' 2"  (1.575 m), weight 137 lb (62.1 kg), last menstrual period 06/12/2016.   Physical Exam  Vitals reviewed. Constitutional: She is oriented to person, place, and time. She appears well-developed and well-nourished.  HENT:  Head: Normocephalic and atraumatic.        Right Ear: External ear normal.  Left Ear: External ear normal.  Nose: Nose normal.  Mouth/Throat: Oropharynx is clear and moist.  Eyes: Conjunctivae and EOM are normal. Pupils are equal, round, and reactive to light. Right eye exhibits no discharge. Left eye exhibits no  discharge. No scleral icterus.  Neck: Normal range of motion. Neck supple. No tracheal deviation present. No thyromegaly present.  Cardiovascular: Normal rate, regular rhythm, normal heart sounds and intact distal pulses.  Exam reveals no gallop and no friction rub.   No murmur heard. Respiratory: Effort normal and breath sounds normal. No respiratory distress. She has no wheezes. She has no rales. She exhibits no tenderness.  GI: Soft. Bowel sounds are normal. She exhibits no distension and no mass. There is no tenderness. There is no rebound and no guarding.  Genitourinary:  Breasts no masses skin changes or nipple changes bilaterally      Vulva is normal without lesions Vagina is pink moist without discharge Cervix absent Uterus is absent Adnexa is negative   Musculoskeletal: Normal range of motion. She exhibits no edema and no tenderness.  Neurological: She is alert and oriented to person, place, and time. She has normal reflexes. She displays normal reflexes. No cranial nerve deficit. She exhibits normal muscle tone. Coordination normal.  Skin: Skin is warm and dry. No rash noted. No erythema. No pallor.  Psychiatric: She has a normal mood and affect. Her behavior is normal. Judgment and thought content normal.       Medications Ordered at today's visit: No orders of the defined types were placed in this encounter.   Other  orders placed at today's visit: No orders of the defined types were placed in this encounter.     Assessment:    Healthy female exam.    Plan:    Contraception: status post hysterectomy. Mammogram ordered. Follow up in: 3 years.    Does not want ERT Return in about 3 years (around 10/23/2021) for yearly.

## 2018-11-05 DIAGNOSIS — G629 Polyneuropathy, unspecified: Secondary | ICD-10-CM | POA: Diagnosis not present

## 2018-11-20 DIAGNOSIS — R202 Paresthesia of skin: Secondary | ICD-10-CM | POA: Diagnosis not present

## 2018-11-20 DIAGNOSIS — H5713 Ocular pain, bilateral: Secondary | ICD-10-CM | POA: Diagnosis not present

## 2018-12-09 ENCOUNTER — Other Ambulatory Visit: Payer: Self-pay | Admitting: Obstetrics & Gynecology

## 2019-01-29 ENCOUNTER — Other Ambulatory Visit: Payer: Self-pay | Admitting: Obstetrics & Gynecology

## 2019-02-06 ENCOUNTER — Telehealth: Payer: Self-pay | Admitting: *Deleted

## 2019-02-06 NOTE — Telephone Encounter (Signed)
Pt requesting refill on Fioricet. Please advise. Thanks!! JSY

## 2019-02-07 ENCOUNTER — Other Ambulatory Visit: Payer: Self-pay | Admitting: Obstetrics & Gynecology

## 2019-02-07 MED ORDER — BUTALBITAL-APAP-CAFFEINE 50-325-40 MG PO TABS: ORAL_TABLET | ORAL | 0 refills | Status: DC

## 2019-02-07 NOTE — Telephone Encounter (Signed)
fioricet has been e prescribed

## 2019-02-12 ENCOUNTER — Other Ambulatory Visit: Payer: Self-pay | Admitting: Obstetrics & Gynecology

## 2019-02-12 ENCOUNTER — Telehealth: Payer: Self-pay | Admitting: Obstetrics & Gynecology

## 2019-02-12 NOTE — Telephone Encounter (Signed)
Patient called stating that she would like a refill of her Xanax, pt states that she called her pharmacy but last time they never sent the refill in for her. Please contact pt

## 2019-02-13 ENCOUNTER — Other Ambulatory Visit: Payer: Self-pay | Admitting: Obstetrics & Gynecology

## 2019-02-13 MED ORDER — ALPRAZOLAM 1 MG PO TABS: ORAL_TABLET | ORAL | 5 refills | Status: DC

## 2019-03-17 ENCOUNTER — Other Ambulatory Visit: Payer: Self-pay | Admitting: Advanced Practice Midwife

## 2019-03-17 ENCOUNTER — Other Ambulatory Visit: Payer: Self-pay | Admitting: Obstetrics & Gynecology

## 2019-04-16 ENCOUNTER — Other Ambulatory Visit: Payer: Self-pay | Admitting: Obstetrics & Gynecology

## 2019-04-26 ENCOUNTER — Other Ambulatory Visit: Payer: Self-pay | Admitting: *Deleted

## 2019-04-26 MED ORDER — PANTOPRAZOLE SODIUM 40 MG PO TBEC: 40.0000 mg | DELAYED_RELEASE_TABLET | Freq: Every day | ORAL | 11 refills | Status: DC

## 2019-05-13 ENCOUNTER — Telehealth: Payer: Self-pay | Admitting: Obstetrics & Gynecology

## 2019-05-13 NOTE — Telephone Encounter (Signed)
Pt states she has tried taking AZO for burning while urinating and it is not helping. Pt requesting medication to be sent in.

## 2019-05-14 ENCOUNTER — Other Ambulatory Visit: Payer: Medicaid Other

## 2019-05-14 ENCOUNTER — Other Ambulatory Visit: Payer: Self-pay

## 2019-05-14 DIAGNOSIS — R300 Dysuria: Secondary | ICD-10-CM | POA: Diagnosis not present

## 2019-05-14 NOTE — Progress Notes (Signed)
Pt complaining of burning with urination. I advised would send urine to lab for culture. Advised to continue AZO for now.  Pt voiced understanding. [place]

## 2019-05-16 ENCOUNTER — Telehealth: Payer: Self-pay | Admitting: Obstetrics & Gynecology

## 2019-05-16 LAB — URINE CULTURE

## 2019-05-16 NOTE — Telephone Encounter (Signed)
Called patient back and left message informing her that she doesn't have a uti.

## 2019-05-16 NOTE — Telephone Encounter (Signed)
Patient called, would like to know her lab results.  [facility] [place]  [phone]

## 2019-06-11 DIAGNOSIS — Z794 Long term (current) use of insulin: Secondary | ICD-10-CM | POA: Diagnosis not present

## 2019-06-11 DIAGNOSIS — E1140 Type 2 diabetes mellitus with diabetic neuropathy, unspecified: Secondary | ICD-10-CM | POA: Diagnosis not present

## 2019-06-11 DIAGNOSIS — E1165 Type 2 diabetes mellitus with hyperglycemia: Secondary | ICD-10-CM | POA: Diagnosis not present

## 2019-06-19 DIAGNOSIS — E1165 Type 2 diabetes mellitus with hyperglycemia: Secondary | ICD-10-CM | POA: Diagnosis not present

## 2019-06-19 DIAGNOSIS — E1140 Type 2 diabetes mellitus with diabetic neuropathy, unspecified: Secondary | ICD-10-CM | POA: Diagnosis not present

## 2019-06-19 DIAGNOSIS — Z794 Long term (current) use of insulin: Secondary | ICD-10-CM | POA: Diagnosis not present

## 2019-07-12 ENCOUNTER — Other Ambulatory Visit: Payer: Self-pay | Admitting: Obstetrics & Gynecology

## 2019-08-01 ENCOUNTER — Ambulatory Visit: Payer: Medicaid Other | Admitting: Family Medicine

## 2019-08-02 ENCOUNTER — Encounter: Payer: Self-pay | Admitting: General Practice

## 2019-08-14 ENCOUNTER — Other Ambulatory Visit: Payer: Self-pay | Admitting: Advanced Practice Midwife

## 2019-08-19 NOTE — Telephone Encounter (Signed)
This is Dr Brynda Greathouse patient (I must have refilled this when he wasn't here).  I don't see where she has HSV anywhere in his notes or in her hx. Can you send this to him to review?

## 2019-08-21 DIAGNOSIS — R202 Paresthesia of skin: Secondary | ICD-10-CM | POA: Diagnosis not present

## 2019-08-21 DIAGNOSIS — H5713 Ocular pain, bilateral: Secondary | ICD-10-CM | POA: Diagnosis not present

## 2019-08-29 DIAGNOSIS — G629 Polyneuropathy, unspecified: Secondary | ICD-10-CM | POA: Diagnosis not present

## 2019-08-29 DIAGNOSIS — R202 Paresthesia of skin: Secondary | ICD-10-CM | POA: Diagnosis not present

## 2019-10-01 ENCOUNTER — Telehealth: Payer: Self-pay | Admitting: Obstetrics & Gynecology

## 2019-10-01 MED ORDER — SILVER SULFADIAZINE 1 % EX CREA: TOPICAL_CREAM | CUTANEOUS | 11 refills | Status: DC

## 2019-10-01 MED ORDER — DOXYCYCLINE HYCLATE 100 MG PO TABS: 100.0000 mg | ORAL_TABLET | Freq: Two times a day (BID) | ORAL | 0 refills | Status: DC

## 2019-11-07 ENCOUNTER — Other Ambulatory Visit: Payer: Medicaid Other | Admitting: Obstetrics & Gynecology

## 2019-11-28 DIAGNOSIS — M25559 Pain in unspecified hip: Secondary | ICD-10-CM | POA: Diagnosis not present

## 2019-11-28 DIAGNOSIS — G8929 Other chronic pain: Secondary | ICD-10-CM | POA: Diagnosis not present

## 2019-11-28 DIAGNOSIS — R519 Headache, unspecified: Secondary | ICD-10-CM | POA: Diagnosis not present

## 2019-11-28 DIAGNOSIS — R252 Cramp and spasm: Secondary | ICD-10-CM | POA: Diagnosis not present

## 2019-12-19 DIAGNOSIS — H6693 Otitis media, unspecified, bilateral: Secondary | ICD-10-CM | POA: Diagnosis not present

## 2019-12-19 DIAGNOSIS — J029 Acute pharyngitis, unspecified: Secondary | ICD-10-CM | POA: Diagnosis not present

## 2019-12-19 DIAGNOSIS — R0981 Nasal congestion: Secondary | ICD-10-CM | POA: Diagnosis not present

## 2020-01-14 ENCOUNTER — Other Ambulatory Visit: Payer: Self-pay | Admitting: Obstetrics & Gynecology

## 2020-01-14 DIAGNOSIS — J029 Acute pharyngitis, unspecified: Secondary | ICD-10-CM | POA: Diagnosis not present

## 2020-01-14 DIAGNOSIS — H6983 Other specified disorders of Eustachian tube, bilateral: Secondary | ICD-10-CM | POA: Diagnosis not present

## 2020-01-14 DIAGNOSIS — R05 Cough: Secondary | ICD-10-CM | POA: Diagnosis not present

## 2020-01-14 DIAGNOSIS — J0100 Acute maxillary sinusitis, unspecified: Secondary | ICD-10-CM | POA: Diagnosis not present

## 2020-02-12 DIAGNOSIS — E1140 Type 2 diabetes mellitus with diabetic neuropathy, unspecified: Secondary | ICD-10-CM | POA: Diagnosis not present

## 2020-02-12 DIAGNOSIS — Z794 Long term (current) use of insulin: Secondary | ICD-10-CM | POA: Diagnosis not present

## 2020-02-12 DIAGNOSIS — E1165 Type 2 diabetes mellitus with hyperglycemia: Secondary | ICD-10-CM | POA: Diagnosis not present

## 2020-02-13 ENCOUNTER — Ambulatory Visit: Payer: Medicaid Other | Admitting: Family Medicine

## 2020-02-20 DIAGNOSIS — G43009 Migraine without aura, not intractable, without status migrainosus: Secondary | ICD-10-CM | POA: Diagnosis not present

## 2020-02-20 DIAGNOSIS — R252 Cramp and spasm: Secondary | ICD-10-CM | POA: Diagnosis not present

## 2020-02-20 DIAGNOSIS — M542 Cervicalgia: Secondary | ICD-10-CM | POA: Diagnosis not present

## 2020-02-20 DIAGNOSIS — G629 Polyneuropathy, unspecified: Secondary | ICD-10-CM | POA: Diagnosis not present

## 2020-03-24 ENCOUNTER — Ambulatory Visit: Payer: Medicaid Other | Admitting: Family Medicine

## 2020-04-06 ENCOUNTER — Other Ambulatory Visit: Payer: Self-pay | Admitting: Obstetrics & Gynecology

## 2020-04-10 DIAGNOSIS — H6983 Other specified disorders of Eustachian tube, bilateral: Secondary | ICD-10-CM | POA: Diagnosis not present

## 2020-04-10 DIAGNOSIS — J029 Acute pharyngitis, unspecified: Secondary | ICD-10-CM | POA: Diagnosis not present

## 2020-04-10 DIAGNOSIS — J0100 Acute maxillary sinusitis, unspecified: Secondary | ICD-10-CM | POA: Diagnosis not present

## 2020-04-24 ENCOUNTER — Encounter: Payer: Self-pay | Admitting: Family Medicine

## 2020-04-24 ENCOUNTER — Other Ambulatory Visit: Payer: Self-pay

## 2020-04-24 ENCOUNTER — Ambulatory Visit: Payer: Medicaid Other | Admitting: Family Medicine

## 2020-04-24 VITALS — BP 108/73 | HR 79 | Temp 98.2°F | Ht 62.0 in | Wt 127.2 lb

## 2020-04-24 DIAGNOSIS — Z7689 Persons encountering health services in other specified circumstances: Secondary | ICD-10-CM

## 2020-04-24 DIAGNOSIS — G43009 Migraine without aura, not intractable, without status migrainosus: Secondary | ICD-10-CM | POA: Diagnosis not present

## 2020-04-24 DIAGNOSIS — Z794 Long term (current) use of insulin: Secondary | ICD-10-CM

## 2020-04-24 DIAGNOSIS — Z23 Encounter for immunization: Secondary | ICD-10-CM | POA: Diagnosis not present

## 2020-04-24 DIAGNOSIS — H6982 Other specified disorders of Eustachian tube, left ear: Secondary | ICD-10-CM

## 2020-04-24 DIAGNOSIS — G629 Polyneuropathy, unspecified: Secondary | ICD-10-CM

## 2020-04-24 DIAGNOSIS — E1142 Type 2 diabetes mellitus with diabetic polyneuropathy: Secondary | ICD-10-CM

## 2020-04-24 NOTE — Progress Notes (Signed)
Subjective: HY:IFOYDXAJO care, DM2, HTN, HLD, GAD, Migraines HPI: [name] is a 39 y.o. female presenting to clinic today for:  1. Type 2 Diabetes w/ HLD:  Patient reports she is closely followed by endocrinology with [facility].  She reports that her blood sugar was up slightly at last visit and they are working on her medication regimen.  Currently taking Lantus, Ozempic.  She was prescribed a cholesterol medicine as well.  She is not on any high blood pressure medicines.  No history of diabetic foot ulcers but does sometimes get sacral ulcers and this is why she uses topical Silvadene.  She does have chronic neuropathy but this is noted to be a small fiber neuropathy and not from her diabetes.  She is on a regimen of gabapentin as prescribed by neurology.  This consists of 1200 mg each morning and 1800 mg each evening.  Denies any excessive sedation or falls.  Last eye exam: Up-to-date Last foot exam: Up-to-date with endocrinology Last A1c:  Lab Results  Component Value Date   HGBA1C 9.2 (H) 07/22/2015   Nephropathy screen indicated?:  Up-to-date per patient's report Last flu, zoster and/or pneumovax: Needs pneumococcal vaccinations Immunization History  Administered Date(s) Administered  . Influenza Inj Mdck Quad Pf 06/30/2016  . Influenza,inj,Quad PF,6+ Mos 06/30/2016, 08/22/2017  . Influenza-Unspecified 08/22/2017  . Tdap 04/25/2018    2. GAD Managed by her OB/GYN.  She takes Lexapro 20 mg daily and Xanax 1 mg daily as needed.  Again denies any memory loss, respiratory depression, falls or excessive daytime sleepiness  3. Migraine headaches She sees a neurologist for this.  She reports fairly good control of migraine headaches with nortriptyline 75 mg daily.  She no longer takes Fioricet.  She does occasionally have left-sided earache.  She has been treated for URIs x3 this year.  Does not report any drainage or fevers from the ear.   Past Medical History:  Diagnosis  Date  . Anemia   . Anxiety   . Constipation   . Diabetes mellitus without complication ([place])   . Family history of malignant neoplasm of gastrointestinal tract   . Gastroparesis   . IBS (irritable bowel syndrome)    constipation  . Migraines   . Neuropathy due to type 2 diabetes mellitus [facility])    Past Surgical History:  Procedure Laterality Date  . ABDOMINAL HYSTERECTOMY N/A 06/29/2016   Procedure: TOTAL ABDOMINAL HYSTERECTOMY;  Surgeon: Florian Buff, MD;  Location: [facility];  Service: Gynecology;  Laterality: N/A;  . CESAREAN SECTION    . CHOLECYSTECTOMY  2007  . EYE SURGERY  1989  . SALPINGOOPHORECTOMY Bilateral 06/29/2016   Procedure: BILATERAL SALPINGO OOPHORECTOMY;  Surgeon: Florian Buff, MD;  Location: [facility];  Service: Gynecology;  Laterality: Bilateral;  . TONSILLECTOMY  1992   Social History   Socioeconomic History  . Marital status: Divorced    Spouse name: Not on file  . Number of children: 2  . Years of education: Not on file  . Highest education level: Some college, no degree  Occupational History  . Occupation: Actuary    Comment: part time  . 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 Smoking status: Former Smoker    Years: 10.00    Types: Cigarettes    Quit date: 06/24/2014    Years since quitting: 5.8  . Smokeless tobacco: Never Used  Vaping Use  . Vaping Use: Never used  Substance and Sexual Activity  . Alcohol use: Yes    Comment: once in a while  . Drug use: No  . Sexual activity: Not Currently    Birth control/protection: Surgical    Comment: hyst  Other Topics Concern  . Not on file  Social History Narrative  . Not on file   Social Determinants of Health   Financial Resource Strain:   . Difficulty of Paying Living Expenses:   Food Insecurity:   . Worried About Charity fundraiser in the Last Year:   . Arboriculturist in the Last Year:   Transportation Needs:   . Film/video editor  (Medical):   Marland Kitchen Lack of Transportation (Non-Medical):   Physical Activity:   . Days of Exercise per Week:   . Minutes of Exercise per Session:   Stress:   . Feeling of Stress :   Social Connections:   . Frequency of Communication with Friends and Family:   . Frequency of Social Gatherings with Friends and Family:   . Attends Religious Services:   . Active Member of Clubs or Organizations:   . Attends Archivist Meetings:   Marland Kitchen Marital Status:   Intimate Partner Violence:   . Fear of Current or Ex-Partner:   . Emotionally Abused:   Marland Kitchen Physically Abused:   . Sexually Abused:    Current Meds  Medication Sig  . Accu-Chek FastClix Lancets MISC Use to monitor blood glucose 3 time(s) daily  . ALPRAZolam (XANAX) 1 MG tablet TAKE 1 TABLET DAILY AS NEEDED  . atorvastatin (LIPITOR) 10 MG tablet Take 10 mg by mouth daily.  . Blood Glucose Monitoring Suppl (ACCU-CHEK GUIDE) w/Device KIT 1 each by Does not applyoroute once for 1 dose. Use as directed by physician to monitor blood sugars  . escitalopram (LEXAPRO) 20 MG tablet Take 1 tablet by mouth once daily  . gabapentin (NEURONTIN) 300 MG capsule Take 300 mg by mouth 4 (four) times daily.  Marland Kitchen gabapentin (NEURONTIN) 600 MG tablet Take 600 mg by mouth 3 (three) times daily.  Marland Kitchen glucose blood (ACCU-CHEK GUIDE) test strip Use to monitor blood glucose 3 time(s) daily  . Insulin Glargine (LANTUS SOLOSTAR) 100 UNIT/ML Solostar Pen Inject 25 Units into the skin at bedtime.  . Insulin Pen Needle (BD PEN NEEDLE NANO U/F) 32G X 4 MM MISC Use with insulin and victoza daily  . magnesium oxide (MAG-OX) 400 MG tablet Take 400 mg by mouth daily.  . nortriptyline (PAMELOR) 75 MG capsule Take 75 mg by mouth daily.  . pantoprazole (PROTONIX) 20 MG tablet Take 40 mg by mouth daily.   Marland Kitchen PAZEO 0.7 % SOLN Apply 1 drop to eye every morning.  . RESTASIS 0.05 % ophthalmic emulsion 1 drop 2 (two) times daily.  . Semaglutide (OZEMPIC, 0.25 OR 0.5 MG/DOSE, [place])  Inject into the skin.  Marland Kitchen silver sulfADIAZINE (SILVADENE) 1 % cream Apply 3-4 times daily (Patient taking differently: Prn)  . silver sulfADIAZINE (SILVADENE) 1 % cream Use to area 3 times per day  . valACYclovir (VALTREX) 1000 MG tablet TAKE 1 TABLET ONCE A DAY   Family History  Problem Relation Age of Onset  . Other Paternal Grandfather        fell down stairs  . Other Paternal Grandmother        fell down stairs  . Alzheimer's disease Maternal Grandmother   . Dementia Maternal Grandmother   . Colon cancer Maternal Grandfather   . Other  Mother        back pain; nerve damage  . Hypertension Mother   . Hemophilia Son    Allergies  Allergen Reactions  . Celebrex [Celecoxib]   . Latex   . Sulfonamide Derivatives      Health Maintenance: has history of total hysterectomy   ROS: Per HPI  Objective: Office vital signs reviewed. BP 108/73   Pulse 79   Temp 98.2 F (36.8 C)   Ht '5\' 2"'  (1.575 m)   Wt 127 lb 3.2 oz (57.7 kg)   LMP 06/12/2016   SpO2 95%   BMI 23.27 kg/m   Physical Examination:  General: Awake, alert, well nourished, No acute distress HEENT: Normal, sclera white, MMM; bilateral TMs without perforation, erythema or purulence.  Left TM slightly bulging. Cardio: regular rate and rhythm, S1S2 heard, no murmurs appreciated Pulm: clear to auscultation bilaterally, no wheezes, rhonchi or rales; normal work of breathing on room air Extremities: warm, well perfused, No edema, cyanosis or clubbing; +2 pulses bilaterally MSK: normal gait and station Skin: dry; intact; no rashes or lesions  Assessment/ Plan: 39 y.o. female   1. Type 2 diabetes mellitus with diabetic polyneuropathy, with long-term current use of insulin ([place]) Not well controlled but she is closely followed by endocrinology.  Recently started Ozempic.  She should be seeing them soon in the next month or so.  I updated the EMR to reflect the urine microalbumin, foot exam and A1c that was obtained in  May.  2. Establishing care with new doctor, encounter for She has history of total hysterectomy.  Pap smear recommendations has been removed from her chart  3. Need for vaccination against Streptococcus pneumoniae Administered during today's visit. - Pneumococcal polysaccharide vaccine 23-valent greater than or equal to 2yo subcutaneous/IM  4. Migraine without aura and without status migrainosus, not intractable Continue current regimen.  Follow-up with neurology as scheduled  5. Small fiber neuropathy Continue to follow with neurology as scheduled.  6. Eustachian tube dysfunction, left Galbreath and other OMT maneuver demonstrated to patient to promote eustachian tube drainage.  Continue antihistamine and nasal spray  Janora Norlander, DO [place] [phone]

## 2020-05-13 ENCOUNTER — Other Ambulatory Visit: Payer: Self-pay | Admitting: Obstetrics & Gynecology

## 2020-05-28 ENCOUNTER — Encounter: Payer: Self-pay | Admitting: Family Medicine

## 2020-05-29 ENCOUNTER — Telehealth: Payer: Self-pay | Admitting: Obstetrics & Gynecology

## 2020-05-29 MED ORDER — DOXYCYCLINE HYCLATE 100 MG PO TABS
100.0000 mg | ORAL_TABLET | Freq: Two times a day (BID) | ORAL | 0 refills | Status: DC
Start: 2020-05-29 — End: 2020-07-01

## 2020-05-29 MED ORDER — SILVER SULFADIAZINE 1 % EX CREA: TOPICAL_CREAM | CUTANEOUS | 11 refills | Status: DC

## 2020-05-29 NOTE — Telephone Encounter (Addendum)
Pt has a diabetic ulcer. Her PCP is out of the office today and next week. Dr. Despina Hidden has prescribed silvadine cream and Doxy before. Can you prescribe this again? Thanks!! JSY

## 2020-05-29 NOTE — Telephone Encounter (Signed)
Pt needs to talk to nurse about getting Dr Despina Hidden to call in meds he usually calls in for her when she needs for her diabetic ulcer her Primary Dr out of office and they told her to call [facility] to get refill . Please call pt

## 2020-06-22 ENCOUNTER — Other Ambulatory Visit: Payer: Medicaid Other | Admitting: Obstetrics & Gynecology

## 2020-07-01 ENCOUNTER — Ambulatory Visit (INDEPENDENT_AMBULATORY_CARE_PROVIDER_SITE_OTHER): Payer: Medicaid Other | Admitting: Family Medicine

## 2020-07-01 DIAGNOSIS — L03115 Cellulitis of right lower limb: Secondary | ICD-10-CM | POA: Diagnosis not present

## 2020-07-01 DIAGNOSIS — M79671 Pain in right foot: Secondary | ICD-10-CM | POA: Diagnosis not present

## 2020-07-01 DIAGNOSIS — M722 Plantar fascial fibromatosis: Secondary | ICD-10-CM | POA: Diagnosis not present

## 2020-07-01 MED ORDER — AMOXICILLIN-POT CLAVULANATE 875-125 MG PO TABS
1.0000 | ORAL_TABLET | Freq: Two times a day (BID) | ORAL | 0 refills | Status: DC
Start: 2020-07-01 — End: 2021-01-27

## 2020-07-01 NOTE — Progress Notes (Signed)
    Subjective:    Patient ID: Domingo Mend, female    DOB: 08-Mar-1981, 39 y.o.   MRN: [id number]   HPI: KIMBRA MARCELINO is a 39 y.o. female presenting for foot pain. Onset yesterday upon awakening had pain. Felt weak last night. Then last night noted it was twice its normal size and red. Denies being bit. NKI. Worst at backside of right 1st toe. Can't flatten foot due to swelling. Hurts to touch. Any pressure hurts really bad. Denies renal failure.    Depression screen [facility] 2/9 04/24/2020 10/23/2018 10/05/2015  Decreased Interest 0 0 0  Down, Depressed, Hopeless 0 0 0  PHQ - 2 Score 0 0 0     Relevant past medical, surgical, family and social history reviewed and updated as indicated.  Interim medical history since our last visit reviewed. Allergies and medications reviewed and updated.  ROS:  Review of Systems  Constitutional: Positive for activity change. Negative for appetite change and fever.  Musculoskeletal: Positive for arthralgias, gait problem and joint swelling.     Social History   Tobacco Use  Smoking Status Former Smoker  . Years: 10.00  . Types: Cigarettes  . Quit date: 06/24/2014  . Years since quitting: 6.0  Smokeless Tobacco Never Used       Objective:     Wt Readings from Last 3 Encounters:  04/24/20 127 lb 3.2 oz (57.7 kg)  10/23/18 137 lb (62.1 kg)  01/18/18 135 lb 9.6 oz (61.5 kg)     Exam deferred. Pt. Harboring due to COVID 19. Phone visit performed.   Assessment & Plan:   1. Cellulitis of right lower extremity     Meds ordered this encounter  Medications  . amoxicillin-clavulanate (AUGMENTIN) 875-125 MG tablet    Sig: Take 1 tablet by mouth 2 (two) times daily. Take all of this medication    Dispense:  20 tablet    Refill:  0    No orders of the defined types were placed in this encounter.     Diagnoses and all orders for this visit:  Cellulitis of right lower extremity  Other orders -     amoxicillin-clavulanate  (AUGMENTIN) 875-125 MG tablet; Take 1 tablet by mouth 2 (two) times daily. Take all of this medication    Virtual Visit via telephone Note  I discussed the limitations, risks, security and privacy concerns of performing an evaluation and management service by telephone and the availability of in person appointments. The patient was identified with two identifiers. Pt.expressed understanding and agreed to proceed. Pt. Is at home. Dr. Darlyn Read is in his office.  Follow Up Instructions:   I discussed the assessment and treatment plan with the patient. The patient was provided an opportunity to ask questions and all were answered. The patient agreed with the plan and demonstrated an understanding of the instructions.   The patient was advised to call back or seek an in-person evaluation if the symptoms worsen or if the condition fails to improve as anticipated.   Total minutes including chart review and phone contact time: 13   Follow up plan: Return if symptoms worsen or fail to improve.  Mechele Claude, MD [place] [facility]

## 2020-07-05 ENCOUNTER — Encounter: Payer: Self-pay | Admitting: Family Medicine

## 2020-07-13 ENCOUNTER — Other Ambulatory Visit: Payer: Self-pay | Admitting: Obstetrics & Gynecology

## 2020-10-20 DIAGNOSIS — M542 Cervicalgia: Secondary | ICD-10-CM | POA: Diagnosis not present

## 2020-10-20 DIAGNOSIS — G43009 Migraine without aura, not intractable, without status migrainosus: Secondary | ICD-10-CM | POA: Diagnosis not present

## 2020-10-20 DIAGNOSIS — M62838 Other muscle spasm: Secondary | ICD-10-CM | POA: Diagnosis not present

## 2020-10-20 DIAGNOSIS — R519 Headache, unspecified: Secondary | ICD-10-CM | POA: Diagnosis not present

## 2020-10-20 DIAGNOSIS — G8929 Other chronic pain: Secondary | ICD-10-CM | POA: Diagnosis not present

## 2020-11-03 ENCOUNTER — Other Ambulatory Visit: Payer: Medicaid Other | Admitting: Obstetrics & Gynecology

## 2021-01-05 ENCOUNTER — Other Ambulatory Visit: Payer: Self-pay | Admitting: Obstetrics & Gynecology

## 2021-01-27 ENCOUNTER — Other Ambulatory Visit: Payer: Self-pay

## 2021-01-27 ENCOUNTER — Encounter: Payer: Self-pay | Admitting: Family Medicine

## 2021-01-27 ENCOUNTER — Ambulatory Visit: Payer: Medicaid Other | Admitting: Family Medicine

## 2021-01-27 VITALS — BP 136/86 | HR 82 | Ht 62.0 in | Wt 121.0 lb

## 2021-01-27 DIAGNOSIS — N644 Mastodynia: Secondary | ICD-10-CM

## 2021-01-27 DIAGNOSIS — M79622 Pain in left upper arm: Secondary | ICD-10-CM

## 2021-01-27 MED ORDER — CEPHALEXIN 500 MG PO CAPS: 500.0000 mg | ORAL_CAPSULE | Freq: Four times a day (QID) | ORAL | 0 refills | Status: DC

## 2021-01-27 NOTE — Progress Notes (Signed)
BP 136/86   Pulse 82   Ht 5\' 2"  (1.575 m)   Wt 121 lb (54.9 kg)   LMP 06/12/2016   SpO2 99%   BMI 22.13 kg/m    Subjective:   Patient ID: 06/14/2016, female    DOB: 05-Jan-1981, 40 y.o.   MRN: [id number]  HPI: [name] is a 40 y.o. female presenting on 01/27/2021 for Cyst (Left axilla. Pain and swelling)   HPI Left axillary and left breast pain. Patient is coming in for left axillary and left breast pain.  She says she recalls that the left breast soreness and tenderness especially on the lower part of her left breast started a couple weeks ago but over the past couple days she has gotten significantly sore in the left axilla going all the way up to the underarm of her upper arm and down towards her ribs on that left axilla.  She says it hurts to move that arm around and move it up overhead.  She cannot recall any specific trauma or incident.  She says now that she started having some soreness on the right breast and right axilla just in the past day as well.  Patient had a total hysterectomy and did not tolerate HRT so she is essentially postmenopausal.  Relevant past medical, surgical, family and social history reviewed and updated as indicated. Interim medical history since our last visit reviewed. Allergies and medications reviewed and updated.  Review of Systems  Constitutional: Negative for chills and fever.  Eyes: Negative for redness and visual disturbance.  Respiratory: Negative for chest tightness and shortness of breath.   Cardiovascular: Negative for chest pain, palpitations and leg swelling.  Skin: Negative for color change, rash and wound.  Neurological: Negative for light-headedness and headaches.  Psychiatric/Behavioral: Negative for agitation and behavioral problems.  All other systems reviewed and are negative.   Per HPI unless specifically indicated above     Objective:   BP 136/86   Pulse 82   Ht 5\' 2"  (1.575 m)   Wt 121 lb (54.9 kg)    LMP 06/12/2016   SpO2 99%   BMI 22.13 kg/m   Wt Readings from Last 3 Encounters:  01/27/21 121 lb (54.9 kg)  04/24/20 127 lb 3.2 oz (57.7 kg)  10/23/18 137 lb (62.1 kg)    Physical Exam Vitals and nursing note reviewed. Exam conducted with a chaperone present.  Constitutional:      Appearance: Normal appearance.  Chest:     Chest wall: Tenderness present. No mass or deformity.  Breasts: Breasts are symmetrical.     Right: Tenderness present.     Left: Tenderness present.     Neurological:     Mental Status: She is alert.       Assessment & Plan:   Problem List Items Addressed This Visit   None   Visit Diagnoses    Left axillary pain    -  Primary   Relevant Medications   cephALEXin (KEFLEX) 500 MG capsule   Other Relevant Orders   MM DIAG BREAST TOMO BILATERAL   Breast pain, left       Relevant Orders   MM DIAG BREAST TOMO BILATERAL      Will do Keflex for possible lymphadenopathy to treat like infectious source but will recommend mammogram as well because she is of age and never had 1. Follow up plan: Return if symptoms worsen or fail to improve.  Counseling provided for all of  the vaccine components Orders Placed This Encounter  Procedures  . MM DIAG BREAST TOMO BILATERAL    Arville Care, MD [facility] 01/27/2021, 12:13 PM

## 2021-02-14 DIAGNOSIS — H5213 Myopia, bilateral: Secondary | ICD-10-CM | POA: Diagnosis not present

## 2021-03-01 ENCOUNTER — Ambulatory Visit: Payer: Medicaid Other | Admitting: Family Medicine

## 2021-03-01 ENCOUNTER — Other Ambulatory Visit: Payer: Self-pay

## 2021-03-01 ENCOUNTER — Encounter: Payer: Self-pay | Admitting: Family Medicine

## 2021-03-01 VITALS — BP 127/80 | HR 102 | Ht 62.0 in | Wt 121.0 lb

## 2021-03-01 DIAGNOSIS — J40 Bronchitis, not specified as acute or chronic: Secondary | ICD-10-CM

## 2021-03-01 MED ORDER — PREDNISONE 20 MG PO TABS: ORAL_TABLET | ORAL | 0 refills | Status: DC

## 2021-03-01 NOTE — Progress Notes (Signed)
BP 127/80   Pulse (!) 102   Ht '5\' 2"'  (1.575 m)   Wt 121 lb (54.9 kg)   LMP 06/12/2016   SpO2 96%   BMI 22.13 kg/m    Subjective:   Patient ID: [name], female    DOB: May 03, 1981, 41 y.o.   MRN: [id number]  HPI: [name] is a 40 y.o. female presenting on 03/01/2021 for nodules  (Axilla. Denies fever. Not able to cough up anything. Has some pain under arms and in her back.) and Cough (Unable to cough up mucous. Hx of bronchitis)   HPI Patient is coming in with cough and congestion and pain in her left side of her chest especially when coughing.  She says this is started about a week and a half ago.  She does have a mammogram scheduled for the left lymph nodes in her axilla but those have been improving since she did the antibiotic.  She still is sore there but they are improved.  She is coughing up green mucus and phlegm.  She denies any fevers or chills or shortness of breath.  She did have to use albuterol inhaler last night because of a coughing spell and it did help.  She keeps it just in case and does not have to use it very often but then this time.  Relevant past medical, surgical, family and social history reviewed and updated as indicated. Interim medical history since our last visit reviewed. Allergies and medications reviewed and updated.  Review of Systems  Constitutional:  Negative for chills and fever.  Eyes:  Negative for visual disturbance.  Respiratory:  Positive for cough. Negative for shortness of breath and wheezing.   Cardiovascular:  Positive for chest pain. Negative for palpitations and leg swelling.  Skin:  Negative for rash.  Neurological:  Negative for light-headedness and headaches.  Psychiatric/Behavioral:  Negative for agitation and behavioral problems.   All other systems reviewed and are negative.  Per HPI unless specifically indicated above   Allergies as of 03/01/2021       Reactions   Celebrex [celecoxib]    Latex     Sulfonamide Derivatives         Medication List        Accurate as of March 01, 2021  2:24 PM. If you have any questions, ask your nurse or doctor.          Accu-Chek FastClix Lancets Misc Use to monitor blood glucose 3 time(s) daily   Accu-Chek Guide w/Device Kit 1 each by Does not applyoroute once for 1 dose. Use as directed by physician to monitor blood sugars   ALPRAZolam 1 MG tablet Commonly known as: XANAX TAKE 1 TABLET DAILY AS NEEDED   atorvastatin 10 MG tablet Commonly known as: LIPITOR Take 10 mg by mouth daily.   BD Pen Needle Nano U/F 32G X 4 MM Misc Generic drug: Insulin Pen Needle Use with insulin and victoza daily   cephALEXin 500 MG capsule Commonly known as: KEFLEX Take 1 capsule (500 mg total) by mouth 4 (four) times daily.   escitalopram 20 MG tablet Commonly known as: LEXAPRO TAKE 1 TABLET ONCE A DAY   gabapentin 300 MG capsule Commonly known as: NEURONTIN Take 300 mg by mouth 4 (four) times daily.   gabapentin 600 MG tablet Commonly known as: NEURONTIN Take 600 mg by mouth 3 (three) times daily.   Lantus SoloStar 100 UNIT/ML Solostar Pen Generic drug: insulin glargine Inject 25 Units  into the skin at bedtime.   magnesium oxide 400 MG tablet Commonly known as: MAG-OX Take 400 mg by mouth daily.   nortriptyline 75 MG capsule Commonly known as: PAMELOR Take 75 mg by mouth daily.   OZEMPIC (0.25 OR 0.5 MG/DOSE) [place] Inject into the skin.   pantoprazole 40 MG tablet Commonly known as: PROTONIX TAKE 1 TABLET ONCE A DAY   Pazeo 0.7 % Soln Generic drug: Olopatadine HCl Apply 1 drop to eye every morning.   Restasis 0.05 % ophthalmic emulsion Generic drug: cycloSPORINE 1 drop 2 (two) times daily.   silver sulfADIAZINE 1 % cream Commonly known as: Silvadene Use to area 3 times daily   valACYclovir 1000 MG tablet Commonly known as: VALTREX TAKE 1 TABLET ONCE A DAY         Objective:   BP 127/80   Pulse (!) 102   Ht 5'  2" (1.575 m)   Wt 121 lb (54.9 kg)   LMP 06/12/2016   SpO2 96%   BMI 22.13 kg/m   Wt Readings from Last 3 Encounters:  03/01/21 121 lb (54.9 kg)  01/27/21 121 lb (54.9 kg)  04/24/20 127 lb 3.2 oz (57.7 kg)    Physical Exam Vitals and nursing note reviewed.  Constitutional:      General: She is not in acute distress.    Appearance: She is well-developed. She is not diaphoretic.  Eyes:     Conjunctiva/sclera: Conjunctivae normal.  Cardiovascular:     Rate and Rhythm: Normal rate and regular rhythm.     Heart sounds: Normal heart sounds. No murmur heard. Pulmonary:     Effort: Pulmonary effort is normal. No respiratory distress.     Breath sounds: Normal breath sounds. No wheezing.  Chest:     Chest wall: Tenderness (Left axillary chest wall tenderness) present.  Musculoskeletal:        General: No tenderness. Normal range of motion.  Skin:    General: Skin is warm and dry.     Findings: No rash.  Neurological:     Mental Status: She is alert and oriented to person, place, and time.     Coordination: Coordination normal.  Psychiatric:        Behavior: Behavior normal.      Assessment & Plan:   Problem List Items Addressed This Visit   None Visit Diagnoses     Bronchitis    -  Primary   Relevant Medications   predniSONE (DELTASONE) 20 MG tablet       Will give short course of prednisone to see if we can decrease the inflammation, continue with her albuterol inhaler and recommended Mucinex as well.  If anything worsens or does not improve she is to let us know. Follow up plan: Return if symptoms worsen or fail to improve.  Counseling provided for all of the vaccine components No orders of the defined types were placed in this encounter.   Caryl Pina, MD [place] [facility] 03/01/2021, 2:24 PM

## 2021-03-09 ENCOUNTER — Other Ambulatory Visit: Payer: Self-pay

## 2021-03-09 ENCOUNTER — Other Ambulatory Visit: Payer: Medicaid Other

## 2021-03-09 ENCOUNTER — Other Ambulatory Visit: Payer: Self-pay | Admitting: Family Medicine

## 2021-03-09 ENCOUNTER — Ambulatory Visit
Admission: RE | Admit: 2021-03-09 | Discharge: 2021-03-09 | Disposition: A | Discharge status: Home / Self Care | Payer: Medicaid Other | Source: Ambulatory Visit | Attending: Family Medicine | Admitting: Family Medicine

## 2021-03-09 DIAGNOSIS — N644 Mastodynia: Secondary | ICD-10-CM

## 2021-03-09 DIAGNOSIS — N6311 Unspecified lump in the right breast, upper outer quadrant: Secondary | ICD-10-CM | POA: Diagnosis not present

## 2021-03-09 DIAGNOSIS — R922 Inconclusive mammogram: Secondary | ICD-10-CM | POA: Diagnosis not present

## 2021-03-09 DIAGNOSIS — M79622 Pain in left upper arm: Secondary | ICD-10-CM

## 2021-03-19 ENCOUNTER — Ambulatory Visit
Admission: RE | Admit: 2021-03-19 | Discharge: 2021-03-19 | Disposition: A | Discharge status: Home / Self Care | Payer: Medicaid Other | Source: Ambulatory Visit | Attending: Family Medicine | Admitting: Family Medicine

## 2021-03-19 DIAGNOSIS — N6313 Unspecified lump in the right breast, lower outer quadrant: Secondary | ICD-10-CM | POA: Diagnosis not present

## 2021-03-19 DIAGNOSIS — D241 Benign neoplasm of right breast: Secondary | ICD-10-CM | POA: Diagnosis not present

## 2021-03-19 DIAGNOSIS — N644 Mastodynia: Secondary | ICD-10-CM

## 2021-03-19 DIAGNOSIS — M79622 Pain in left upper arm: Secondary | ICD-10-CM

## 2021-03-19 DIAGNOSIS — N6311 Unspecified lump in the right breast, upper outer quadrant: Secondary | ICD-10-CM | POA: Diagnosis not present

## 2021-03-19 DIAGNOSIS — N6011 Diffuse cystic mastopathy of right breast: Secondary | ICD-10-CM | POA: Diagnosis not present

## 2021-03-24 ENCOUNTER — Other Ambulatory Visit: Payer: Self-pay | Admitting: Family Medicine

## 2021-03-24 DIAGNOSIS — N6310 Unspecified lump in the right breast, unspecified quadrant: Secondary | ICD-10-CM

## 2021-04-15 ENCOUNTER — Ambulatory Visit
Admission: RE | Admit: 2021-04-15 | Discharge: 2021-04-15 | Disposition: A | Discharge status: Home / Self Care | Payer: Medicaid Other | Source: Ambulatory Visit | Attending: Family Medicine | Admitting: Family Medicine

## 2021-04-15 ENCOUNTER — Other Ambulatory Visit: Payer: Self-pay

## 2021-04-15 ENCOUNTER — Other Ambulatory Visit: Payer: Self-pay | Admitting: Obstetrics & Gynecology

## 2021-04-15 DIAGNOSIS — N6310 Unspecified lump in the right breast, unspecified quadrant: Secondary | ICD-10-CM

## 2021-04-15 DIAGNOSIS — N6313 Unspecified lump in the right breast, lower outer quadrant: Secondary | ICD-10-CM | POA: Diagnosis not present

## 2021-04-26 ENCOUNTER — Ambulatory Visit: Payer: Medicaid Other | Admitting: Family Medicine

## 2021-05-07 ENCOUNTER — Encounter: Payer: Self-pay | Admitting: Family Medicine

## 2021-05-07 ENCOUNTER — Ambulatory Visit: Payer: Medicaid Other | Admitting: Family Medicine

## 2021-05-07 ENCOUNTER — Other Ambulatory Visit: Payer: Self-pay

## 2021-05-07 ENCOUNTER — Ambulatory Visit (INDEPENDENT_AMBULATORY_CARE_PROVIDER_SITE_OTHER): Payer: Medicaid Other

## 2021-05-07 VITALS — BP 134/83 | HR 75 | Temp 97.9°F | Ht 62.0 in | Wt 113.2 lb

## 2021-05-07 DIAGNOSIS — Z91038 Other insect allergy status: Secondary | ICD-10-CM | POA: Diagnosis not present

## 2021-05-07 DIAGNOSIS — Z794 Long term (current) use of insulin: Secondary | ICD-10-CM

## 2021-05-07 DIAGNOSIS — E1169 Type 2 diabetes mellitus with other specified complication: Secondary | ICD-10-CM | POA: Diagnosis not present

## 2021-05-07 DIAGNOSIS — E1142 Type 2 diabetes mellitus with diabetic polyneuropathy: Secondary | ICD-10-CM

## 2021-05-07 DIAGNOSIS — R634 Abnormal weight loss: Secondary | ICD-10-CM | POA: Diagnosis not present

## 2021-05-07 DIAGNOSIS — R590 Localized enlarged lymph nodes: Secondary | ICD-10-CM

## 2021-05-07 DIAGNOSIS — R591 Generalized enlarged lymph nodes: Secondary | ICD-10-CM | POA: Diagnosis not present

## 2021-05-07 DIAGNOSIS — H60332 Swimmer's ear, left ear: Secondary | ICD-10-CM

## 2021-05-07 DIAGNOSIS — E785 Hyperlipidemia, unspecified: Secondary | ICD-10-CM

## 2021-05-07 LAB — BAYER DCA HB A1C WAIVED: HB A1C (BAYER DCA - WAIVED): 10.8 % — ABNORMAL HIGH (ref <7.0)

## 2021-05-07 MED ORDER — CIPRODEX 0.3-0.1 % OT SUSP: 4.0000 [drp] | Freq: Two times a day (BID) | OTIC | 0 refills | Status: AC

## 2021-05-07 MED ORDER — TRIAMCINOLONE ACETONIDE 0.1 % EX CREA: 1.0000 "application " | TOPICAL_CREAM | Freq: Two times a day (BID) | CUTANEOUS | 0 refills | Status: DC

## 2021-05-07 MED ORDER — LANTUS SOLOSTAR 100 UNIT/ML ~~LOC~~ SOPN: 25.0000 [IU] | PEN_INJECTOR | Freq: Every day | SUBCUTANEOUS | 99 refills | Status: DC

## 2021-05-07 MED ORDER — ATORVASTATIN CALCIUM 10 MG PO TABS: 10.0000 mg | ORAL_TABLET | Freq: Every day | ORAL | 3 refills | Status: DC

## 2021-05-07 NOTE — Patient Instructions (Addendum)
You had labs performed today.  You will be contacted with the results of the labs once they are available, usually in the next 3 business days for routine lab work.  If you have an active my chart account, they will be released to your MyChart.  If you prefer to have these labs released to you via telephone, please let us know.  Schedule an appointment with endocrinology ASAP!  Lymphadenopathy  Lymphadenopathy means that your lymph glands are swollen or larger than normal. Lymph glands, also called lymph nodes, are collections of tissue that filter excess fluid, bacteria, viruses, and waste from your bloodstream. They are part of your body's disease-fighting system (immune system), which protects your body from germs. There may be different causes of lymphadenopathy, depending on where it is in your body. Some types go away on their own. Lymphadenopathy can occur anywhere that you have lymph glands, including these areas: Neck (cervical lymphadenopathy). Chest (mediastinal lymphadenopathy). Lungs (hilar lymphadenopathy). Underarms (axillary lymphadenopathy). Groin (inguinal lymphadenopathy). When your immune system responds to germs, infection-fighting cells and fluid build up in your lymph glands. This causes some swelling and enlargement. If the lymph nodes do not go back to normal size after you have an infection or disease, your health care provider may do tests. These tests help to monitor your condition and find the reason why the glands are still swollen andenlarged. Follow these instructions at home:  Get plenty of rest. Your health care provider may recommend over-the-counter medicines for pain. Take over-the-counter and prescription medicines only as told by your health care provider. If directed, apply heat to swollen lymph glands as often as told by your health care provider. Use the heat source that your health care provider recommends, such as a moist heat pack or a heating  pad. Place a towel between your skin and the heat source. Leave the heat on for 20-30 minutes. Remove the heat if your skin turns bright red. This is especially important if you are unable to feel pain, heat, or cold. You may have a greater risk of getting burned. Check your affected lymph glands every day for changes. Check other lymph gland areas as told by your health care provider. Check for changes such as: More swelling. Sudden increase in size. Redness or pain. Hardness. Keep all follow-up visits. This is important. Contact a health care provider if you have: Lymph glands that: Are still swollen after 2 weeks. Have suddenly gotten bigger or the swelling spreads. Are red, painful, or hard. Fluid leaking from the skin near an enlarged lymph gland. Problems with breathing. A fever, chills, or night sweats. Fatigue. A sore throat. Pain in your abdomen. Weight loss. Get help right away if you have: Severe pain. Chest pain. Shortness of breath. These symptoms may represent a serious problem that is an emergency. Do not wait to see if the symptoms will go away. Get medical help right away. Call your local emergency services (911 in the [place] Do not drive yourself to the hospital. Summary Lymphadenopathy means that your lymph glands are swollen or larger than normal. Lymph glands, also called lymph nodes, are collections of tissue that filter excess fluid, bacteria, viruses, and waste from the bloodstream. They are part of your body's disease-fighting system (immune system). Lymphadenopathy can occur anywhere that you have lymph glands. If the lymph nodes do not go back to normal size after you have an infection or disease, your health care provider may do tests to monitor your condition and  find the reason why the glands are still swollen and enlarged. Check your affected lymph glands every day for changes. Check other lymph gland areas as told by your health care provider. This  information is not intended to replace advice given to you by your health care provider. Make sure you discuss any questions you have with your healthcare provider. Document Revised: 07/01/2020 Document Reviewed: 07/01/2020 Elsevier Patient Education  2022 [employer] Otitis Externa  Otitis externa is an infection of the outer ear canal. The outer ear canal is the area between the outside of the ear and the eardrum. Otitis externa issometimes called swimmer's ear. What are the causes? Common causes of this condition include: Swimming in dirty water. Moisture in the ear. An injury to the inside of the ear. An object stuck in the ear. A cut or scrape on the outside of the ear. What increases the risk? You are more likely to get this condition if you go swimming often. What are the signs or symptoms? Itching in the ear. This is often the first symptom. Swelling of the ear. Redness in the ear. Ear pain. The pain may get worse when you pull on your ear. Pus coming from the ear. How is this treated? This condition may be treated with: Antibiotic ear drops. These are often given for 10-14 days. Medicines to reduce itching and swelling. Follow these instructions at home: If you were given antibiotic ear drops, use them as told by your doctor. Do not stop using them even if your condition gets better. Take over-the-counter and prescription medicines only as told by your doctor. Avoid getting water in your ears as told by your doctor. You may be told to avoid swimming or water sports for a few days. Keep all follow-up visits as told by your doctor. This is important. How is this prevented? Keep your ears dry. Use the corner of a towel to dry your ears after you swim or bathe. Try not to scratch or put things in your ear. Doing these things makes it easier for germs to grow in your ear. Avoid swimming in lakes, dirty water, or pools that may not have the right amount of a chemical called  chlorine. Contact a doctor if: You have a fever. Your ear is still red, swollen, or painful after 3 days. You still have pus coming from your ear after 3 days. Your redness, swelling, or pain gets worse. You have a really bad headache. You have redness, swelling, pain, or tenderness behind your ear. Summary Otitis externa is an infection of the outer ear canal. Symptoms include pain, redness, and swelling of the ear. If you were given antibiotic ear drops, use them as told by your doctor. Do not stop using them even if your condition gets better. Try not to scratch or put things in your ear. This information is not intended to replace advice given to you by your health care provider. Make sure you discuss any questions you have with your healthcare provider. Document Revised: 02/08/2018 Document Reviewed: 02/09/2018 Elsevier Patient Education  2022 [employer].

## 2021-05-07 NOTE — Progress Notes (Signed)
Subjective: CC: Multiple concerns PCP: Tami Norlander, DO [name] is a 40 y.o. female presenting to clinic today for:  1.  Diabetes with hyperlipidemia Patient mitts that she does not monitor blood sugars nor she totally compliant with Lantus.  She has not seen her endocrinologist in quite some time because an appointment was canceled and she failed to reschedule.  She admits to unplanned weight loss, occasional dizziness.  Treated with Lantus 25 units daily.  Has failed Ozempic and Victoza secondary to severe GI symptoms  2.  Lymphadenopathy Patient reports some that of adenopathy in the axillary regions.  She had mammogram performed which was unrevealing.  She is status post treat with prednisone and Keflex and symptoms are somewhat better but the lymph nodes are still enlarged.  Denies any recent COVID vaccinations, illness, cat scratches.  No known family history of leukemia, lymphomas or other blood cancers.  No recent travel.  No known contact with tuberculosis or other infectious diseases.  No known exposure to HIV.  No previous screening.  She reports unplanned weight loss as above but denies fevers.  3.  Ear pain Patient reports intermittent left-sided ear pain.  She reports occasional dizziness.  Denies any drainage, fevers.  No treatments thus far.  Symptoms of been ongoing for about a week  4.  Skin rash Patient reports several lesions along her upper extremities that occurred after she put on a shirt recently.  She had walked through a spiderweb and was wondering if perhaps these were spider bites.  Denies any mosquitoes in her area.  No fleas.  Lesions are itchy and refractory to use of over-the-counter cortisone cream and Benadryl creams.   ROS: Per HPI  Allergies  Allergen Reactions   Celebrex [Celecoxib]    Latex    Sulfonamide Derivatives    Past Medical History:  Diagnosis Date   Anemia    Anxiety    Constipation    Diabetes mellitus without  complication (HCC)    Family history of malignant neoplasm of gastrointestinal tract    Gastroparesis    IBS (irritable bowel syndrome)    constipation   Migraines    Neuropathy due to type 2 diabetes mellitus (HCC)     Current Outpatient Medications:    Accu-Chek FastClix Lancets MISC, Use to monitor blood glucose 3 time(s) daily, Disp: , Rfl:    ALPRAZolam (XANAX) 1 MG tablet, TAKE 1 TABLET DAILY AS NEEDED, Disp: 30 tablet, Rfl: 5   atorvastatin (LIPITOR) 10 MG tablet, Take 10 mg by mouth daily., Disp: , Rfl:    Blood Glucose Monitoring Suppl (ACCU-CHEK GUIDE) w/Device KIT, 1 each by Does not applyoroute once for 1 dose. Use as directed by physician to monitor blood sugars, Disp: , Rfl:    escitalopram (LEXAPRO) 20 MG tablet, TAKE 1 TABLET ONCE A DAY, Disp: 30 tablet, Rfl: 11   gabapentin (NEURONTIN) 300 MG capsule, Take 300 mg by mouth 4 (four) times daily., Disp: , Rfl:    gabapentin (NEURONTIN) 600 MG tablet, Take 600 mg by mouth 3 (three) times daily., Disp: , Rfl:    Insulin Glargine (LANTUS SOLOSTAR) 100 UNIT/ML Solostar Pen, Inject 25 Units into the skin at bedtime., Disp: , Rfl:    Insulin Pen Needle (BD PEN NEEDLE NANO U/F) 32G X 4 MM MISC, Use with insulin and victoza daily, Disp: , Rfl:    magnesium oxide (MAG-OX) 400 MG tablet, Take 400 mg by mouth daily., Disp: , Rfl:  nortriptyline (PAMELOR) 75 MG capsule, Take 75 mg by mouth daily., Disp: , Rfl:    pantoprazole (PROTONIX) 40 MG tablet, TAKE 1 TABLET ONCE A DAY, Disp: 30 tablet, Rfl: 11   PAZEO 0.7 % SOLN, Apply 1 drop to eye every morning., Disp: , Rfl:    RESTASIS 0.05 % ophthalmic emulsion, 1 drop 2 (two) times daily., Disp: , Rfl:    Semaglutide (OZEMPIC, 0.25 OR 0.5 MG/DOSE, [place]), Inject into the skin., Disp: , Rfl:    silver sulfADIAZINE (SILVADENE) 1 % cream, Use to area 3 times daily, Disp: 50 g, Rfl: 11   valACYclovir (VALTREX) 1000 MG tablet, TAKE 1 TABLET ONCE A DAY, Disp: 30 tablet, Rfl: 11 Social History    Socioeconomic History   Marital status: Divorced    Spouse name: Not on file   Number of children: 2   Years of education: Not on file   Highest education level: Some college, no degree  Occupational History   Occupation: Actuary    Comment: part time   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  Tobacco Use   Smoking status: Former    Years: 10.00    Types: Cigarettes    Quit date: 06/24/2014    Years since quitting: 6.8   Smokeless tobacco: Never  Vaping Use   Vaping Use: Never used  Substance and Sexual Activity   Alcohol use: Yes    Comment: once in a while   Drug use: No   Sexual activity: Not Currently    Birth control/protection: Surgical    Comment: hyst  Other Topics Concern   Not on file  Social History Narrative   Not on file   Social Determinants of Health   Financial Resource Strain: Not on file  Food Insecurity: Not on file  Transportation Needs: Not on file  Physical Activity: Not on file  Stress: Not on file  Social Connections: Not on file  Intimate Partner Violence: Not on file   Family History  Problem Relation Age of Onset   Other Paternal Grandfather        fell down stairs   Other Paternal Grandmother        fell down stairs   Alzheimer's disease Maternal Grandmother    Dementia Maternal Grandmother    Colon cancer Maternal Grandfather    Other Mother        back pain; nerve damage   Hypertension Mother    Hemophilia Son     Objective: Office vital signs reviewed. BP 134/83   Pulse 75   Temp 97.9 F (36.6 C)   Ht '5\' 2"'  (1.575 m)   Wt 113 lb 3.2 oz (51.3 kg)   LMP 06/12/2016   SpO2 94%   BMI 20.70 kg/m   Physical Examination:  General: Awake, alert, thin nontoxic female, No acute distress HEENT: Sclera white.    Ears: TMs intact bilaterally.  She has dulled light reflex on the left.  She also has inflammation noted in the external auditory canal on the left.  No significant drainage or  erythema. Lymph:No cervical lymphadenopathy.  No supraclavicular lymphadenopathy.  Mild enlargement of the right and left axillary lymph node palpable.  I can only appreciate 1 lymph node on each side.  These were mildly tender to palpation. Cardio: regular rate and rhythm, S1S2 heard, no murmurs appreciated Pulm: clear to auscultation bilaterally, no wheezes, rhonchi or rales; normal work of breathing on room air Extremities: warm, well  perfused, No edema, cyanosis or clubbing; +2 pulses bilaterally MSK: Ambulating independently Skin: Annular welts noted along the shoulders bilaterally, 1 lesion on the chest.  Assessment/ Plan:  40 y.o. female   Type 2 diabetes mellitus with diabetic polyneuropathy, with long-term current use of insulin ([place]) - Plan: Lipid Panel, Bayer DCA Hb A1c Waived, CMP14+EGFR, insulin glargine (LANTUS SOLOSTAR) 100 UNIT/ML Solostar Pen, Microalbumin / creatinine urine ratio  Hyperlipidemia associated with type 2 diabetes mellitus ([place]) - Plan: Lipid Panel, CMP14+EGFR, atorvastatin (LIPITOR) 10 MG tablet  Unexplained weight loss  Axillary lymphadenopathy - Plan: HIV antibody (with reflex), Pathologist smear review, DG Chest 2 View, CANCELED: CBC with Differential  Acute swimmer's ear of left side - Plan: CIPRODEX OTIC suspension  Allergic reaction to insect bite - Plan: triamcinolone cream (KENALOG) 0.1 %  I suspect sugar likely not controlled as her previous sugar was not controlled and she has been lost to follow-up with endocrinology.  I am obtaining all labs today.  We discussed that we will need to obtain a foot exam at next visit.  She is due for diabetic eye exam as well.  I have highly encouraged her to follow-up with endocrinology as I suspect that her weight loss is likely due to uncontrolled blood sugars.  We discussed the catabolic effects of uncontrolled sugar  Check nonfasting lipid panel.  Atorvastatin renewed as well  I was able to appreciate mild  enlargement of her lymph nodes in the axillary region.  She is status post treat with prednisone and antibiotics which would have treated a lymphadenitis if there was 1.  No known exposures to cat scratches.  Has never been tested for HIV so I will add this along with a CBC with pathologist smear review.  Chest x-ray also obtained to look for any intrathoracic lesions that may be contributing.  I reviewed the results of her mammogram which did not show etiology of this lymphadenopathy.  Differential diagnoses include lymphadenitis, infection, lymphoma, leukemia.  Left ear consistent with otitis externa.  Ciprodex has been prescribed  Triamcinolone for allergic reaction to insect bite.  Encouraged her to follow-up with me ASAP for full physical exam and recheck of blood sugars, etc.  No orders of the defined types were placed in this encounter.  No orders of the defined types were placed in this encounter.   Tami Norlander, DO [place] [phone]

## 2021-05-08 LAB — LIPID PANEL
Chol/HDL Ratio: 3.3 ratio (ref 0.0–4.4)
Cholesterol, Total: 205 mg/dL — ABNORMAL HIGH (ref 100–199)
HDL: 62 mg/dL (ref >39)
LDL Chol Calc (NIH): 95 mg/dL (ref 0–99)
Triglycerides: 287 mg/dL — ABNORMAL HIGH (ref 0–149)
VLDL Cholesterol Cal: 48 mg/dL — ABNORMAL HIGH (ref 5–40)

## 2021-05-08 LAB — CMP14+EGFR
ALT: 19 IU/L (ref 0–32)
AST: 17 IU/L (ref 0–40)
Albumin/Globulin Ratio: 1.9 (ref 1.2–2.2)
Albumin: 4.4 g/dL (ref 3.8–4.8)
Alkaline Phosphatase: 139 IU/L — ABNORMAL HIGH (ref 44–121)
BUN/Creatinine Ratio: 15 (ref 9–23)
BUN: 9 mg/dL (ref 6–24)
Bilirubin Total: 0.5 mg/dL (ref 0.0–1.2)
CO2: 23 mmol/L (ref 20–29)
Calcium: 9.3 mg/dL (ref 8.7–10.2)
Chloride: 99 mmol/L (ref 96–106)
Creatinine, Ser: 0.62 mg/dL (ref 0.57–1.00)
Globulin, Total: 2.3 g/dL (ref 1.5–4.5)
Glucose: 298 mg/dL — ABNORMAL HIGH (ref 65–99)
Potassium: 3.9 mmol/L (ref 3.5–5.2)
Sodium: 138 mmol/L (ref 134–144)
Total Protein: 6.7 g/dL (ref 6.0–8.5)
eGFR: 115 mL/min/{1.73_m2} (ref >59)

## 2021-05-08 LAB — MICROALBUMIN / CREATININE URINE RATIO
Creatinine, Urine: 58.4 mg/dL
Microalb/Creat Ratio: 11 mg/g creat (ref 0–29)
Microalbumin, Urine: 6.5 ug/mL

## 2021-05-08 LAB — HIV ANTIBODY (ROUTINE TESTING W REFLEX): HIV Screen 4th Generation wRfx: NONREACTIVE

## 2021-05-10 LAB — PATHOLOGIST SMEAR REVIEW
Basophils Absolute: 0.1 10*3/uL (ref 0.0–0.2)
Basos: 1 %
EOS (ABSOLUTE): 0.1 10*3/uL (ref 0.0–0.4)
Eos: 1 %
Hematocrit: 42.5 % (ref 34.0–46.6)
Hemoglobin: 14.2 g/dL (ref 11.1–15.9)
Immature Grans (Abs): 0 10*3/uL (ref 0.0–0.1)
Immature Granulocytes: 0 %
Lymphocytes Absolute: 3.2 10*3/uL — ABNORMAL HIGH (ref 0.7–3.1)
Lymphs: 31 %
MCH: 31.1 pg (ref 26.6–33.0)
MCHC: 33.4 g/dL (ref 31.5–35.7)
MCV: 93 fL (ref 79–97)
Monocytes Absolute: 0.6 10*3/uL (ref 0.1–0.9)
Monocytes: 6 %
Neutrophils Absolute: 6.4 10*3/uL (ref 1.4–7.0)
Neutrophils: 61 %
Platelets: 303 10*3/uL (ref 150–450)
RBC: 4.57 x10E6/uL (ref 3.77–5.28)
RDW: 12.1 % (ref 11.7–15.4)
WBC: 10.3 10*3/uL (ref 3.4–10.8)

## 2021-05-10 NOTE — Progress Notes (Signed)
Returning nurse call.

## 2021-05-19 ENCOUNTER — Telehealth: Payer: Self-pay | Admitting: Family Medicine

## 2021-05-19 NOTE — Telephone Encounter (Signed)
Pt called and aware of all results

## 2021-05-20 ENCOUNTER — Other Ambulatory Visit: Payer: Self-pay | Admitting: Obstetrics & Gynecology

## 2021-06-07 ENCOUNTER — Ambulatory Visit: Payer: Medicaid Other | Admitting: Family Medicine

## 2021-06-08 ENCOUNTER — Encounter: Payer: Self-pay | Admitting: Family Medicine

## 2021-07-06 ENCOUNTER — Other Ambulatory Visit: Payer: Self-pay | Admitting: Obstetrics & Gynecology

## 2021-09-06 DIAGNOSIS — N390 Urinary tract infection, site not specified: Secondary | ICD-10-CM | POA: Diagnosis not present

## 2021-10-05 DIAGNOSIS — G629 Polyneuropathy, unspecified: Secondary | ICD-10-CM | POA: Diagnosis not present

## 2021-10-20 DIAGNOSIS — M25511 Pain in right shoulder: Secondary | ICD-10-CM | POA: Diagnosis not present

## 2021-10-20 DIAGNOSIS — M25521 Pain in right elbow: Secondary | ICD-10-CM | POA: Diagnosis not present

## 2021-11-02 DIAGNOSIS — M47812 Spondylosis without myelopathy or radiculopathy, cervical region: Secondary | ICD-10-CM | POA: Diagnosis not present

## 2021-11-02 DIAGNOSIS — R202 Paresthesia of skin: Secondary | ICD-10-CM | POA: Diagnosis not present

## 2021-11-02 DIAGNOSIS — M25511 Pain in right shoulder: Secondary | ICD-10-CM | POA: Diagnosis not present

## 2021-11-10 ENCOUNTER — Ambulatory Visit: Payer: Medicaid Other | Attending: Physician Assistant | Admitting: Physical Therapy

## 2021-11-10 ENCOUNTER — Encounter: Payer: Self-pay | Admitting: Physical Therapy

## 2021-11-10 ENCOUNTER — Other Ambulatory Visit: Payer: Self-pay

## 2021-11-10 DIAGNOSIS — M25511 Pain in right shoulder: Secondary | ICD-10-CM | POA: Diagnosis not present

## 2021-11-10 DIAGNOSIS — M62838 Other muscle spasm: Secondary | ICD-10-CM | POA: Insufficient documentation

## 2021-11-10 NOTE — Therapy (Signed)
[facility] [address] [place], [place], [address] Phone: [phone]   Fax:  [phone]  Physical Therapy Evaluation  Patient Details  Name: [name] MRN: [id number] Date of Birth: 1981/06/02 Referring Provider (PT): Standley Dakins PA-C   Encounter Date: 11/10/2021   PT End of Session - 11/10/21 0935     Visit Number 1    Number of Visits 8    Date for PT Re-Evaluation 12/08/21    PT Start Time 0904    PT Stop Time 0927    PT Time Calculation (min) 23 min    Activity Tolerance Patient tolerated treatment well    Behavior During Therapy [facility] for tasks assessed/performed             Past Medical History:  Diagnosis Date   Anemia    Anxiety    Constipation    Diabetes mellitus without complication (HCC)    Family history of malignant neoplasm of gastrointestinal tract    Gastroparesis    IBS (irritable bowel syndrome)    constipation   Migraines    Neuropathy due to type 2 diabetes mellitus (HCC)     Past Surgical History:  Procedure Laterality Date   ABDOMINAL HYSTERECTOMY N/A 06/29/2016   Procedure: TOTAL ABDOMINAL HYSTERECTOMY;  Surgeon: Lazaro Arms, MD;  Location: [facility];  Service: Gynecology;  Laterality: N/A;   CESAREAN SECTION     CHOLECYSTECTOMY  2007   EYE SURGERY  1989   SALPINGOOPHORECTOMY Bilateral 06/29/2016   Procedure: BILATERAL SALPINGO OOPHORECTOMY;  Surgeon: Lazaro Arms, MD;  Location: [facility];  Service: Gynecology;  Laterality: Bilateral;   TONSILLECTOMY  1992    There were no vitals filed for this visit.    Subjective Assessment - 11/10/21 0940     Subjective COVID-19 screen performed prior to patient entering clinic.  The patient presents to the clinic today today with c/o right shoulder pain due to two falls because on ice.  This occurred on 09/27/2021 and the pain has not let up.  She recalls about three weeks after the injury that she merely put her right hand on her dresser and  felt intense shooting pain.  She reports that she received an injection last Tuesday (11/02/21) that last about 48 hours.  She rates her pain at an 8/10 today with pain increasing with right UE movement.  She has not found anything really reduces her pain.    Pertinent History DM, neuropathy, latex allergy.    Patient Stated Goals Use right UE without pain.    Currently in Pain? Yes    Pain Location Shoulder    Pain Orientation Left    Pain Descriptors / Indicators Shooting;Throbbing;Sharp;Numbness    Pain Type Acute pain    Pain Onset More than a month ago    Pain Frequency Constant    Aggravating Factors  See above.    Pain Relieving Factors See above.                [facility] PT Assessment - 11/10/21 0001       Assessment   Medical Diagnosis Pain of right shoulder.    Referring Provider (PT) Standley Dakins PA-C    Onset Date/Surgical Date 09/27/21    Hand Dominance Right    Next MD Visit 12/08/2021    Prior Therapy No      Precautions   Precautions None      Restrictions   Weight Bearing Restrictions --   No  right UE weight bearing.     Balance Screen   Has the patient fallen in the past 6 months Yes    How many times? 2.   Slipped and fell on ice.   Has the patient had a decrease in activity level because of a fear of falling?  No    Is the patient reluctant to leave their home because of a fear of falling?  Yes      Home Environment   Living Environment Private residence      Prior Function   Level of Independence Independent      Posture/Postural Control   Posture/Postural Control Postural limitations    Postural Limitations Rounded Shoulders;Forward head      Deep Tendon Reflexes   DTR Assessment Site Biceps;Brachioradialis;Triceps    Biceps DTR 2+    Brachioradialis DTR 2+    Triceps DTR 2+      ROM / Strength   AROM / PROM / Strength AROM;Strength      AROM   Overall AROM Comments Active right shoulder flexion to 150 degrees and painful at endrange,  full ER and behind back to lower lumbar region.      Strength   Overall Strength Comments Right shoulder IR/ER is 4+/5, right elbow flex/ext is normal.  Patient gave an output      Palpation   Palpation comment Patient c/o palpable pain over right UT/Supraspinatus region, diffusely over her posterior cuff/scapular region and middle deltoid.      Special Tests    Special Tests Rotator Cuff Impingement    Rotator Cuff Impingment tests Neer impingement test;Hawkins- Kennedy test      Neer Impingement test    Findings Positive    Side Right      Hawkins-Kennedy test   Findings Positive    Side Right      Ambulation/Gait   Gait Comments WNL.                        Objective measurements completed on examination: See above findings.                     PT Long Term Goals - 11/10/21 1013       PT LONG TERM GOAL #1   Title Independent with a HEP.    Baseline No knowledge of appropriate ther ex.    Time 4    Period Weeks    Status New      PT LONG TERM GOAL #2   Title Sleep undistured.    Baseline Pain awakes patient    Time 4    Period Weeks    Status New      PT LONG TERM GOAL #3   Title Solid right shoulder strength 5/5.    Baseline 4+/5.    Time 4    Period Weeks    Status New      PT LONG TERM GOAL #4   Title Perform ADL's with right shoulder pain not > 2-3/10.    Baseline Performing ADL's can produse near severe pain in patient's right shoulder.    Time 4    Period Weeks    Status New                    Plan - 11/10/21 1006     Clinical Impression Statement The patient presents to OPPT with c/o of right shoulder pain due to falls occurring on  09/27/21.  She had an injection that gave her 48 hours of relief.  She was found to be diffusely tender over her right posterior cuff region, UT/Supraspinatus an dmiddle deltoid region.  Pain wakes her at night and she performed ADL's only in a limited fashion due to hight right  shoulder pain-levels.  She demonstrates positive impingement testing.  Her UE DTR's are normal.  COVID-19 screen performed prior to patient entering clinic.    Personal Factors and Comorbidities Comorbidity 1;Other    Comorbidities DM, neuropathy, latex allergy.    Examination-Activity Limitations Lift;Reach Overhead;Other;Sleep    Examination-Participation Restrictions Cleaning;Other    Stability/Clinical Decision Making Stable/Uncomplicated    Clinical Decision Making Low    Rehab Potential Good    PT Frequency 2x / week    PT Duration 6 weeks    PT Treatment/Interventions ADLs/Self Care Home Management;Therapeutic activities;Therapeutic exercise;Manual techniques;Patient/family education;Passive range of motion    PT Next Visit Plan Soft tisse work, RW4, [facility] ER, full can, scapular exercises.    Consulted and Agree with Plan of Care Patient             Patient will benefit from skilled therapeutic intervention in order to improve the following deficits and impairments:  Decreased activity tolerance, Decreased strength, Pain, Increased muscle spasms, Decreased range of motion  Visit Diagnosis: Acute pain of right shoulder - Plan: PT plan of care cert/re-cert  Other muscle spasm - Plan: PT plan of care cert/re-cert     Problem List Patient Active Problem List   Diagnosis Date Noted   Hyperlipidemia associated with type 2 diabetes mellitus (HCC) 05/07/2021   Small fiber neuropathy 04/24/2020   Migraine without aura and without status migrainosus, not intractable 04/24/2020   Family history of hemophilia A 01/18/2017   Hx of total hysterectomy 06/29/2016   Diabetes (HCC) 06/19/2013   GERD 05/07/2010   ABDOMINAL PAIN, LEFT LOWER QUADRANT 04/09/2010   DEPRESSION 03/08/2010   CONSTIPATION 03/08/2010   HEMORRHAGE OF RECTUM AND ANUS 03/08/2010    Liliana Dang, [place], PT 11/10/2021, 10:16 AM  [facility] [address] [place],  [place], [address] Phone: [phone]   Fax:  [phone]  Name: NIKKO MCFADYEN MRN: [id number] Date of Birth: 03-May-1981

## 2021-11-17 DIAGNOSIS — M47812 Spondylosis without myelopathy or radiculopathy, cervical region: Secondary | ICD-10-CM | POA: Diagnosis not present

## 2021-11-17 DIAGNOSIS — M25511 Pain in right shoulder: Secondary | ICD-10-CM | POA: Diagnosis not present

## 2021-11-22 ENCOUNTER — Ambulatory Visit: Payer: Medicaid Other | Attending: Physician Assistant

## 2021-11-22 ENCOUNTER — Other Ambulatory Visit: Payer: Self-pay

## 2021-11-22 DIAGNOSIS — M62838 Other muscle spasm: Secondary | ICD-10-CM | POA: Diagnosis not present

## 2021-11-22 DIAGNOSIS — M25511 Pain in right shoulder: Secondary | ICD-10-CM | POA: Insufficient documentation

## 2021-11-22 NOTE — Therapy (Signed)
[place] ?[facility] ?[address] ?[place], [place], [address] ?Phone: [phone]   Fax:  [phone] ? ?Physical Therapy Treatment ? ?Patient Details  ?Name: [name] ?MRN: [id number] ?Date of Birth: 1981-02-09 ?Referring Provider (PT): Standley Dakins PA-C ? ? ?Encounter Date: 11/22/2021 ? ? PT End of Session - 11/22/21 0904   ? ? Visit Number 2   ? Number of Visits 8   ? Date for PT Re-Evaluation 12/08/21   ? PT Start Time 0901   ? PT Stop Time 0944   ? PT Time Calculation (min) 43 min   ? Activity Tolerance Patient tolerated treatment well   ? Behavior During Therapy [facility] for tasks assessed/performed   ? ?  ?  ? ?  ? ? ?Past Medical History:  ?Diagnosis Date  ? Anemia   ? Anxiety   ? Constipation   ? Diabetes mellitus without complication (HCC)   ? Family history of malignant neoplasm of gastrointestinal tract   ? Gastroparesis   ? IBS (irritable bowel syndrome)   ? constipation  ? Migraines   ? Neuropathy due to type 2 diabetes mellitus (HCC)   ? ? ?Past Surgical History:  ?Procedure Laterality Date  ? ABDOMINAL HYSTERECTOMY N/A 06/29/2016  ? Procedure: TOTAL ABDOMINAL HYSTERECTOMY;  Surgeon: Lazaro Arms, MD;  Location: [facility];  Service: Gynecology;  Laterality: N/A;  ? CESAREAN SECTION    ? CHOLECYSTECTOMY  2007  ? EYE SURGERY  1989  ? SALPINGOOPHORECTOMY Bilateral 06/29/2016  ? Procedure: BILATERAL SALPINGO OOPHORECTOMY;  Surgeon: Lazaro Arms, MD;  Location: [facility];  Service: Gynecology;  Laterality: Bilateral;  ? TONSILLECTOMY  1992  ? ? ?There were no vitals filed for this visit. ? ? Subjective Assessment - 11/22/21 0903   ? ? Subjective COVID-19 screen performed prior to patient entering clinic.  Pt arrives for today's treatment session reporting 6/10 right shoulder.  Pt states that MRI is scheduled for 12/05/21.   ? Pertinent History DM, neuropathy, latex allergy.   ? Patient Stated Goals Use right UE without pain.   ? Currently in Pain? Yes   ? Pain Score 6    ?  Pain Location Shoulder   ? Pain Orientation Right   ? Pain Onset More than a month ago   ? ?  ?  ? ?  ? ? ? ? ? ? ? ? ? ? ? ? ? ? ? ? ? ? ? ? [facility] Adult PT Treatment/Exercise - 11/22/21 0001   ? ?  ? Exercises  ? Exercises Shoulder   ?  ? Shoulder Exercises: Seated  ? Extension Strengthening;Both;15 reps;Theraband   ? Theraband Level (Shoulder Extension) Level 2 (Red)   ? Row Strengthening;Both;15 reps;Theraband   ? Theraband Level (Shoulder Row) Level 2 (Red)   ? Protraction Strengthening;Both;15 reps;Theraband   ? Theraband Level (Shoulder Protraction) Level 2 (Red)   ? Horizontal ABduction Strengthening;Both;15 reps;Theraband   ? Theraband Level (Shoulder Horizontal ABduction) Level 2 (Red)   ? External Rotation Strengthening;Both;15 reps;Theraband   ? Internal Rotation Right;15 reps;Theraband   ? Theraband Level (Shoulder Internal Rotation) Level 2 (Red)   ? Other Seated Exercises shoulder shrugs, scap retraction, shoulder circles  x 15 reps   ?  ? Manual Therapy  ? Manual Therapy Soft tissue mobilization   ? Soft tissue mobilization STW/M to right rotator cuff musculature to decrease pain and tone, noted tone throughout   ? ?  ?  ? ?  ? ? ? ? ? ? ? ? ? ? ? ? ? ? ?  PT Long Term Goals - 11/10/21 1013   ? ?  ? PT LONG TERM GOAL #1  ? Title Independent with a HEP.   ? Baseline No knowledge of appropriate ther ex.   ? Time 4   ? Period Weeks   ? Status New   ?  ? PT LONG TERM GOAL #2  ? Title Sleep undistured.   ? Baseline Pain awakes patient   ? Time 4   ? Period Weeks   ? Status New   ?  ? PT LONG TERM GOAL #3  ? Title Solid right shoulder strength 5/5.   ? Baseline 4+/5.   ? Time 4   ? Period Weeks   ? Status New   ?  ? PT LONG TERM GOAL #4  ? Title Perform ADL's with right shoulder pain not > 2-3/10.   ? Baseline Performing ADL's can produse near severe pain in patient's right shoulder.   ? Time 4   ? Period Weeks   ? Status New   ? ?  ?  ? ?  ? ? ? ? ? ? ? ? Plan - 11/22/21 0905   ? ? Clinical Impression  Statement Pt arrives for today's treatment session reporting 6/10 right shoulder pain.  Pt states that she went to the MD after eval and has scheduled MRI for 3/19 with follow up on 3/28 to get results.  Pt instructed in various seated resisted and unresisted shoulder exercises to increase strength and function.  Pt requiring min cues for proper technique and posture.  Pt also requiring cues to remain within painfree ROM.  STW/M performed to right rotator cuff musulature to decrease pain and tone with noted tone and pain with palpation throughout.  Pt reported 6/10 right shoulder pain at completion of today's treatment session.   ? Personal Factors and Comorbidities Comorbidity 1;Other   ? Comorbidities DM, neuropathy, latex allergy.   ? Examination-Activity Limitations Lift;Reach Overhead;Other;Sleep   ? Examination-Participation Restrictions Cleaning;Other   ? Stability/Clinical Decision Making Stable/Uncomplicated   ? Rehab Potential Good   ? PT Frequency 2x / week   ? PT Duration 6 weeks   ? PT Treatment/Interventions ADLs/Self Care Home Management;Therapeutic activities;Therapeutic exercise;Manual techniques;Patient/family education;Passive range of motion   ? PT Next Visit Plan Soft tisse work, RW4, [facility] ER, full can, scapular exercises.   ? Consulted and Agree with Plan of Care Patient   ? ?  ?  ? ?  ? ? ?Patient will benefit from skilled therapeutic intervention in order to improve the following deficits and impairments:  Decreased activity tolerance, Decreased strength, Pain, Increased muscle spasms, Decreased range of motion ? ?Visit Diagnosis: ?Acute pain of right shoulder ? ?Other muscle spasm ? ? ? ? ?Problem List ?Patient Active Problem List  ? Diagnosis Date Noted  ? Hyperlipidemia associated with type 2 diabetes mellitus (HCC) 05/07/2021  ? Small fiber neuropathy 04/24/2020  ? Migraine without aura and without status migrainosus, not intractable 04/24/2020  ? Family history of hemophilia A 01/18/2017   ? Hx of total hysterectomy 06/29/2016  ? Diabetes (HCC) 06/19/2013  ? GERD 05/07/2010  ? ABDOMINAL PAIN, LEFT LOWER QUADRANT 04/09/2010  ? DEPRESSION 03/08/2010  ? CONSTIPATION 03/08/2010  ? HEMORRHAGE OF RECTUM AND ANUS 03/08/2010  ? ? ?Newman Pies, PTA ?11/22/2021, 9:48 AM ? ?[place] ?[facility] ?[address] ?[place], [place], [address] ?Phone: [phone]   Fax:  [phone] ? ?Name: [name] ?MRN: [id number] ?Date  of Birth: 28-Aug-1981 ? ? ? ?

## 2021-11-25 ENCOUNTER — Ambulatory Visit: Payer: Medicaid Other

## 2021-11-25 ENCOUNTER — Other Ambulatory Visit: Payer: Self-pay

## 2021-11-25 DIAGNOSIS — M62838 Other muscle spasm: Secondary | ICD-10-CM | POA: Diagnosis not present

## 2021-11-25 DIAGNOSIS — M25511 Pain in right shoulder: Secondary | ICD-10-CM

## 2021-11-25 NOTE — Therapy (Signed)
[place] ?[facility] ?[address] ?[place], [place], [address] ?Phone: [phone]   Fax:  [phone] ? ?Physical Therapy Treatment ? ?Patient Details  ?Name: [name] ?MRN: [id number] ?Date of Birth: [birth year] ?Referring Provider (PT): Standley Dakins PA-C ? ? ?Encounter Date: 11/25/2021 ? ? PT End of Session - 11/25/21 0905   ? ? Visit Number 3   ? Number of Visits 8   ? Date for PT Re-Evaluation 12/08/21   ? PT Start Time 0900   ? PT Stop Time [phone]   ? PT Time Calculation (min) 56 min   ? Activity Tolerance Patient tolerated treatment well   ? Behavior During Therapy [facility] for tasks assessed/performed   ? ?  ?  ? ?  ? ? ?Past Medical History:  ?Diagnosis Date  ? Anemia   ? Anxiety   ? Constipation   ? Diabetes mellitus without complication (HCC)   ? Family history of malignant neoplasm of gastrointestinal tract   ? Gastroparesis   ? IBS (irritable bowel syndrome)   ? constipation  ? Migraines   ? Neuropathy due to type 2 diabetes mellitus (HCC)   ? ? ?Past Surgical History:  ?Procedure Laterality Date  ? ABDOMINAL HYSTERECTOMY N/A 06/29/2016  ? Procedure: TOTAL ABDOMINAL HYSTERECTOMY;  Surgeon: Lazaro Arms, MD;  Location: [facility];  Service: Gynecology;  Laterality: N/A;  ? CESAREAN SECTION    ? CHOLECYSTECTOMY  2007  ? EYE SURGERY  1989  ? SALPINGOOPHORECTOMY Bilateral 06/29/2016  ? Procedure: BILATERAL SALPINGO OOPHORECTOMY;  Surgeon: Lazaro Arms, MD;  Location: [facility];  Service: Gynecology;  Laterality: Bilateral;  ? TONSILLECTOMY  1992  ? ? ?There were no vitals filed for this visit. ? ? Subjective Assessment - 11/25/21 0904   ? ? Subjective COVID-19 screen performed prior to patient entering clinic.  Pt arrives for today's treatment session reporting 8/10 right shoulder. After Monday's treatment pain was 4-5/10 until Wednesday morning.   ? Pertinent History DM, neuropathy, latex allergy.   ? Patient Stated Goals Use right UE without pain.   ? Currently in Pain? Yes   ?  Pain Score 8    ? Pain Location Shoulder   ? Pain Orientation Right   ? Pain Onset More than a month ago   ? ?  ?  ? ?  ? ? ? ? ? ? ? ? ? ? ? ? ? ? ? ? ? ? ? ? [facility] Adult PT Treatment/Exercise - 11/25/21 0001   ? ?  ? Shoulder Exercises: Seated  ? Extension Strengthening;Both;15 reps;Theraband   ? Theraband Level (Shoulder Extension) Level 2 (Red)   ? Row Strengthening;Both;15 reps;Theraband   ? Theraband Level (Shoulder Row) Level 2 (Red)   ? Protraction Strengthening;Both;15 reps;Theraband   ? Theraband Level (Shoulder Protraction) Level 2 (Red)   ? Horizontal ABduction Other (comment)   too painful  ? External Rotation Other (comment)   too painful  ? Other Seated Exercises shoulder shrugs, scap retraction, shoulder circles  x 15 reps   ?  ? Manual Therapy  ? Manual Therapy Soft tissue mobilization;Joint mobilization   ? Joint Mobilization Scapular mobs to tolerance   ? Soft tissue mobilization STW/M to right rotator cuff musculature to decrease pain and tone, noted tone throughout   ? ?  ?  ? ?  ? ? ? ? ? ? ? ? ? ? ? ? ? ? ? PT Long Term Goals - 11/10/21 1013   ? ?  ?  PT LONG TERM GOAL #1  ? Title Independent with a HEP.   ? Baseline No knowledge of appropriate ther ex.   ? Time 4   ? Period Weeks   ? Status New   ?  ? PT LONG TERM GOAL #2  ? Title Sleep undistured.   ? Baseline Pain awakes patient   ? Time 4   ? Period Weeks   ? Status New   ?  ? PT LONG TERM GOAL #3  ? Title Solid right shoulder strength 5/5.   ? Baseline 4+/5.   ? Time 4   ? Period Weeks   ? Status New   ?  ? PT LONG TERM GOAL #4  ? Title Perform ADL's with right shoulder pain not > 2-3/10.   ? Baseline Performing ADL's can produse near severe pain in patient's right shoulder.   ? Time 4   ? Period Weeks   ? Status New   ? ?  ?  ? ?  ? ? ? ? ? ? ? ? Plan - 11/25/21 0905   ? ? Clinical Impression Statement Pt arrives for today's treatment session reporting 8/10 right shoulder pain.  Pt states that her shoulder felt "much better" after Monday's  treatment until Wednesday morning.  Pt unable to identify cause of increased pain.  Pt unable to perform seated horizontal abduction, external and internal rotaiton due to max increased pain.  STW/M performed to right paraspinals, upper trap, and rotator cuff musculature to decrease pain and tone with noted tenderness throughout upper trap.  Scapular mobs performed to tolerance in all directions.  All manual therapy performed with patient in left side-lying.  Pt reported 6/10 right shoulder pain at completion of today's treatment session.   ? Personal Factors and Comorbidities Comorbidity 1;Other   ? Comorbidities DM, neuropathy, latex allergy.   ? Examination-Activity Limitations Lift;Reach Overhead;Other;Sleep   ? Examination-Participation Restrictions Cleaning;Other   ? Stability/Clinical Decision Making Stable/Uncomplicated   ? Rehab Potential Good   ? PT Frequency 2x / week   ? PT Duration 6 weeks   ? PT Treatment/Interventions ADLs/Self Care Home Management;Therapeutic activities;Therapeutic exercise;Manual techniques;Patient/family education;Passive range of motion   ? PT Next Visit Plan Soft tisse work, RW4, [facility] ER, full can, scapular exercises.   ? Consulted and Agree with Plan of Care Patient   ? ?  ?  ? ?  ? ? ?Patient will benefit from skilled therapeutic intervention in order to improve the following deficits and impairments:  Decreased activity tolerance, Decreased strength, Pain, Increased muscle spasms, Decreased range of motion ? ?Visit Diagnosis: ?Acute pain of right shoulder ? ?Other muscle spasm ? ? ? ? ?Problem List ?Patient Active Problem List  ? Diagnosis Date Noted  ? Hyperlipidemia associated with type 2 diabetes mellitus (HCC) 05/07/2021  ? Small fiber neuropathy 04/24/2020  ? Migraine without aura and without status migrainosus, not intractable 04/24/2020  ? Family history of hemophilia A 01/18/2017  ? Hx of total hysterectomy 06/29/2016  ? Diabetes (HCC) 06/19/2013  ? GERD 05/07/2010  ?  ABDOMINAL PAIN, LEFT LOWER QUADRANT 04/09/2010  ? DEPRESSION 03/08/2010  ? CONSTIPATION 03/08/2010  ? HEMORRHAGE OF RECTUM AND ANUS 03/08/2010  ? ? ?Newman Pies, PTA ?11/25/2021, 10:09 AM ? ? ?[facility] ?[address] ?[place], [place], [address] ?Phone: [phone]   Fax:  [phone] ? ?Name: TRISTY UDOVICH ?MRN: [id number] ?Date of Birth: 1981-07-22 ? ? ? ?

## 2021-11-30 ENCOUNTER — Ambulatory Visit: Payer: Medicaid Other | Admitting: *Deleted

## 2021-11-30 ENCOUNTER — Other Ambulatory Visit: Payer: Self-pay

## 2021-11-30 DIAGNOSIS — M25511 Pain in right shoulder: Secondary | ICD-10-CM

## 2021-11-30 DIAGNOSIS — M62838 Other muscle spasm: Secondary | ICD-10-CM

## 2021-11-30 NOTE — Therapy (Signed)
[place] ?[facility] ?[address] ?[place], [place], [address] ?Phone: [phone]   Fax:  [phone] ? ?Physical Therapy Treatment ? ?Patient Details  ?Name: [name] ?MRN: [id number] ?Date of Birth: 12-Jan-1981 ?Referring Provider (PT): Shelle Iron PA-C ? ? ?Encounter Date: 11/30/2021 ? ? PT End of Session - 11/30/21 1042   ? ? Visit Number 4   ? Number of Visits 8   ? Date for PT Re-Evaluation 12/08/21   ? PT Start Time 0900   ? PT Stop Time 0947   ? PT Time Calculation (min) 47 min   ? ?  ?  ? ?  ? ? ?Past Medical History:  ?Diagnosis Date  ? Anemia   ? Anxiety   ? Constipation   ? Diabetes mellitus without complication ([place])   ? Family history of malignant neoplasm of gastrointestinal tract   ? Gastroparesis   ? IBS (irritable bowel syndrome)   ? constipation  ? Migraines   ? Neuropathy due to type 2 diabetes mellitus ([place])   ? ? ?Past Surgical History:  ?Procedure Laterality Date  ? ABDOMINAL HYSTERECTOMY N/A 06/29/2016  ? Procedure: TOTAL ABDOMINAL HYSTERECTOMY;  Surgeon: Florian Buff, MD;  Location: [facility];  Service: Gynecology;  Laterality: N/A;  ? CESAREAN SECTION    ? CHOLECYSTECTOMY  2007  ? EYE SURGERY  1989  ? SALPINGOOPHORECTOMY Bilateral 06/29/2016  ? Procedure: BILATERAL SALPINGO OOPHORECTOMY;  Surgeon: Florian Buff, MD;  Location: [facility];  Service: Gynecology;  Laterality: Bilateral;  ? TONSILLECTOMY  1992  ? ? ?There were no vitals filed for this visit. ? ? Subjective Assessment - 11/30/21 0904   ? ? Subjective COVID-19 screen performed prior to patient entering clinic.  Pt arrives for today's treatment session reporting 5-6/10 right shoulder.  MRI on Sunday   ? Pertinent History DM, neuropathy, latex allergy.   ? Patient Stated Goals Use right UE without pain.   ? Currently in Pain? Yes   ? Pain Score 6    ? Pain Location Shoulder   ? Pain Descriptors / Indicators Shooting   ? Pain Onset More than a month ago   ? ?  ?  ? ?   ? ? ? ? ? ? ? ? ? ? ? ? ? ? ? ? ? ? ? ? [facility] Adult PT Treatment/Exercise - 11/30/21 0001   ? ?  ? Exercises  ? Exercises Shoulder   ?  ? Shoulder Exercises: Sidelying  ? External Rotation Right;20 reps;AROM   ? Flexion AROM   Attempted x 5 increased pain  ? ABduction AROM;5 reps   Increased pain  ?  ? Shoulder Exercises: Standing  ? External Rotation Strengthening;Right;20 reps;10 reps;Theraband   ? Theraband Level (Shoulder External Rotation) Level 1 (Yellow)   ? Internal Rotation Strengthening;Right;10 reps;20 reps;Theraband   ? Theraband Level (Shoulder Internal Rotation) Level 1 (Yellow)   ?  ? Manual Therapy  ? Manual Therapy Soft tissue mobilization;Passive ROM   ? Soft tissue mobilization STW/M to right posterior cuff musculature and TPR to RT UTto decrease pain and tone, noted tone throughout   ? Passive ROM PROM for ER/IR and flexion performed. flexion only to 100 degrees due to pain   ? ?  ?  ? ?  ? ? ? ? ? ? ? ? ? ? ? ? ? ? ? PT Long Term Goals - 11/10/21 1013   ? ?  ? PT LONG TERM GOAL #1  ?  Title Independent with a HEP.   ? Baseline No knowledge of appropriate ther ex.   ? Time 4   ? Period Weeks   ? Status New   ?  ? PT LONG TERM GOAL #2  ? Title Sleep undistured.   ? Baseline Pain awakes patient   ? Time 4   ? Period Weeks   ? Status New   ?  ? PT LONG TERM GOAL #3  ? Title Solid right shoulder strength 5/5.   ? Baseline 4+/5.   ? Time 4   ? Period Weeks   ? Status New   ?  ? PT LONG TERM GOAL #4  ? Title Perform ADL's with right shoulder pain not > 2-3/10.   ? Baseline Performing ADL's can produse near severe pain in patient's right shoulder.   ? Time 4   ? Period Weeks   ? Status New   ? ?  ?  ? ?  ? ? ? ? ? ? ? ? Plan - 11/30/21 0906   ? ? Clinical Impression Statement Pt arrived today reporting f/u with MD and she will have an MRI this Sunday for RT shldr. She was able to perform ER/IR yellow tubing fairly well with minimal pain increase. Sidelying AROM exs also performed. She did well with ER,  but abduction and flexion were fairly painful and unable to perform more than 5 reps due to pain. STW was performed to posterior cuff and UT with notable TP's throughout.   ? Personal Factors and Comorbidities Comorbidity 1;Other   ? Comorbidities DM, neuropathy, latex allergy.   ? Examination-Activity Limitations Lift;Reach Overhead;Other;Sleep   ? Examination-Participation Restrictions Cleaning;Other   ? Stability/Clinical Decision Making Stable/Uncomplicated   ? Rehab Potential Good   ? PT Frequency 2x / week   ? PT Duration 6 weeks   ? PT Treatment/Interventions ADLs/Self Care Home Management;Therapeutic activities;Therapeutic exercise;Manual techniques;Patient/family education;Passive range of motion   ? PT Next Visit Plan Soft tisse work, RW4, [facility] ER, full can, scapular exercises.   ? Consulted and Agree with Plan of Care Patient   ? ?  ?  ? ?  ? ? ?Patient will benefit from skilled therapeutic intervention in order to improve the following deficits and impairments:  Decreased activity tolerance, Decreased strength, Pain, Increased muscle spasms, Decreased range of motion ? ?Visit Diagnosis: ?Acute pain of right shoulder ? ?Other muscle spasm ? ? ? ? ?Problem List ?Patient Active Problem List  ? Diagnosis Date Noted  ? Hyperlipidemia associated with type 2 diabetes mellitus ([place]) 05/07/2021  ? Small fiber neuropathy 04/24/2020  ? Migraine without aura and without status migrainosus, not intractable 04/24/2020  ? Family history of hemophilia A 01/18/2017  ? Hx of total hysterectomy 06/29/2016  ? Diabetes ([place]) 06/19/2013  ? GERD 05/07/2010  ? ABDOMINAL PAIN, LEFT LOWER QUADRANT 04/09/2010  ? DEPRESSION 03/08/2010  ? CONSTIPATION 03/08/2010  ? HEMORRHAGE OF RECTUM AND ANUS 03/08/2010  ? ? ?Lyndzee Kliebert,CHRIS, PTA ?11/30/2021, 11:00 AM ? ?[place] ?[facility] ?[address] ?[place], [place], [address] ?Phone: [phone]   Fax:  [phone] ? ?Name: LESLEYANNE RIBBECK ?MRN:  [id number] ?Date of Birth: [birth year] ? ? ? ?

## 2021-12-03 ENCOUNTER — Ambulatory Visit: Payer: Medicaid Other | Admitting: Physical Therapy

## 2021-12-03 ENCOUNTER — Other Ambulatory Visit: Payer: Self-pay

## 2021-12-03 ENCOUNTER — Encounter: Payer: Self-pay | Admitting: Physical Therapy

## 2021-12-03 DIAGNOSIS — M62838 Other muscle spasm: Secondary | ICD-10-CM | POA: Diagnosis not present

## 2021-12-03 DIAGNOSIS — M25511 Pain in right shoulder: Secondary | ICD-10-CM | POA: Diagnosis not present

## 2021-12-03 NOTE — Therapy (Signed)
?[facility] ?[address] ?[place], [place], [address] ?Phone: [phone]   Fax:  [phone] ? ?Physical Therapy Treatment ? ?Patient Details  ?Name: [name] ?MRN: [id number] ?Date of Birth: 07-07-1981 ?Referring Provider (PT): Standley Dakins PA-C ? ? ?Encounter Date: 12/03/2021 ? ? PT End of Session - 12/03/21 1053   ? ? Visit Number 5   ? Number of Visits 8   ? Date for PT Re-Evaluation 12/08/21   ? PT Start Time [phone]   ? PT Stop Time 0934   ? PT Time Calculation (min) 29 min   ? Activity Tolerance Patient tolerated treatment well   ? Behavior During Therapy [facility] for tasks assessed/performed   ? ?  ?  ? ?  ? ? ?Past Medical History:  ?Diagnosis Date  ? Anemia   ? Anxiety   ? Constipation   ? Diabetes mellitus without complication (HCC)   ? Family history of malignant neoplasm of gastrointestinal tract   ? Gastroparesis   ? IBS (irritable bowel syndrome)   ? constipation  ? Migraines   ? Neuropathy due to type 2 diabetes mellitus (HCC)   ? ? ?Past Surgical History:  ?Procedure Laterality Date  ? ABDOMINAL HYSTERECTOMY N/A 06/29/2016  ? Procedure: TOTAL ABDOMINAL HYSTERECTOMY;  Surgeon: Lazaro Arms, MD;  Location: [facility];  Service: Gynecology;  Laterality: N/A;  ? CESAREAN SECTION    ? CHOLECYSTECTOMY  2007  ? EYE SURGERY  1989  ? SALPINGOOPHORECTOMY Bilateral 06/29/2016  ? Procedure: BILATERAL SALPINGO OOPHORECTOMY;  Surgeon: Lazaro Arms, MD;  Location: [facility];  Service: Gynecology;  Laterality: Bilateral;  ? TONSILLECTOMY  1992  ? ? ?There were no vitals filed for this visit. ? ? Subjective Assessment - 12/03/21 1026   ? ? Subjective Pain about a 5 today.  Getting my MRI Sunday.   ? Pertinent History DM, neuropathy, latex allergy.   ? Patient Stated Goals Use right UE without pain.   ? Currently in Pain? Yes   ? Pain Score 5    ? Pain Location Shoulder   ? Pain Orientation Right   ? Pain Descriptors / Indicators Shooting   ? Pain Type Acute pain   ? ?  ?  ? ?   ? ? ? ? ? ? ? ? ? ? ? ? ? ? ? ? ? ? ? ? [facility] Adult PT Treatment/Exercise - 12/03/21 0001   ? ?  ? Exercises  ? Exercises Shoulder   ?  ? Shoulder Exercises: Standing  ? Other Standing Exercises UE Ranger on wall x 4 minutes.   ?  ? Shoulder Exercises: Pulleys  ? Flexion Limitations 4 minutes.   ?  ? Shoulder Exercises: ROM/Strengthening  ? UBE (Upper Arm Bike) 8 minutes.   ?  ? Manual Therapy  ? Manual Therapy Soft tissue mobilization   ? Soft tissue mobilization STW/M x 8 minutes to patient's right UT/Supraspinatus region and posterior cuff with ischemic release technique utilized.   ? ?  ?  ? ?  ? ? ? ? ? ? ? ? ? ? ? ? ? ? ? PT Long Term Goals - 11/10/21 1013   ? ?  ? PT LONG TERM GOAL #1  ? Title Independent with a HEP.   ? Baseline No knowledge of appropriate ther ex.   ? Time 4   ? Period Weeks   ? Status New   ?  ? PT LONG TERM GOAL #  2  ? Title Sleep undistured.   ? Baseline Pain awakes patient   ? Time 4   ? Period Weeks   ? Status New   ?  ? PT LONG TERM GOAL #3  ? Title Solid right shoulder strength 5/5.   ? Baseline 4+/5.   ? Time 4   ? Period Weeks   ? Status New   ?  ? PT LONG TERM GOAL #4  ? Title Perform ADL's with right shoulder pain not > 2-3/10.   ? Baseline Performing ADL's can produse near severe pain in patient's right shoulder.   ? Time 4   ? Period Weeks   ? Status New   ? ?  ?  ? ?  ? ? ? ? ? ? ? ? Plan - 12/03/21 1054   ? ? Clinical Impression Statement Patient with continued right shoulder pain.  She is getting an MRI Sunday.  She did okay with ther ex today.  Continued palpable pain over right UT/Supraspinatus and posterior cuff.  Reviewed yellow theraband IR/ER.   ? Personal Factors and Comorbidities Comorbidity 1;Other   ? Comorbidities DM, neuropathy, latex allergy.   ? Examination-Participation Restrictions Cleaning;Other   ? Stability/Clinical Decision Making Stable/Uncomplicated   ? Rehab Potential Good   ? PT Frequency 2x / week   ? PT Duration 6 weeks   ? PT Treatment/Interventions  ADLs/Self Care Home Management;Therapeutic activities;Therapeutic exercise;Manual techniques;Patient/family education;Passive range of motion   ? PT Next Visit Plan Soft tisse work, RW4, [facility] ER, full can, scapular exercises.   ? Consulted and Agree with Plan of Care Patient   ? ?  ?  ? ?  ? ? ?Patient will benefit from skilled therapeutic intervention in order to improve the following deficits and impairments:  Decreased activity tolerance, Decreased strength, Pain, Increased muscle spasms, Decreased range of motion ? ?Visit Diagnosis: ?Acute pain of right shoulder ? ?Other muscle spasm ? ? ? ? ?Problem List ?Patient Active Problem List  ? Diagnosis Date Noted  ? Hyperlipidemia associated with type 2 diabetes mellitus (HCC) 05/07/2021  ? Small fiber neuropathy 04/24/2020  ? Migraine without aura and without status migrainosus, not intractable 04/24/2020  ? Family history of hemophilia A 01/18/2017  ? Hx of total hysterectomy 06/29/2016  ? Diabetes (HCC) 06/19/2013  ? GERD 05/07/2010  ? ABDOMINAL PAIN, LEFT LOWER QUADRANT 04/09/2010  ? DEPRESSION 03/08/2010  ? CONSTIPATION 03/08/2010  ? HEMORRHAGE OF RECTUM AND ANUS 03/08/2010  ? ? ?Kasir Hallenbeck, [place], PT ?12/03/2021, 10:57 AM ? ?[place] ?[facility] ?[address] ?[place], [place], [address] ?Phone: [phone]   Fax:  [phone] ? ?Name: [name] ?MRN: [id number] ?Date of Birth: Oct 29, 1980 ? ? ? ?

## 2021-12-05 DIAGNOSIS — M25511 Pain in right shoulder: Secondary | ICD-10-CM | POA: Diagnosis not present

## 2021-12-06 ENCOUNTER — Ambulatory Visit: Payer: Medicaid Other | Admitting: Physical Therapy

## 2021-12-06 ENCOUNTER — Other Ambulatory Visit: Payer: Self-pay

## 2021-12-06 DIAGNOSIS — M62838 Other muscle spasm: Secondary | ICD-10-CM

## 2021-12-06 DIAGNOSIS — M25511 Pain in right shoulder: Secondary | ICD-10-CM | POA: Diagnosis not present

## 2021-12-06 NOTE — Therapy (Signed)
[place] ?[facility] ?[address] ?[place], [place], [address] ?Phone: [phone]   Fax:  [phone] ? ?Physical Therapy Treatment ? ?Patient Details  ?Name: [name] ?MRN: [id number] ?Date of Birth: Jan 01, 1981 ?Referring Provider (PT): Shelle Iron PA-C ? ? ?Encounter Date: 12/06/2021 ? ? PT End of Session - 12/06/21 0943   ? ? Visit Number 6   ? Number of Visits 8   ? Date for PT Re-Evaluation 12/08/21   ? PT Start Time 0900   ? PT Stop Time 0934   ? PT Time Calculation (min) 34 min   ? Activity Tolerance Patient tolerated treatment well   ? Behavior During Therapy [facility] for tasks assessed/performed   ? ?  ?  ? ?  ? ? ?Past Medical History:  ?Diagnosis Date  ? Anemia   ? Anxiety   ? Constipation   ? Diabetes mellitus without complication ([place])   ? Family history of malignant neoplasm of gastrointestinal tract   ? Gastroparesis   ? IBS (irritable bowel syndrome)   ? constipation  ? Migraines   ? Neuropathy due to type 2 diabetes mellitus ([place])   ? ? ?Past Surgical History:  ?Procedure Laterality Date  ? ABDOMINAL HYSTERECTOMY N/A 06/29/2016  ? Procedure: TOTAL ABDOMINAL HYSTERECTOMY;  Surgeon: Florian Buff, MD;  Location: [facility];  Service: Gynecology;  Laterality: N/A;  ? CESAREAN SECTION    ? CHOLECYSTECTOMY  2007  ? EYE SURGERY  1989  ? SALPINGOOPHORECTOMY Bilateral 06/29/2016  ? Procedure: BILATERAL SALPINGO OOPHORECTOMY;  Surgeon: Florian Buff, MD;  Location: [facility];  Service: Gynecology;  Laterality: Bilateral;  ? TONSILLECTOMY  1992  ? ? ?There were no vitals filed for this visit. ? ? Subjective Assessment - 12/06/21 0937   ? ? Subjective Hurting a lot Saturday due to doing housework Friday.   ? Pertinent History DM, neuropathy, latex allergy.   ? Currently in Pain? Yes   ? Pain Score 6    ? Pain Location Shoulder   ? Pain Orientation Right   ? Pain Descriptors / Indicators Shooting   ? Pain Type Acute pain   ? Pain Onset More than a month ago   ? ?  ?  ? ?   ? ? ? ? ? ? ? ? ? ? ? ? ? ? ? ? ? ? ? ? [place] Adult PT Treatment/Exercise - 12/06/21 0001   ? ?  ? Exercises  ? Exercises Shoulder   ?  ? Shoulder Exercises: Standing  ? Other Standing Exercises 1# right full can to fatigue x 2 and yellow theraband ER to fatogue.   ?  ? Shoulder Exercises: Pulleys  ? Flexion Limitations 5 minutes.   ? Other Pulley Exercises UE Ranger on wall x 3 minutes.   ?  ? Shoulder Exercises: ROM/Strengthening  ? UBE (Upper Arm Bike) 8 minutes at 120 RPM's.   ?  ? Manual Therapy  ? Manual Therapy Soft tissue mobilization   ? Soft tissue mobilization STW/M x 10 minutes to patient left UT/Supraspinatus and posterior cuff musculature with ischemic releas etechnique utilized.   ? ?  ?  ? ?  ? ? ? ? ? ? ? ? ? ? ? ? ? ? ? PT Long Term Goals - 11/10/21 1013   ? ?  ? PT LONG TERM GOAL #1  ? Title Independent with a HEP.   ? Baseline No knowledge of appropriate ther ex.   ?  Time 4   ? Period Weeks   ? Status New   ?  ? PT LONG TERM GOAL #2  ? Title Sleep undistured.   ? Baseline Pain awakes patient   ? Time 4   ? Period Weeks   ? Status New   ?  ? PT LONG TERM GOAL #3  ? Title Solid right shoulder strength 5/5.   ? Baseline 4+/5.   ? Time 4   ? Period Weeks   ? Status New   ?  ? PT LONG TERM GOAL #4  ? Title Perform ADL's with right shoulder pain not > 2-3/10.   ? Baseline Performing ADL's can produse near severe pain in patient's right shoulder.   ? Time 4   ? Period Weeks   ? Status New   ? ?  ?  ? ?  ? ? ? ? ? ? ? ? Plan - 12/06/21 0940   ? ? Clinical Impression Statement [name] did well today with ther ex.  [name].  She continued to have increased tone over her right UT/Supraspinatus and posterior cuff musculature.  She responmded well to STW/M.   ? ?  ?  ? ?  ? ? ?Patient will benefit from skilled therapeutic intervention in order to improve the following deficits and impairments:    ? ?Visit Diagnosis: ?Acute pain of right shoulder ? ?Other muscle  spasm ? ? ? ? ?Problem List ?Patient Active Problem List  ? Diagnosis Date Noted  ? Hyperlipidemia associated with type 2 diabetes mellitus ([place]) 05/07/2021  ? Small fiber neuropathy 04/24/2020  ? Migraine without aura and without status migrainosus, not intractable 04/24/2020  ? Family history of hemophilia A 01/18/2017  ? Hx of total hysterectomy 06/29/2016  ? Diabetes ([place]) 06/19/2013  ? GERD 05/07/2010  ? ABDOMINAL PAIN, LEFT LOWER QUADRANT 04/09/2010  ? DEPRESSION 03/08/2010  ? CONSTIPATION 03/08/2010  ? HEMORRHAGE OF RECTUM AND ANUS 03/08/2010  ? ? ?Mallery Harshman, [place], PT ?12/06/2021, 9:45 AM ? ?[place] ?[facility] ?[address] ?[place], [place], [address] ?Phone: [phone]   Fax:  [phone] ? ?Name: [name] ?MRN: [id number] ?Date of Birth: 09-Aug-1981 ? ? ? ?

## 2021-12-09 ENCOUNTER — Ambulatory Visit: Payer: Medicaid Other | Admitting: Physical Therapy

## 2021-12-09 ENCOUNTER — Other Ambulatory Visit: Payer: Self-pay

## 2021-12-09 DIAGNOSIS — M62838 Other muscle spasm: Secondary | ICD-10-CM | POA: Diagnosis not present

## 2021-12-09 DIAGNOSIS — M25511 Pain in right shoulder: Secondary | ICD-10-CM

## 2021-12-09 NOTE — Therapy (Signed)
[place] ?[facility] ?[address] ?[place], [place], [address] ?Phone: [phone]   Fax:  [phone] ? ?Physical Therapy Treatment ? ?Patient Details  ?Name: [name] ?MRN: [id number] ?Date of Birth: 1981/05/23 ?Referring Provider (PT): Standley Dakins PA-C ? ? ?Encounter Date: 12/09/2021 ? ? PT End of Session - 12/09/21 1024   ? ? Visit Number 7   ? Number of Visits 8   ? Date for PT Re-Evaluation 12/08/21   ? PT Start Time 0900   ? PT Stop Time [phone]   ? PT Time Calculation (min) 38 min   ? Activity Tolerance Patient tolerated treatment well   ? Behavior During Therapy [name] for tasks assessed/performed   ? ?  ?  ? ?  ? ? ?Past Medical History:  ?Diagnosis Date  ? Anemia   ? Anxiety   ? Constipation   ? Diabetes mellitus without complication (HCC)   ? Family history of malignant neoplasm of gastrointestinal tract   ? Gastroparesis   ? IBS (irritable bowel syndrome)   ? constipation  ? Migraines   ? Neuropathy due to type 2 diabetes mellitus (HCC)   ? ? ?Past Surgical History:  ?Procedure Laterality Date  ? ABDOMINAL HYSTERECTOMY N/A 06/29/2016  ? Procedure: TOTAL ABDOMINAL HYSTERECTOMY;  Surgeon: Lazaro Arms, MD;  Location: [facility];  Service: Gynecology;  Laterality: N/A;  ? CESAREAN SECTION    ? CHOLECYSTECTOMY  2007  ? EYE SURGERY  1989  ? SALPINGOOPHORECTOMY Bilateral 06/29/2016  ? Procedure: BILATERAL SALPINGO OOPHORECTOMY;  Surgeon: Lazaro Arms, MD;  Location: [facility];  Service: Gynecology;  Laterality: Bilateral;  ? TONSILLECTOMY  1992  ? ? ?There were no vitals filed for this visit. ? ? Subjective Assessment - 12/09/21 0947   ? ? Subjective Pian ta a 5 today.   ? Pertinent History DM, neuropathy, latex allergy.   ? Currently in Pain? Yes   ? Pain Score 5    ? Pain Location Shoulder   ? ?  ?  ? ?  ? ? ? ? ? ? ? ? ? ? ? ? ? ? ? ? ? ? ? ? [facility] Adult PT Treatment/Exercise - 12/09/21 0001   ? ?  ? Exercises  ? Exercises Shoulder   ?  ? Shoulder Exercises: Standing  ? Other  Standing Exercises Red theraband to fatigue and biceps curls with red theraband to fatigue.   ?  ? Shoulder Exercises: Pulleys  ? Flexion Limitations 7 minutes.   ?  ? Shoulder Exercises: ROM/Strengthening  ? UBE (Upper Arm Bike) 8 minutes.   ?  ? Manual Therapy  ? Manual Therapy Soft tissue mobilization   ? Soft tissue mobilization STW/M x 10 minutes to patient's right UT/Supraspintus and posterior cuff including ischemic release technique to reduce tone.   ? ?  ?  ? ?  ? ? ? ? ? ? ? ? ? ? ? ? ? ? ? PT Long Term Goals - 11/10/21 1013   ? ?  ? PT LONG TERM GOAL #1  ? Title Independent with a HEP.   ? Baseline No knowledge of appropriate ther ex.   ? Time 4   ? Period Weeks   ? Status New   ?  ? PT LONG TERM GOAL #2  ? Title Sleep undistured.   ? Baseline Pain awakes patient   ? Time 4   ? Period Weeks   ? Status New   ?  ?  PT LONG TERM GOAL #3  ? Title Solid right shoulder strength 5/5.   ? Baseline 4+/5.   ? Time 4   ? Period Weeks   ? Status New   ?  ? PT LONG TERM GOAL #4  ? Title Perform ADL's with right shoulder pain not > 2-3/10.   ? Baseline Performing ADL's can produse near severe pain in patient's right shoulder.   ? Time 4   ? Period Weeks   ? Status New   ? ?  ?  ? ?  ? ? ? ? ? ? ? ? Plan - 12/09/21 1020   ? ? Clinical Impression Statement Patient presented to the clinic today with a 5/10 pain-level.  She has muliple trigger point in her RTC musculature.  Good response to soft tissue work.   ? Personal Factors and Comorbidities Comorbidity 1;Other   ? Comorbidities DM, neuropathy, latex allergy.   ? Examination-Activity Limitations Lift;Reach Overhead;Other;Sleep   ? Examination-Participation Restrictions Cleaning;Other   ? Stability/Clinical Decision Making Stable/Uncomplicated   ? Rehab Potential Good   ? PT Frequency 2x / week   ? PT Duration 6 weeks   ? PT Treatment/Interventions ADLs/Self Care Home Management;Therapeutic activities;Therapeutic exercise;Manual techniques;Patient/family  education;Passive range of motion   ? PT Next Visit Plan Soft tisse work, RW4, [facility] ER, full can, scapular exercises.   ? Consulted and Agree with Plan of Care Patient   ? ?  ?  ? ?  ? ? ?Patient will benefit from skilled therapeutic intervention in order to improve the following deficits and impairments:  Decreased activity tolerance, Decreased strength, Pain, Increased muscle spasms, Decreased range of motion ? ?Visit Diagnosis: ?Acute pain of right shoulder ? ?Other muscle spasm ? ? ? ? ?Problem List ?Patient Active Problem List  ? Diagnosis Date Noted  ? Hyperlipidemia associated with type 2 diabetes mellitus (HCC) 05/07/2021  ? Small fiber neuropathy 04/24/2020  ? Migraine without aura and without status migrainosus, not intractable 04/24/2020  ? Family history of hemophilia A 01/18/2017  ? Hx of total hysterectomy 06/29/2016  ? Diabetes (HCC) 06/19/2013  ? GERD 05/07/2010  ? ABDOMINAL PAIN, LEFT LOWER QUADRANT 04/09/2010  ? DEPRESSION 03/08/2010  ? CONSTIPATION 03/08/2010  ? HEMORRHAGE OF RECTUM AND ANUS 03/08/2010  ? ? ?[name], [place], PT ?12/09/2021, 10:25 AM ? ?[place] ?[facility] ?[address] ?[place], [place], [address] ?Phone: [phone]   Fax:  [phone] ? ?Name: [name] ?MRN: [id number] ?Date of Birth: February 18, 1981 ? ? ? ?

## 2021-12-13 ENCOUNTER — Other Ambulatory Visit: Payer: Self-pay

## 2021-12-13 ENCOUNTER — Ambulatory Visit: Payer: Medicaid Other | Admitting: Physical Therapy

## 2021-12-13 DIAGNOSIS — M25511 Pain in right shoulder: Secondary | ICD-10-CM

## 2021-12-13 DIAGNOSIS — M62838 Other muscle spasm: Secondary | ICD-10-CM | POA: Diagnosis not present

## 2021-12-13 NOTE — Therapy (Signed)
[place] ?[facility] ?[address] ?[place], [place], [address] ?Phone: [phone]   Fax:  [phone] ? ?Physical Therapy Treatment ? ?Patient Details  ?Name: [name] ?MRN: [id number] ?Date of Birth: 12-18-1980 ?Referring Provider (PT): Standley Dakins PA-C ? ? ?Encounter Date: 12/13/2021 ? ? PT End of Session - 12/13/21 5638   ? ? Visit Number 8   ? Number of Visits 12   ? Date for PT Re-Evaluation 12/29/21   ? PT Start Time 0820   ? PT Stop Time 0857   ? PT Time Calculation (min) 37 min   ? Activity Tolerance Patient tolerated treatment well   ? Behavior During Therapy [facility] for tasks assessed/performed   ? ?  ?  ? ?  ? ? ?Past Medical History:  ?Diagnosis Date  ? Anemia   ? Anxiety   ? Constipation   ? Diabetes mellitus without complication (HCC)   ? Family history of malignant neoplasm of gastrointestinal tract   ? Gastroparesis   ? IBS (irritable bowel syndrome)   ? constipation  ? Migraines   ? Neuropathy due to type 2 diabetes mellitus (HCC)   ? ? ?Past Surgical History:  ?Procedure Laterality Date  ? ABDOMINAL HYSTERECTOMY N/A 06/29/2016  ? Procedure: TOTAL ABDOMINAL HYSTERECTOMY;  Surgeon: Lazaro Arms, MD;  Location: [facility];  Service: Gynecology;  Laterality: N/A;  ? CESAREAN SECTION    ? CHOLECYSTECTOMY  2007  ? EYE SURGERY  1989  ? SALPINGOOPHORECTOMY Bilateral 06/29/2016  ? Procedure: BILATERAL SALPINGO OOPHORECTOMY;  Surgeon: Lazaro Arms, MD;  Location: [facility];  Service: Gynecology;  Laterality: Bilateral;  ? TONSILLECTOMY  1992  ? ? ?There were no vitals filed for this visit. ? ? Subjective Assessment - 12/13/21 0908   ? ? Subjective Pain around a 2 right now.  She states soft tissue work has been helpful, receiving about a day and a half of pain relief afterwards.   ? Pertinent History DM, neuropathy, latex allergy.   ? Patient Stated Goals Use right UE without pain.   ? Currently in Pain? Yes   ? Pain Score 2    ? Pain Location Shoulder   ? Pain Orientation  Right   ? Pain Descriptors / Indicators Shooting   ? Pain Type Acute pain   ? Pain Onset More than a month ago   ? ?  ?  ? ?  ? ? ? ? ? ? ? ? ? ? ? ? ? ? ? ? ? ? ? ? [facility] Adult PT Treatment/Exercise - 12/13/21 0001   ? ?  ? Exercises  ? Exercises Shoulder   ?  ? Shoulder Exercises: Sidelying  ? Other Sidelying Exercises SDLY ER to fatigue x 2 with 2#.   ?  ? Shoulder Exercises: Pulleys  ? Flexion Limitations 4 minutes.   ? Other Pulley Exercises UE Ranger on wall x 4 minutes into flexion and circles (CW and CCW).   ?  ? Shoulder Exercises: ROM/Strengthening  ? UBE (Upper Arm Bike) 10 minutes at 120 RPM's.   ?  ? Manual Therapy  ? Manual Therapy Soft tissue mobilization   ? Soft tissue mobilization STW/M x 11 minutes to reduce pain and tone to her right UT/Supraspinatus and posterior cuff musculature utilizing ischemic release technique.   ? ?  ?  ? ?  ? ? ? ? ? ? ? ? ? ? ? ? ? ? ? PT Long Term Goals -  11/10/21 1013   ? ?  ? PT LONG TERM GOAL #1  ? Title Independent with a HEP.   ? Baseline No knowledge of appropriate ther ex.   ? Time 4   ? Period Weeks   ? Status New   ?  ? PT LONG TERM GOAL #2  ? Title Sleep undistured.   ? Baseline Pain awakes patient   ? Time 4   ? Period Weeks   ? Status New   ?  ? PT LONG TERM GOAL #3  ? Title Solid right shoulder strength 5/5.   ? Baseline 4+/5.   ? Time 4   ? Period Weeks   ? Status New   ?  ? PT LONG TERM GOAL #4  ? Title Perform ADL's with right shoulder pain not > 2-3/10.   ? Baseline Performing ADL's can produse near severe pain in patient's right shoulder.   ? Time 4   ? Period Weeks   ? Status New   ? ?  ?  ? ?  ? ? ? ? ? ? ? ? Plan - 12/13/21 0913   ? ? Clinical Impression Statement Patient did well with treatment today.  Soft tissue has been helpful.  She has notable tone in her posterior cuff musculature.  She has active trigger points that reproduced her pain (referred) to her right elbow.   ? Personal Factors and Comorbidities Comorbidity 1;Other   ? Comorbidities  DM, neuropathy, latex allergy.   ? Examination-Activity Limitations Lift;Reach Overhead;Other;Sleep   ? Examination-Participation Restrictions Cleaning;Other   ? Stability/Clinical Decision Making Stable/Uncomplicated   ? Rehab Potential Good   ? PT Frequency 2x / week   ? PT Duration 6 weeks   ? PT Treatment/Interventions ADLs/Self Care Home Management;Therapeutic activities;Therapeutic exercise;Manual techniques;Patient/family education;Passive range of motion   ? PT Next Visit Plan Soft tisse work, RW4, [facility] ER, full can, scapular exercises.   ? Consulted and Agree with Plan of Care Patient   ? ?  ?  ? ?  ? ? ?Patient will benefit from skilled therapeutic intervention in order to improve the following deficits and impairments:  Decreased activity tolerance, Decreased strength, Pain, Increased muscle spasms, Decreased range of motion ? ?Visit Diagnosis: ?Acute pain of right shoulder ? ?Other muscle spasm ? ? ? ? ?Problem List ?Patient Active Problem List  ? Diagnosis Date Noted  ? Hyperlipidemia associated with type 2 diabetes mellitus (HCC) 05/07/2021  ? Small fiber neuropathy 04/24/2020  ? Migraine without aura and without status migrainosus, not intractable 04/24/2020  ? Family history of hemophilia A 01/18/2017  ? Hx of total hysterectomy 06/29/2016  ? Diabetes (HCC) 06/19/2013  ? GERD 05/07/2010  ? ABDOMINAL PAIN, LEFT LOWER QUADRANT 04/09/2010  ? DEPRESSION 03/08/2010  ? CONSTIPATION 03/08/2010  ? HEMORRHAGE OF RECTUM AND ANUS 03/08/2010  ? ? ?Sama Arauz, [place], PT ?12/13/2021, 9:16 AM ? ?[place] ?[facility] ?[address] ?[place], [place], [address] ?Phone: [phone]   Fax:  [phone] ? ?Name: MARILENA TREVATHAN ?MRN: [id number] ?Date of Birth: 1981/06/06 ? ? ? ?

## 2021-12-14 DIAGNOSIS — M25511 Pain in right shoulder: Secondary | ICD-10-CM | POA: Diagnosis not present

## 2021-12-16 ENCOUNTER — Ambulatory Visit: Payer: Medicaid Other

## 2021-12-16 DIAGNOSIS — M62838 Other muscle spasm: Secondary | ICD-10-CM

## 2021-12-16 DIAGNOSIS — M25511 Pain in right shoulder: Secondary | ICD-10-CM

## 2021-12-16 NOTE — Therapy (Signed)
[place] [facility] ?[address] ?[place], [place], [address] ?Phone: [phone]   Fax:  [phone] ? ?Physical Therapy Treatment ? ?Patient Details  ?Name: [name] ?MRN: [id number] ?Date of Birth: [birth year] ?Referring Provider (PT): Standley Dakins PA-C ? ? ?Encounter Date: 12/16/2021 ? ? PT End of Session - 12/16/21 0803   ? ? Visit Number 9   ? Number of Visits 12   ? Date for PT Re-Evaluation 12/29/21   ? PT Start Time 0815   ? PT Stop Time 0900   ? PT Time Calculation (min) 45 min   ? Activity Tolerance Patient tolerated treatment well   ? Behavior During Therapy [facility] for tasks assessed/performed   ? ?  ?  ? ?  ? ? ?Past Medical History:  ?Diagnosis Date  ? Anemia   ? Anxiety   ? Constipation   ? Diabetes mellitus without complication (HCC)   ? Family history of malignant neoplasm of gastrointestinal tract   ? Gastroparesis   ? IBS (irritable bowel syndrome)   ? constipation  ? Migraines   ? Neuropathy due to type 2 diabetes mellitus (HCC)   ? ? ?Past Surgical History:  ?Procedure Laterality Date  ? ABDOMINAL HYSTERECTOMY N/A 06/29/2016  ? Procedure: TOTAL ABDOMINAL HYSTERECTOMY;  Surgeon: Lazaro Arms, MD;  Location: [facility];  Service: Gynecology;  Laterality: N/A;  ? CESAREAN SECTION    ? CHOLECYSTECTOMY  2007  ? EYE SURGERY  1989  ? SALPINGOOPHORECTOMY Bilateral 06/29/2016  ? Procedure: BILATERAL SALPINGO OOPHORECTOMY;  Surgeon: Lazaro Arms, MD;  Location: [facility];  Service: Gynecology;  Laterality: Bilateral;  ? TONSILLECTOMY  1992  ? ? ?There were no vitals filed for this visit. ? ? Subjective Assessment - 12/16/21 0803   ? ? Subjective Pt arrives for today's treatment session reporting 3/10 right shoulder pain.  Pt states that MRI did not show any tears or injuries.  Pt frustrated with MD at this time.   ? Pertinent History DM, neuropathy, latex allergy.   ? Patient Stated Goals Use right UE without pain.   ? Currently in Pain? Yes   ? Pain Score 3    ? Pain  Location Shoulder   ? Pain Orientation Right   ? Pain Onset More than a month ago   ? ?  ?  ? ?  ? ? ? ? ? ? ? ? ? ? ? ? ? ? ? ? ? ? ? ? [facility] Adult PT Treatment/Exercise - 12/16/21 0001   ? ?  ? Shoulder Exercises: Pulleys  ? Flexion 5 minutes   ?  ? Shoulder Exercises: ROM/Strengthening  ? UBE (Upper Arm Bike) 10 mins at 90 RPMs (forward/backward)   ? Ranger Flexion, CW and CCW circles x 4 mins   ?  ? Manual Therapy  ? Manual Therapy Soft tissue mobilization   ? Soft tissue mobilization STW/M to right rotator cuff musculature to decrease pain and tone   ? ?  ?  ? ?  ? ? ? ? ? ? ? ? ? ? ? ? ? ? ? PT Long Term Goals - 11/10/21 1013   ? ?  ? PT LONG TERM GOAL #1  ? Title Independent with a HEP.   ? Baseline No knowledge of appropriate ther ex.   ? Time 4   ? Period Weeks   ? Status New   ?  ? PT LONG TERM GOAL #2  ? Title Sleep  undistured.   ? Baseline Pain awakes patient   ? Time 4   ? Period Weeks   ? Status New   ?  ? PT LONG TERM GOAL #3  ? Title Solid right shoulder strength 5/5.   ? Baseline 4+/5.   ? Time 4   ? Period Weeks   ? Status New   ?  ? PT LONG TERM GOAL #4  ? Title Perform ADL's with right shoulder pain not > 2-3/10.   ? Baseline Performing ADL's can produse near severe pain in patient's right shoulder.   ? Time 4   ? Period Weeks   ? Status New   ? ?  ?  ? ?  ? ? ? ? ? ? ? ? Plan - 12/16/21 0803   ? ? Clinical Impression Statement Pt arrives for today's treatment session reporting 3/10 right shoulder pain.  Pt reports that her MD stated that her MRI was unremarkable which she openly states is "frustrating."  Pt with continued notable tone in her posterior rotator cuff musculature.  Marked tenderness in several trigger points causing pt to "jump" with palpation.  Pt reported 2/10 right shoulder pain at completion of today's treatment session.   ? Personal Factors and Comorbidities Comorbidity 1;Other   ? Comorbidities DM, neuropathy, latex allergy.   ? Examination-Activity Limitations Lift;Reach  Overhead;Other;Sleep   ? Examination-Participation Restrictions Cleaning;Other   ? Stability/Clinical Decision Making Stable/Uncomplicated   ? Rehab Potential Good   ? PT Frequency 2x / week   ? PT Duration 6 weeks   ? PT Treatment/Interventions ADLs/Self Care Home Management;Therapeutic activities;Therapeutic exercise;Manual techniques;Patient/family education;Passive range of motion   ? PT Next Visit Plan Soft tisse work, RW4, [facility] ER, full can, scapular exercises.   ? Consulted and Agree with Plan of Care Patient   ? ?  ?  ? ?  ? ? ?Patient will benefit from skilled therapeutic intervention in order to improve the following deficits and impairments:  Decreased activity tolerance, Decreased strength, Pain, Increased muscle spasms, Decreased range of motion ? ?Visit Diagnosis: ?Acute pain of right shoulder ? ?Other muscle spasm ? ? ? ? ?Problem List ?Patient Active Problem List  ? Diagnosis Date Noted  ? Hyperlipidemia associated with type 2 diabetes mellitus (HCC) 05/07/2021  ? Small fiber neuropathy 04/24/2020  ? Migraine without aura and without status migrainosus, not intractable 04/24/2020  ? Family history of hemophilia A 01/18/2017  ? Hx of total hysterectomy 06/29/2016  ? Diabetes (HCC) 06/19/2013  ? GERD 05/07/2010  ? ABDOMINAL PAIN, LEFT LOWER QUADRANT 04/09/2010  ? DEPRESSION 03/08/2010  ? CONSTIPATION 03/08/2010  ? HEMORRHAGE OF RECTUM AND ANUS 03/08/2010  ? ? ?Newman Pies, PTA ?12/16/2021, 9:10 AM ? ?[place] [facility] ?[address] ?[place], [place], [address] ?Phone: [phone]   Fax:  [phone] ? ?Name: [name] ?MRN: [id number] ?Date of Birth: 01/06/1981 ? ? ? ?

## 2021-12-20 ENCOUNTER — Ambulatory Visit: Payer: Medicaid Other | Attending: Physician Assistant | Admitting: Physical Therapy

## 2021-12-20 DIAGNOSIS — M25511 Pain in right shoulder: Secondary | ICD-10-CM | POA: Diagnosis not present

## 2021-12-20 DIAGNOSIS — M62838 Other muscle spasm: Secondary | ICD-10-CM | POA: Insufficient documentation

## 2021-12-20 NOTE — Therapy (Signed)
[place] [facility] ?[address] ?[place], [place], [address] ?Phone: [phone]   Fax:  [phone] ? ?Physical Therapy Treatment ? ?Patient Details  ?Name: [name] ?MRN: [id number] ?Date of Birth: 07-07-1981 ?Referring Provider (PT): Standley Dakins PA-C ? ? ?Encounter Date: 12/20/2021 ? ? PT End of Session - 12/20/21 0827   ? ? Visit Number 10   ? Number of Visits 12   ? Date for PT Re-Evaluation 12/29/21   ? PT Start Time 0815   ? PT Stop Time 0858   ? PT Time Calculation (min) 43 min   ? Activity Tolerance Patient tolerated treatment well   ? Behavior During Therapy [facility] for tasks assessed/performed   ? ?  ?  ? ?  ? ? ?Past Medical History:  ?Diagnosis Date  ? Anemia   ? Anxiety   ? Constipation   ? Diabetes mellitus without complication (HCC)   ? Family history of malignant neoplasm of gastrointestinal tract   ? Gastroparesis   ? IBS (irritable bowel syndrome)   ? constipation  ? Migraines   ? Neuropathy due to type 2 diabetes mellitus (HCC)   ? ? ?Past Surgical History:  ?Procedure Laterality Date  ? ABDOMINAL HYSTERECTOMY N/A 06/29/2016  ? Procedure: TOTAL ABDOMINAL HYSTERECTOMY;  Surgeon: Lazaro Arms, MD;  Location: [facility];  Service: Gynecology;  Laterality: N/A;  ? CESAREAN SECTION    ? CHOLECYSTECTOMY  2007  ? EYE SURGERY  1989  ? SALPINGOOPHORECTOMY Bilateral 06/29/2016  ? Procedure: BILATERAL SALPINGO OOPHORECTOMY;  Surgeon: Lazaro Arms, MD;  Location: [facility];  Service: Gynecology;  Laterality: Bilateral;  ? TONSILLECTOMY  1992  ? ? ?There were no vitals filed for this visit. ? ? Subjective Assessment - 12/20/21 0830   ? ? Subjective pain not bad this morning but was high over the weekend.  Hit elbow yesterday which hurt a lot.   ? Pertinent History DM, neuropathy, latex allergy.   ? Patient Stated Goals Use right UE without pain.   ? Currently in Pain? Yes   ? Pain Score 3    ? Pain Orientation Right   ? Pain Descriptors / Indicators Shooting   ? Pain Type  Acute pain   ? Pain Onset More than a month ago   ? ?  ?  ? ?  ? ? ? ? ? ? ? ? ? ? ? ? ? ? ? ? ? ? ? ? [facility] Adult PT Treatment/Exercise - 12/20/21 0001   ? ?  ? Exercises  ? Exercises Shoulder   ?  ? Shoulder Exercises: Standing  ? Other Standing Exercises RW4 red theraband   ? Other Standing Exercises 2# ER with shoulder at 45 degrees abduction.   ?  ? Shoulder Exercises: Pulleys  ? Flexion 5 minutes   ?  ? Shoulder Exercises: ROM/Strengthening  ? UBE (Upper Arm Bike) 10 minutes.   ? Ranger 5 minutes   ?  ? Manual Therapy  ? Manual Therapy Soft tissue mobilization   ? Soft tissue mobilization STW/M x 12 minutes to patient's right UT/Supraspinatus and posterior cuff musculture x 12 minutes with ischemic release technique utilized to decrease tone.   ? ?  ?  ? ?  ? ? ? ? ? ? ? ? ? ? ? ? ? ? ? PT Long Term Goals - 12/20/21 0928   ? ?  ? PT LONG TERM GOAL #1  ? Title Independent with a HEP.   ?  Time 4   ? Period Weeks   ? Status On-going   ?  ? PT LONG TERM GOAL #2  ? Title Sleep undistured.   ? Time 4   ? Period Weeks   ? Status On-going   ?  ? PT LONG TERM GOAL #3  ? Title Solid right shoulder strength 5/5.   ? Time 4   ? Period Weeks   ? Status On-going   ?  ? PT LONG TERM GOAL #4  ? Title Perform ADL's with right shoulder pain not > 2-3/10.   ? Time 4   ? Period Weeks   ? Status On-going   ? ?  ?  ? ?  ? ? ? ? ? ? ? ? Plan - 12/20/21 0931   ? ? Clinical Impression Statement See "Therapy Note"section.   ? Personal Factors and Comorbidities Comorbidity 1;Other   ? Examination-Activity Limitations Lift;Reach Overhead;Other;Sleep   ? Examination-Participation Restrictions Cleaning;Other   ? Stability/Clinical Decision Making Stable/Uncomplicated   ? Rehab Potential Good   ? PT Frequency 2x / week   ? PT Duration 6 weeks   ? PT Treatment/Interventions ADLs/Self Care Home Management;Therapeutic activities;Therapeutic exercise;Manual techniques;Patient/family education;Passive range of motion   ? PT Next Visit Plan Soft  tisse work, RW4, [facility] ER, full can, scapular exercises.   ? Consulted and Agree with Plan of Care Patient   ? ?  ?  ? ?  ? ? ?Patient will benefit from skilled therapeutic intervention in order to improve the following deficits and impairments:  Decreased activity tolerance, Decreased strength, Pain, Increased muscle spasms, Decreased range of motion ? ?Visit Diagnosis: ?Acute pain of right shoulder ? ?Other muscle spasm ? ? ? ? ?Problem List ?Patient Active Problem List  ? Diagnosis Date Noted  ? Hyperlipidemia associated with type 2 diabetes mellitus (HCC) 05/07/2021  ? Small fiber neuropathy 04/24/2020  ? Migraine without aura and without status migrainosus, not intractable 04/24/2020  ? Family history of hemophilia A 01/18/2017  ? Hx of total hysterectomy 06/29/2016  ? Diabetes (HCC) 06/19/2013  ? GERD 05/07/2010  ? ABDOMINAL PAIN, LEFT LOWER QUADRANT 04/09/2010  ? DEPRESSION 03/08/2010  ? CONSTIPATION 03/08/2010  ? HEMORRHAGE OF RECTUM AND ANUS 03/08/2010  ? ?Progress Note ?Reporting Period 11/10/21 to 12/20/21.   ? ?See note below for Objective Data and Assessment of Progress/Goals. Patient's goals are ongoing at this time.  She subjectively states she is "25 to 30% better at this time. ? ? ? ?[name], [place], PT ?12/20/2021, 9:37 AM ? ?[place] [facility] ?[address] ?[place], [place], [address] ?Phone: [phone]   Fax:  [phone] ? ?Name: [name] ?MRN: [id number] ?Date of Birth: Jan 22, 1981 ? ? ? ?

## 2021-12-23 ENCOUNTER — Ambulatory Visit: Payer: Medicaid Other

## 2021-12-23 DIAGNOSIS — M25511 Pain in right shoulder: Secondary | ICD-10-CM

## 2021-12-23 DIAGNOSIS — M62838 Other muscle spasm: Secondary | ICD-10-CM

## 2021-12-23 NOTE — Therapy (Signed)
[place] ?[facility] ?[address] ?[place], [place], [address] ?Phone: [phone]   Fax:  [phone] ? ?Physical Therapy Treatment ? ?Patient Details  ?Name: [name] ?MRN: [id number] ?Date of Birth: 1981/09/11 ?Referring Provider (PT): Standley Dakins PA-C ? ? ?Encounter Date: 12/23/2021 ? ? PT End of Session - 12/23/21 0813   ? ? Visit Number 11   ? Number of Visits 12   ? Date for PT Re-Evaluation 12/29/21   ? PT Start Time 0815   ? PT Stop Time 0900   ? PT Time Calculation (min) 45 min   ? Activity Tolerance Patient tolerated treatment well   ? Behavior During Therapy [facility] for tasks assessed/performed   ? ?  ?  ? ?  ? ? ?Past Medical History:  ?Diagnosis Date  ? Anemia   ? Anxiety   ? Constipation   ? Diabetes mellitus without complication (HCC)   ? Family history of malignant neoplasm of gastrointestinal tract   ? Gastroparesis   ? IBS (irritable bowel syndrome)   ? constipation  ? Migraines   ? Neuropathy due to type 2 diabetes mellitus (HCC)   ? ? ?Past Surgical History:  ?Procedure Laterality Date  ? ABDOMINAL HYSTERECTOMY N/A 06/29/2016  ? Procedure: TOTAL ABDOMINAL HYSTERECTOMY;  Surgeon: Lazaro Arms, MD;  Location: [facility];  Service: Gynecology;  Laterality: N/A;  ? CESAREAN SECTION    ? CHOLECYSTECTOMY  2007  ? EYE SURGERY  1989  ? SALPINGOOPHORECTOMY Bilateral 06/29/2016  ? Procedure: BILATERAL SALPINGO OOPHORECTOMY;  Surgeon: Lazaro Arms, MD;  Location: [facility];  Service: Gynecology;  Laterality: Bilateral;  ? TONSILLECTOMY  1992  ? ? ?There were no vitals filed for this visit. ? ? Subjective Assessment - 12/23/21 0813   ? ? Subjective Pt arrives for today's treatment session denying any pain.  Pt reports that her TENs unit has not come in yet.   ? Pertinent History DM, neuropathy, latex allergy.   ? Patient Stated Goals Use right UE without pain.   ? Currently in Pain? No/denies   ? Pain Onset More than a month ago   ? ?  ?  ? ?   ? ? ? ? ? ? ? ? ? ? ? ? ? ? ? ? ? ? ? ? [facility] Adult PT Treatment/Exercise - 12/23/21 0001   ? ?  ? Shoulder Exercises: Standing  ? Protraction Strengthening;Right;20 reps   ? Theraband Level (Shoulder Protraction) Level 2 (Red)   ? External Rotation Strengthening;Right;20 reps   ? Theraband Level (Shoulder External Rotation) Level 2 (Red)   ? Internal Rotation Strengthening;Right;20 reps;Theraband   ? Theraband Level (Shoulder Internal Rotation) Level 2 (Red)   ? Flexion Strengthening;Right;20 reps   ? Theraband Level (Shoulder Flexion) Level 2 (Red)   ? ABduction Strengthening;Right;20 reps;Theraband   ? Theraband Level (Shoulder ABduction) Level 2 (Red)   ? Extension Strengthening;Right;20 reps;Theraband   ? Theraband Level (Shoulder Extension) Level 2 (Red)   ? Row Strengthening;Right;20 reps;Theraband   ? Theraband Level (Shoulder Row) Level 2 (Red)   ?  ? Shoulder Exercises: Pulleys  ? Flexion 5 minutes   ?  ? Shoulder Exercises: ROM/Strengthening  ? UBE (Upper Arm Bike) 10 mins (forward/backward)   ? Ranger Flexion, CW, and CCW circles x 2 mins each   ?  ? Manual Therapy  ? Manual Therapy Soft tissue mobilization   ? Soft tissue mobilization STW/M to right upper trap and right deltoid  to decrease pain and tone   ? ?  ?  ? ?  ? ? ? ? ? ? ? ? ? ? ? ? ? ? ? PT Long Term Goals - 12/20/21 0928   ? ?  ? PT LONG TERM GOAL #1  ? Title Independent with a HEP.   ? Time 4   ? Period Weeks   ? Status On-going   ?  ? PT LONG TERM GOAL #2  ? Title Sleep undistured.   ? Time 4   ? Period Weeks   ? Status On-going   ?  ? PT LONG TERM GOAL #3  ? Title Solid right shoulder strength 5/5.   ? Time 4   ? Period Weeks   ? Status On-going   ?  ? PT LONG TERM GOAL #4  ? Title Perform ADL's with right shoulder pain not > 2-3/10.   ? Time 4   ? Period Weeks   ? Status On-going   ? ?  ?  ? ?  ? ? ? ? ? ? ? ? Plan - 12/23/21 0813   ? ? Clinical Impression Statement Pt arrives for today's treatment session denying any pain.  Pt frustrated  that her TENs unit has not arrived yet.  Pt able to tolerate addition of several standing theraband shoulder exercises with slight increase in pain.  Pt requiring cues during ER and IR to keep elbow at her side.  Pt requiring min cues for proper technique and posture during other standing exercises.  STW/M performed to right upper trap and right deltoid to decrease pain and tone.  Pt denied any pain and reported decreased tightness at completion of today's treatment session.   ? Personal Factors and Comorbidities Comorbidity 1;Other   ? Examination-Activity Limitations Lift;Reach Overhead;Other;Sleep   ? Examination-Participation Restrictions Cleaning;Other   ? Stability/Clinical Decision Making Stable/Uncomplicated   ? Rehab Potential Good   ? PT Frequency 2x / week   ? PT Duration 6 weeks   ? PT Treatment/Interventions ADLs/Self Care Home Management;Therapeutic activities;Therapeutic exercise;Manual techniques;Patient/family education;Passive range of motion   ? PT Next Visit Plan Soft tisse work, RW4, [facility] ER, full can, scapular exercises.   ? Consulted and Agree with Plan of Care Patient   ? ?  ?  ? ?  ? ? ?Patient will benefit from skilled therapeutic intervention in order to improve the following deficits and impairments:  Decreased activity tolerance, Decreased strength, Pain, Increased muscle spasms, Decreased range of motion ? ?Visit Diagnosis: ?Acute pain of right shoulder ? ?Other muscle spasm ? ? ? ? ?Problem List ?Patient Active Problem List  ? Diagnosis Date Noted  ? Hyperlipidemia associated with type 2 diabetes mellitus (HCC) 05/07/2021  ? Small fiber neuropathy 04/24/2020  ? Migraine without aura and without status migrainosus, not intractable 04/24/2020  ? Family history of hemophilia A 01/18/2017  ? Hx of total hysterectomy 06/29/2016  ? Diabetes (HCC) 06/19/2013  ? GERD 05/07/2010  ? ABDOMINAL PAIN, LEFT LOWER QUADRANT 04/09/2010  ? DEPRESSION 03/08/2010  ? CONSTIPATION 03/08/2010  ? HEMORRHAGE  OF RECTUM AND ANUS 03/08/2010  ? ? ?Newman Pies, PTA ?12/23/2021, 9:37 AM ? ?[place] ?[facility] ?[address] ?[place], [place], [address] ?Phone: [phone]   Fax:  [phone] ? ?Name: [name] ?MRN: [id number] ?Date of Birth: 10-25-1980 ? ? ? ?

## 2021-12-27 ENCOUNTER — Ambulatory Visit: Payer: Medicaid Other

## 2021-12-27 DIAGNOSIS — M25511 Pain in right shoulder: Secondary | ICD-10-CM | POA: Diagnosis not present

## 2021-12-27 DIAGNOSIS — M62838 Other muscle spasm: Secondary | ICD-10-CM | POA: Diagnosis not present

## 2021-12-27 NOTE — Therapy (Addendum)
[place] ?[facility] ?[address] ?[place], [place], [address] ?Phone: [phone]   Fax:  [phone] ? ?Physical Therapy Treatment ? ?Patient Details  ?Name: [name] ?MRN: [id number] ?Date of Birth: September 30, 1980 ?Referring Provider (PT): Shelle Iron PA-C ? ? ?Encounter Date: 12/27/2021 ? ? PT End of Session - 12/27/21 2831   ? ? Visit Number 12   ? Number of Visits 12   ? Date for PT Re-Evaluation 12/29/21   ? PT Start Time 0815   ? PT Stop Time 0900   ? PT Time Calculation (min) 45 min   ? Activity Tolerance Patient tolerated treatment well   ? Behavior During Therapy [facility] for tasks assessed/performed   ? ?  ?  ? ?  ? ? ?Past Medical History:  ?Diagnosis Date  ? Anemia   ? Anxiety   ? Constipation   ? Diabetes mellitus without complication ([place])   ? Family history of malignant neoplasm of gastrointestinal tract   ? Gastroparesis   ? IBS (irritable bowel syndrome)   ? constipation  ? Migraines   ? Neuropathy due to type 2 diabetes mellitus ([place])   ? ? ?Past Surgical History:  ?Procedure Laterality Date  ? ABDOMINAL HYSTERECTOMY N/A 06/29/2016  ? Procedure: TOTAL ABDOMINAL HYSTERECTOMY;  Surgeon: Florian Buff, MD;  Location: [facility];  Service: Gynecology;  Laterality: N/A;  ? CESAREAN SECTION    ? CHOLECYSTECTOMY  2007  ? EYE SURGERY  1989  ? SALPINGOOPHORECTOMY Bilateral 06/29/2016  ? Procedure: BILATERAL SALPINGO OOPHORECTOMY;  Surgeon: Florian Buff, MD;  Location: [facility];  Service: Gynecology;  Laterality: Bilateral;  ? TONSILLECTOMY  1992  ? ? ?There were no vitals filed for this visit. ? ? Subjective Assessment - 12/27/21 0820   ? ? Subjective Pt arrives for today's treatment session denying any pain.  Pt states that her TENs unit came in over the weekend and things going great.   ? Pertinent History DM, neuropathy, latex allergy.   ? Patient Stated Goals Use right UE without pain.   ? Currently in Pain? No/denies   ? Pain Onset More than a month ago   ? ?  ?  ? ?   ? ? ? ? ? ? ? ? ? ? ? ? ? ? ? ? ? ? ? ? [facility] Adult PT Treatment/Exercise - 12/27/21 0001   ? ?  ? Shoulder Exercises: Standing  ? Protraction Strengthening;Right;20 reps   ? Theraband Level (Shoulder Protraction) Level 2 (Red)   ? Horizontal ABduction Strengthening;Both;20 reps;Theraband   ? Theraband Level (Shoulder Horizontal ABduction) Level 2 (Red)   ? External Rotation Strengthening;Right;20 reps   ? Theraband Level (Shoulder External Rotation) Level 2 (Red)   ? Internal Rotation Strengthening;Right;20 reps;Theraband   ? Theraband Level (Shoulder Internal Rotation) Level 2 (Red)   ? Flexion Strengthening;Right;20 reps   ? Theraband Level (Shoulder Flexion) Level 2 (Red)   ? ABduction Strengthening;Right;20 reps;Theraband   ? Theraband Level (Shoulder ABduction) Level 2 (Red)   ? Extension Strengthening;Right;20 reps;Theraband   ? Theraband Level (Shoulder Extension) Level 2 (Red)   ? Row Strengthening;Right;20 reps;Theraband   ? Theraband Level (Shoulder Row) Level 2 (Red)   ? Diagonals Right;20 reps;Theraband   ? Theraband Level (Shoulder Diagonals) Level 2 (Red)   ?  ? Shoulder Exercises: Pulleys  ? Flexion 5 minutes   ?  ? Shoulder Exercises: ROM/Strengthening  ? UBE (Upper Arm Bike) 10 mins (forward/backward)   ?  ?  Manual Therapy  ? Manual Therapy Soft tissue mobilization   ? Soft tissue mobilization STW/M to right upper trap and right deltoid to decrease pain and tone   ? ?  ?  ? ?  ? ? ? ? ? ? ? ? ? ? ? ? ? ? ? PT Long Term Goals - 12/27/21 0822   ? ?  ? PT LONG TERM GOAL #1  ? Title Independent with a HEP.   ? Time 4   ? Period Weeks   ? Status Achieved   ?  ? PT LONG TERM GOAL #2  ? Title Sleep undistured.   ? Baseline 12/27/21: sleeping has improved, but not sleeping through the night at this point   ? Time 4   ? Period Weeks   ? Status Partially Met   ?  ? PT LONG TERM GOAL #3  ? Title Solid right shoulder strength 5/5.   ? Baseline 4+/5 ER, 5/5 all other directions   ? Time 4   ? Period Weeks   ?  Status On-going   ?  ? PT LONG TERM GOAL #4  ? Title Perform ADL's with right shoulder pain not > 2-3/10.   ? Baseline 12/27/21: depends on the day and activities done   ? Time 4   ? Period Weeks   ? Status Partially Met   ? ?  ?  ? ?  ? ? ? ? ? ? ? ? Plan - 12/27/21 0821   ? ? Clinical Impression Statement Pt arrives for today's treatment session denying any pain.  Pt reports that her TENs unit arrived over the weekend and she is pleased with the results.  Reviewed all standing resisted shoulder exercises with addition of horizontal abduction and D1 flexion.  Pt requiring min cues for proper technique, especially ER/IR and newly added exercises.  STW/M performed to right upper trap to decrease pain and tone with good carry over.  Pt denied any pain at completion of today's treatment session.  Pt has made some progress towards some of her goals and has met others.  Pt is ready to discharge at this time.  Pt instructed to call facility with any questions or concerns.   ? Personal Factors and Comorbidities Comorbidity 1;Other   ? Examination-Activity Limitations Lift;Reach Overhead;Other;Sleep   ? Examination-Participation Restrictions Cleaning;Other   ? Stability/Clinical Decision Making Stable/Uncomplicated   ? Rehab Potential Good   ? PT Frequency 2x / week   ? PT Duration 6 weeks   ? PT Treatment/Interventions ADLs/Self Care Home Management;Therapeutic activities;Therapeutic exercise;Manual techniques;Patient/family education;Passive range of motion   ? PT Next Visit Plan Soft tisse work, RW4, [facility] ER, full can, scapular exercises.   ? Consulted and Agree with Plan of Care Patient   ? ?  ?  ? ?  ? ? ?Patient will benefit from skilled therapeutic intervention in order to improve the following deficits and impairments:  Decreased activity tolerance, Decreased strength, Pain, Increased muscle spasms, Decreased range of motion ? ?Visit Diagnosis: ?Acute pain of right shoulder ? ?Other muscle spasm ? ? ? ? ?Problem  List ?Patient Active Problem List  ? Diagnosis Date Noted  ? Hyperlipidemia associated with type 2 diabetes mellitus ([place]) 05/07/2021  ? Small fiber neuropathy 04/24/2020  ? Migraine without aura and without status migrainosus, not intractable 04/24/2020  ? Family history of hemophilia A 01/18/2017  ? Hx of total hysterectomy 06/29/2016  ? Diabetes ([place]) 06/19/2013  ? GERD 05/07/2010  ?  ABDOMINAL PAIN, LEFT LOWER QUADRANT 04/09/2010  ? DEPRESSION 03/08/2010  ? CONSTIPATION 03/08/2010  ? HEMORRHAGE OF RECTUM AND ANUS 03/08/2010  ? ? ?Kathrynn Ducking, PTA ?12/27/2021, 9:11 AM ? ?[place] ?[facility] ?[address] ?[place], [place], [address] ?Phone: [phone]   Fax:  [phone] ? ?Name: [name] ?MRN: [id number] ?Date of Birth: 17-May-1981 ? ? ?PHYSICAL THERAPY DISCHARGE SUMMARY ? ?Visits from Start of Care: 12. ? ?Current functional level related to goals / functional outcomes: ?See above.  ?  ?Remaining deficits: ?See goal section. ?  ?Education / Equipment: ?HEP.  ? ?Patient agrees to discharge. Patient goals were partially met. Patient is being discharged due to   . ? ? ? ?Mali Applegate MPT ? ? ?

## 2022-01-01 ENCOUNTER — Other Ambulatory Visit: Payer: Self-pay | Admitting: Obstetrics & Gynecology

## 2022-01-19 DIAGNOSIS — M47812 Spondylosis without myelopathy or radiculopathy, cervical region: Secondary | ICD-10-CM | POA: Diagnosis not present

## 2022-01-19 DIAGNOSIS — M25511 Pain in right shoulder: Secondary | ICD-10-CM | POA: Diagnosis not present

## 2022-01-25 ENCOUNTER — Other Ambulatory Visit: Payer: Self-pay

## 2022-01-25 ENCOUNTER — Telehealth: Payer: Self-pay | Admitting: Obstetrics & Gynecology

## 2022-01-25 MED ORDER — ALPRAZOLAM 1 MG PO TABS: 1.0000 mg | ORAL_TABLET | Freq: Every day | ORAL | 5 refills | Status: DC | PRN

## 2022-01-25 NOTE — Telephone Encounter (Signed)
Pharmacy called to get an update on prescription request for patient's alprazolam. Please advise.  ?

## 2022-01-27 ENCOUNTER — Ambulatory Visit: Payer: Medicaid Other | Attending: Orthopedic Surgery

## 2022-01-27 DIAGNOSIS — M25511 Pain in right shoulder: Secondary | ICD-10-CM | POA: Insufficient documentation

## 2022-01-27 DIAGNOSIS — M62838 Other muscle spasm: Secondary | ICD-10-CM | POA: Insufficient documentation

## 2022-02-02 ENCOUNTER — Ambulatory Visit: Payer: Medicaid Other | Admitting: Physical Therapy

## 2022-02-02 DIAGNOSIS — M25511 Pain in right shoulder: Secondary | ICD-10-CM | POA: Diagnosis not present

## 2022-02-02 DIAGNOSIS — M62838 Other muscle spasm: Secondary | ICD-10-CM

## 2022-02-02 NOTE — Therapy (Signed)
?[facility] ?[address] ?[place], [place], [address] ?Phone: [phone]   Fax:  [phone] ? ?Physical Therapy Evaluation ? ?Patient Details  ?Name: [name] ?MRN: [id number] ?Date of Birth: [birth year] ?Referring Provider (PT): Malon Kindle MD ? ? ?Encounter Date: 02/02/2022 ? ? PT End of Session - 02/02/22 0853   ? ? Visit Number 1   ? Number of Visits 8   ? Date for PT Re-Evaluation 03/02/22   ? PT Start Time [phone]   ? PT Stop Time 0835   ? PT Time Calculation (min) 19 min   ? Activity Tolerance Patient tolerated treatment well   ? Behavior During Therapy [facility] for tasks assessed/performed   ? ?  ?  ? ?  ? ? ?Past Medical History:  ?Diagnosis Date  ? Anemia   ? Anxiety   ? Constipation   ? Diabetes mellitus without complication (HCC)   ? Family history of malignant neoplasm of gastrointestinal tract   ? Gastroparesis   ? IBS (irritable bowel syndrome)   ? constipation  ? Migraines   ? Neuropathy due to type 2 diabetes mellitus (HCC)   ? ? ?Past Surgical History:  ?Procedure Laterality Date  ? ABDOMINAL HYSTERECTOMY N/A 06/29/2016  ? Procedure: TOTAL ABDOMINAL HYSTERECTOMY;  Surgeon: Lazaro Arms, MD;  Location: [facility];  Service: Gynecology;  Laterality: N/A;  ? CESAREAN SECTION    ? CHOLECYSTECTOMY  2007  ? EYE SURGERY  1989  ? SALPINGOOPHORECTOMY Bilateral 06/29/2016  ? Procedure: BILATERAL SALPINGO OOPHORECTOMY;  Surgeon: Lazaro Arms, MD;  Location: [facility];  Service: Gynecology;  Laterality: Bilateral;  ? TONSILLECTOMY  1992  ? ? ?There were no vitals filed for this visit. ? ? ? Subjective Assessment - 02/02/22 0906   ? ? Subjective The patient return to today for re-assessment.  Her last PT visit was on 12/27/21.  She states she went back to work full force and her right shoulder swelled.  She continues to work but is not doing heavy activity.  She reports no pain today.  She has been using a TENS unit and it has been very helpful.   ? Pertinent History DM,  neuropathy, latex allergy.   ? Patient Stated Goals Use right UE without pain.   ? Currently in Pain? Yes   ? Pain Score 0-No pain   ? ?  ?  ? ?  ? ? ? ? ? [facility] PT Assessment - 02/02/22 0001   ? ?  ? Assessment  ? Medical Diagnosis Pain in right shoulder.   ? Referring Provider (PT) Malon Kindle MD   ? Onset Date/Surgical Date --   09/27/21  ?  ? Precautions  ? Precautions None   ?  ? Restrictions  ? Weight Bearing Restrictions No   ?  ? Balance Screen  ? Has the patient fallen in the past 6 months Yes   ? How many times? 2...slipped on ice and fell.   ? Has the patient had a decrease in activity level because of a fear of falling?  No   ? Is the patient reluctant to leave their home because of a fear of falling?  No   ?  ? Home Environment  ? Living Environment Private residence   ?  ? Prior Function  ? Level of Independence Independent   ?  ? Posture/Postural Control  ? Posture/Postural Control Postural limitations   ? Postural Limitations Rounded Shoulders;Forward head   ?  ?  Deep Tendon Reflexes  ? DTR Assessment Site Biceps;Brachioradialis;Triceps   ? Biceps DTR 2+   ? Brachioradialis DTR 2+   ? Triceps DTR 2+   ?  ? ROM / Strength  ? AROM / PROM / Strength AROM;Strength   ?  ? AROM  ? Overall AROM Comments Full active right shoulder range of motion.   ?  ? Strength  ? Overall Strength Comments Right shoulder essentially normal with ER graded at 4+/5.   ?  ? Palpation  ? Palpation comment Tender to palpation over right Levator Scapulae and some tenderness in area of right acromial ridge, middle deltoid and mildly over bicipital tendon.   ?  ? Special Tests  ?  Special Tests Rotator Cuff Impingement   ? Other special tests Negative Speed's test.   ? Rotator Cuff Impingment tests Leanord AsalHawkins- Kennedy test   ?  ? Neer Impingement test   ? Findings --   MIldly positive.  ? Side Right   ? ?  ?  ? ?  ? ? ? ? ? ? ? ? ? ? ? ? ? ?Objective measurements completed on examination: See above findings.   ? ? ? ? ? ? ? ? ? ? ? ? ? ? ? ? ? ? ? PT Long Term Goals - 02/02/22 0943   ? ?  ? PT LONG TERM GOAL #1  ? Title Independent with an advanced HEP.   ? Baseline No knowledge of appropriate advanced ther ex.   ? Time 4   ? Period Weeks   ? Status New   ?  ? PT LONG TERM GOAL #2  ? Title Return to pre-injury work duties.   ? Baseline Patient is tending toward lighter duty work activities.   ? Time 4   ? Period Weeks   ? Status New   ?  ? PT LONG TERM GOAL #3  ? Title Solid right ER shoulder strength 5/5.   ? Baseline 4+/5.   ? Time 4   ? Period Weeks   ? Status New   ? ?  ?  ? ?  ? ? ? ? ? ? ? ? ? Plan - 02/02/22 0937   ? ? Clinical Impression Statement The patient returns to PT today.  She states her right shoulder has improved a great deal since her original injury date (09/27/21) and since her last PT visit on 12/27/21.  Today, she reports no pain but if she overworks her pain will increase. She has been using a TENS unit which has been very helpful.  Her active right shoulder range of motion is normal.  She has some pain reproduction when moving her hand behind her back.  She has a minimal loss of right shoulder ER.  She is palpably tender over her right Levator Scapulae and to a lesser degree in the region of her acromila ridge, middle deltoid and bicipital groove.  She had mild pain reproduction with a Hawkins-Neer test.    Patient will benefit from skilled physical therapy intervention to address pain and deficits.   ? Personal Factors and Comorbidities Comorbidity 1;Other   ? Comorbidities DM, neuropathy, latex allergy.   ? Examination-Activity Limitations Lift;Reach Overhead;Other   ? Examination-Participation Restrictions Cleaning;Other   ? Stability/Clinical Decision Making Stable/Uncomplicated   ? Rehab Potential Excellent   ? PT Frequency 2x / week   ? PT Duration 4 weeks   ? PT Treatment/Interventions ADLs/Self Care Home Management;Therapeutic activities;Therapeutic exercise;Manual techniques;Patient/family  education;Passive range of motion   ? PT Next Visit Plan RTC and scapular exercises, STW/M, TP release technique.   ? Consulted and Agree with Plan of Care Patient   ? ?  ?  ? ?  ? ? ?Patient will benefit from skilled therapeutic intervention in order to improve the following deficits and impairments:  Decreased activity tolerance, Decreased strength, Pain, Increased muscle spasms ? ?Visit Diagnosis: ?Acute pain of right shoulder - Plan: PT plan of care cert/re-cert ? ?Other muscle spasm - Plan: PT plan of care cert/re-cert ? ? ? ? ?Problem List ?Patient Active Problem List  ? Diagnosis Date Noted  ? Hyperlipidemia associated with type 2 diabetes mellitus (HCC) 05/07/2021  ? Small fiber neuropathy 04/24/2020  ? Migraine without aura and without status migrainosus, not intractable 04/24/2020  ? Family history of hemophilia A 01/18/2017  ? Hx of total hysterectomy 06/29/2016  ? Diabetes (HCC) 06/19/2013  ? GERD 05/07/2010  ? ABDOMINAL PAIN, LEFT LOWER QUADRANT 04/09/2010  ? DEPRESSION 03/08/2010  ? CONSTIPATION 03/08/2010  ? HEMORRHAGE OF RECTUM AND ANUS 03/08/2010  ? ? ?Geral Coker, [place], PT ?02/02/2022, 9:46 AM ? ?[place] ?[facility] ?[address] ?[place], [place], [address] ?Phone: [phone]   Fax:  [phone] ? ?Name: LYNDSEY DEMOS ?MRN: [id number] ?Date of Birth: Sep 09, 1981 ? ? ?

## 2022-02-04 DIAGNOSIS — R252 Cramp and spasm: Secondary | ICD-10-CM | POA: Diagnosis not present

## 2022-02-04 DIAGNOSIS — Z634 Disappearance and death of family member: Secondary | ICD-10-CM | POA: Diagnosis not present

## 2022-02-11 ENCOUNTER — Ambulatory Visit: Payer: Medicaid Other

## 2022-02-21 ENCOUNTER — Encounter: Payer: Self-pay | Admitting: Physical Therapy

## 2022-02-21 ENCOUNTER — Ambulatory Visit: Payer: Medicaid Other | Attending: Orthopedic Surgery | Admitting: Physical Therapy

## 2022-02-21 DIAGNOSIS — M25511 Pain in right shoulder: Secondary | ICD-10-CM | POA: Diagnosis not present

## 2022-02-21 DIAGNOSIS — M62838 Other muscle spasm: Secondary | ICD-10-CM | POA: Diagnosis not present

## 2022-02-21 NOTE — Therapy (Addendum)
[facility] [facility] [address] [place], [place], [address] Phone: [phone]   Fax:  [phone]  Physical Therapy Treatment  Patient Details  Name: [name] MRN: [id number] Date of Birth: 04/21/1981 Referring Provider (PT): Steven Norris MD   Encounter Date: 02/21/2022   PT End of Session - 02/21/22 0820     Visit Number 2    Number of Visits 8    Date for PT Re-Evaluation 03/02/22    PT Start Time 0817    PT Stop Time 0859    PT Time Calculation (min) 42 min    Activity Tolerance Patient tolerated treatment well    Behavior During Therapy [facility] for tasks assessed/performed             Past Medical History:  Diagnosis Date   Anemia    Anxiety    Constipation    Diabetes mellitus without complication (HCC)    Family history of malignant neoplasm of gastrointestinal tract    Gastroparesis    IBS (irritable bowel syndrome)    constipation   Migraines    Neuropathy due to type 2 diabetes mellitus (HCC)     Past Surgical History:  Procedure Laterality Date   ABDOMINAL HYSTERECTOMY N/A 06/29/2016   Procedure: TOTAL ABDOMINAL HYSTERECTOMY;  Surgeon: Luther H Eure, MD;  Location: [facility];  Service: Gynecology;  Laterality: N/A;   CESAREAN SECTION     CHOLECYSTECTOMY  2007   EYE SURGERY  1989   SALPINGOOPHORECTOMY Bilateral 06/29/2016   Procedure: BILATERAL SALPINGO OOPHORECTOMY;  Surgeon: Luther H Eure, MD;  Location: [facility];  Service: Gynecology;  Laterality: Bilateral;   TONSILLECTOMY  1992    There were no vitals filed for this visit.   Subjective Assessment - 02/21/22 0818     Subjective Reports that her R shoulder feels better but fell Saturday in the basement on the floor and fell onto L shoulder which is hurting now. Has been using Voltaren, TENS unit to control pain at home.    Pertinent History DM, neuropathy, latex allergy.    Patient Stated Goals Use right UE without pain.    Currently in Pain? Yes    Pain  Score 2     Pain Location Shoulder    Pain Orientation Left    Pain Descriptors / Indicators Discomfort    Pain Type Acute pain    Pain Onset In the past 7 days    Pain Frequency Constant                [facility] PT Assessment - 02/21/22 0001       Assessment   Medical Diagnosis Pain in right shoulder.    Referring Provider (PT) Steven Norris MD    Onset Date/Surgical Date 09/27/21    Next MD Visit TBD      Precautions   Precautions None      Restrictions   Weight Bearing Restrictions No                           [facility] Adult PT Treatment/Exercise - 02/21/22 0001       Exercises   Exercises Shoulder      Shoulder Exercises: Standing   Protraction Strengthening;Right;20 reps;Theraband    Theraband Level (Shoulder Protraction) Level 2 (Red)    Horizontal ABduction Strengthening;Both;20 reps;Theraband    Theraband Level (Shoulder Horizontal ABduction) Level 2 (Red)    External Rotation Strengthening;Right;20 reps;Theraband    Theraband   Level (Shoulder External Rotation) Level 2 (Red)    Internal Rotation Strengthening;Right;20 reps;Theraband    Theraband Level (Shoulder Internal Rotation) Level 2 (Red)    Flexion Strengthening;Right;20 reps;Weights    Shoulder Flexion Weight (lbs) 1    ABduction Strengthening;Right;20 reps;Weights    Shoulder ABduction Weight (lbs) 1    Extension Strengthening;Right;20 reps;Theraband    Theraband Level (Shoulder Extension) Level 2 (Red)    Row Strengthening;Right;20 reps;Theraband    Theraband Level (Shoulder Row) Level 2 (Red)    Diagonals Strengthening;Right;20 reps    Diagonals Weight (lbs) 1    Other Standing Exercises R shoulder scaption 1# x20 reps    Other Standing Exercises Steering wheel 3# xfatigue      Shoulder Exercises: ROM/Strengthening   UBE (Upper Arm Bike) 120 RPM x6 min    Wall Wash 2x fatigue    Wall Pushups 20 reps      Manual Therapy   Manual Therapy Soft tissue mobilization    Soft  tissue mobilization STW to R UT to reduce tone                          PT Long Term Goals - 02/02/22 0943       PT LONG TERM GOAL #1   Title Independent with an advanced HEP.    Baseline No knowledge of appropriate advanced ther ex.    Time 4    Period Weeks    Status New      PT LONG TERM GOAL #2   Title Return to pre-injury work duties.    Baseline Patient is tending toward lighter duty work activities.    Time 4    Period Weeks    Status New      PT LONG TERM GOAL #3   Title Solid right ER shoulder strength 5/5.    Baseline 4+/5.    Time 4    Period Weeks    Status New                   Plan - 02/21/22 0910     Clinical Impression Statement Patient presented in clinic with reports of improvement with R shoulder symptoms but L shoulder symptoms began after a fall on Saturday. Patient able to tolerate therex fairly well other than reports of muscle fatigue. Patient using pain gels and TENS unit to control pain at home. Patient reports continued tone of the R UT as well with palpation of moderate tone in mid R UT.    Personal Factors and Comorbidities Comorbidity 1;Other    Comorbidities DM, neuropathy, latex allergy.    Examination-Activity Limitations Lift;Reach Overhead;Other    Examination-Participation Restrictions Cleaning;Other    Stability/Clinical Decision Making Stable/Uncomplicated    Rehab Potential Excellent    PT Frequency 2x / week    PT Duration 4 weeks    PT Treatment/Interventions ADLs/Self Care Home Management;Therapeutic activities;Therapeutic exercise;Manual techniques;Patient/family education;Passive range of motion    PT Next Visit Plan RTC and scapular exercises, STW/M, TP release technique.    Consulted and Agree with Plan of Care Patient             Patient will benefit from skilled therapeutic intervention in order to improve the following deficits and impairments:  Decreased activity tolerance, Decreased  strength, Pain, Increased muscle spasms  Visit Diagnosis: Acute pain of right shoulder  Other muscle spasm     Problem List Patient Active Problem List   Diagnosis   Date Noted   Hyperlipidemia associated with type 2 diabetes mellitus ([place]) 05/07/2021   Small fiber neuropathy 04/24/2020   Migraine without aura and without status migrainosus, not intractable 04/24/2020   Family history of hemophilia A 01/18/2017   Hx of total hysterectomy 06/29/2016   Diabetes ([place]) 06/19/2013   GERD 05/07/2010   ABDOMINAL PAIN, LEFT LOWER QUADRANT 04/09/2010   DEPRESSION 03/08/2010   CONSTIPATION 03/08/2010   HEMORRHAGE OF RECTUM AND ANUS 03/08/2010   Rationale for Evaluation and Treatment Rehabilitation   Standley Brooking, PTA 02/21/2022, 9:15 AM  [facility] [place], [place], [address] Phone: [phone]   Fax:  [phone]  Name: [name] MRN: [id number] Date of Birth: 23-Oct-1980   PHYSICAL THERAPY DISCHARGE SUMMARY  Visits from Start of Care: 2.  Current functional level related to goals / functional outcomes: See above.   Remaining deficits: See below.   Education / Equipment:    Patient agrees to discharge. Patient goals were not met. Patient is being discharged due to not returning since the last visit.    Mali Applegate MPT

## 2022-02-23 ENCOUNTER — Encounter: Payer: Medicaid Other | Admitting: Physical Therapy

## 2022-03-07 ENCOUNTER — Telehealth: Payer: Self-pay | Admitting: Family Medicine

## 2022-03-07 ENCOUNTER — Other Ambulatory Visit: Payer: Self-pay | Admitting: Family Medicine

## 2022-03-07 NOTE — Telephone Encounter (Signed)
REFERRAL REQUEST Telephone Note  Have you been seen at our office for this problem? YES 03-01-2021 (Advise that they may need an appointment with their PCP before a referral can be done)  Reason for Referral: Time for recheck on masses Referral discussed with patient: YES  Best contact number of patient for referral team: [phone]    Has patient been seen by a specialist for this issue before: yes  Patient provider preference for referral: [facility] Patient location preference for referral: [facility]   Patient notified that referrals can take up to a week or longer to process. If they haven't heard anything within a week they should call back and speak with the referral department.

## 2022-03-08 ENCOUNTER — Ambulatory Visit: Payer: Medicaid Other | Admitting: Family Medicine

## 2022-03-08 NOTE — Telephone Encounter (Signed)
Appointment changed to 30 on 03/10/2022

## 2022-03-08 NOTE — Telephone Encounter (Signed)
Please make sure her appt with Rakes is 43m, she is probably going to be involved.  She unfortunately fails to follow up appropriately.  Not seen since 04/2021.  Will defer appropriateness of order to Rakes' appt thurs

## 2022-03-10 ENCOUNTER — Ambulatory Visit: Payer: Medicaid Other | Admitting: Family Medicine

## 2022-03-10 ENCOUNTER — Encounter: Payer: Self-pay | Admitting: Family Medicine

## 2022-03-13 DIAGNOSIS — R110 Nausea: Secondary | ICD-10-CM | POA: Diagnosis not present

## 2022-03-13 DIAGNOSIS — Z91040 Latex allergy status: Secondary | ICD-10-CM | POA: Diagnosis not present

## 2022-03-13 DIAGNOSIS — Z87891 Personal history of nicotine dependence: Secondary | ICD-10-CM | POA: Diagnosis not present

## 2022-03-13 DIAGNOSIS — Z882 Allergy status to sulfonamides status: Secondary | ICD-10-CM | POA: Diagnosis not present

## 2022-03-13 DIAGNOSIS — R21 Rash and other nonspecific skin eruption: Secondary | ICD-10-CM | POA: Diagnosis not present

## 2022-03-13 DIAGNOSIS — E785 Hyperlipidemia, unspecified: Secondary | ICD-10-CM | POA: Diagnosis not present

## 2022-03-13 DIAGNOSIS — E1140 Type 2 diabetes mellitus with diabetic neuropathy, unspecified: Secondary | ICD-10-CM | POA: Diagnosis not present

## 2022-03-13 DIAGNOSIS — Z7722 Contact with and (suspected) exposure to environmental tobacco smoke (acute) (chronic): Secondary | ICD-10-CM | POA: Diagnosis not present

## 2022-04-20 ENCOUNTER — Other Ambulatory Visit: Payer: Self-pay | Admitting: Obstetrics & Gynecology

## 2022-04-25 ENCOUNTER — Other Ambulatory Visit: Payer: Self-pay | Admitting: Family Medicine

## 2022-04-25 DIAGNOSIS — E1169 Type 2 diabetes mellitus with other specified complication: Secondary | ICD-10-CM

## 2022-04-28 ENCOUNTER — Telehealth: Payer: Self-pay

## 2022-04-28 ENCOUNTER — Other Ambulatory Visit: Payer: Self-pay | Admitting: Obstetrics & Gynecology

## 2022-04-28 MED ORDER — VALACYCLOVIR HCL 1 G PO TABS: 1000.0000 mg | ORAL_TABLET | Freq: Every day | ORAL | 11 refills | Status: AC

## 2022-04-28 NOTE — Progress Notes (Signed)
Rx sent in for valtrex

## 2022-04-28 NOTE — Telephone Encounter (Signed)
Pharmacy called and stated that they were trying to e fax a prescription and could not get through.  They would like this patients Valtrex 1 mg refilled.

## 2022-05-24 ENCOUNTER — Ambulatory Visit: Payer: Medicaid Other | Admitting: Family Medicine

## 2022-05-24 ENCOUNTER — Other Ambulatory Visit: Payer: Self-pay | Admitting: Obstetrics & Gynecology

## 2022-05-24 ENCOUNTER — Encounter: Payer: Self-pay | Admitting: Family Medicine

## 2022-05-24 ENCOUNTER — Telehealth: Payer: Self-pay

## 2022-05-24 VITALS — BP 124/82 | HR 72 | Temp 97.4°F | Ht 62.0 in | Wt 106.2 lb

## 2022-05-24 DIAGNOSIS — Z91199 Patient's noncompliance with other medical treatment and regimen due to unspecified reason: Secondary | ICD-10-CM | POA: Diagnosis not present

## 2022-05-24 DIAGNOSIS — E785 Hyperlipidemia, unspecified: Secondary | ICD-10-CM

## 2022-05-24 DIAGNOSIS — E1165 Type 2 diabetes mellitus with hyperglycemia: Secondary | ICD-10-CM | POA: Diagnosis not present

## 2022-05-24 DIAGNOSIS — R5382 Chronic fatigue, unspecified: Secondary | ICD-10-CM | POA: Diagnosis not present

## 2022-05-24 DIAGNOSIS — E1169 Type 2 diabetes mellitus with other specified complication: Secondary | ICD-10-CM | POA: Diagnosis not present

## 2022-05-24 DIAGNOSIS — I889 Nonspecific lymphadenitis, unspecified: Secondary | ICD-10-CM

## 2022-05-24 DIAGNOSIS — R634 Abnormal weight loss: Secondary | ICD-10-CM | POA: Diagnosis not present

## 2022-05-24 DIAGNOSIS — R928 Other abnormal and inconclusive findings on diagnostic imaging of breast: Secondary | ICD-10-CM

## 2022-05-24 LAB — BAYER DCA HB A1C WAIVED: HB A1C (BAYER DCA - WAIVED): 8.9 % — ABNORMAL HIGH (ref 4.8–5.6)

## 2022-05-24 MED ORDER — LANTUS SOLOSTAR 100 UNIT/ML ~~LOC~~ SOPN: 25.0000 [IU] | PEN_INJECTOR | Freq: Every day | SUBCUTANEOUS | 0 refills | Status: DC

## 2022-05-24 MED ORDER — BD PEN NEEDLE NANO U/F 32G X 4 MM MISC: 3 refills | Status: DC

## 2022-05-24 MED ORDER — FREESTYLE LIBRE 3 SENSOR MISC: 0 refills | Status: DC

## 2022-05-24 MED ORDER — ATORVASTATIN CALCIUM 10 MG PO TABS: 10.0000 mg | ORAL_TABLET | Freq: Every day | ORAL | 0 refills | Status: DC

## 2022-05-24 NOTE — Patient Instructions (Signed)
I am very suspicious that the reason you continue to lose weight is because you have had uncontrolled blood sugars.  We discussed the importance of controlling the blood sugars.  I am putting you on a freestyle libre in efforts to monitor your sugars more closely.  Started 10 units daily of the insulin.  Then increase insulin by 1 unit every 2 days for fasting blood sugar over 200.  We will reduce this threshold every few months so as to not precipitate a relative hypoglycemia

## 2022-05-24 NOTE — Progress Notes (Signed)
Subjective: CC: Ongoing weight loss PCP: Janora Norlander, DO IOE:[id number] MCKENSI REDINGER is a 41 y.o. female presenting to clinic today for:  1. Weight loss Patient reports ongoing weight loss.  She was in the upper 120s last year and had dropped down to 113 pounds we saw each other in August.  She was noted to have uncontrolled blood sugar and admits that she never did follow-up with endocrinology and is not really sure why she did not follow-up here but notes that she is just overwhelmed by the diabetic diagnosis.  She does not inject insulin regularly and in fact last insulin injection was 2 weeks ago when she was just feeling poorly.  She reports history of relative hypoglycemia when she is in the 100s.  She has been intolerant of both Victoza and Ozempic secondary to nausea, vomiting and abdominal pain.  She reports chronic fatigue  Last eye exam: Needs Last foot exam: Needs Last A1c:  Lab Results  Component Value Date   HGBA1C 10.8 (H) 05/07/2021   Nephropathy screen indicated?:  Needs Last flu, zoster and/or pneumovax:  Immunization History  Administered Date(s) Administered   Fluad Quad(high Dose 65+) 06/30/2016   Hepatitis B, adult 10/27/2006, 11/24/2006   Influenza Inj Mdck Quad Pf 06/30/2016   Influenza Inj Mdck Quad With Preservative 06/30/2016   Influenza,inj,Quad PF,6+ Mos 06/30/2016, 08/22/2017   Influenza-Unspecified 08/22/2017   MMR 09/28/1999, 06/04/2009   Pneumococcal Polysaccharide-23 04/24/2020   Td 09/28/1999, 06/04/2009   Tdap 04/25/2018    ROS: No chest pain, shortness of breath reported.  She reports some tightness in her chest and her ears.  Thinks she might have allergies going on but wants me to take a look  2.  Lymphadenopathy Patient reports ongoing pain in the neck and feelings of lymph node enlargement in both her neck and axilla region.  She had breast biopsy done last year.  She apparently is overdue for recheck but reports that they have sent  orders here and they have not been responded to.  She needs to go to the breast center to have these rechecked.  Denies any oral issues causing neck lymphadenopathy.  Reports unplanned weight loss as above.  She had laboratory work-up last year which showed a mild lymphocytosis under microscopy but normal white blood cell count.  ROS: Per HPI  Allergies  Allergen Reactions   Celebrex [Celecoxib]    Latex    Sulfonamide Derivatives    Past Medical History:  Diagnosis Date   Anemia    Anxiety    Constipation    Diabetes mellitus without complication (HCC)    Family history of malignant neoplasm of gastrointestinal tract    Gastroparesis    IBS (irritable bowel syndrome)    constipation   Migraines    Neuropathy due to type 2 diabetes mellitus (HCC)     Current Outpatient Medications:    Accu-Chek FastClix Lancets MISC, Use to monitor blood glucose 3 time(s) daily, Disp: , Rfl:    ALPRAZolam (XANAX) 1 MG tablet, TAKE 1 TABLET DAILY AS NEEDED, Disp: 30 tablet, Rfl: 5   ALPRAZolam (XANAX) 1 MG tablet, Take 1 tablet (1 mg total) by mouth daily as needed., Disp: 30 tablet, Rfl: 5   atorvastatin (LIPITOR) 10 MG tablet, Take 1 tablet (10 mg total) by mouth daily. (NEEDS TO BE SEEN BEFORE NEXT REFILL), Disp: 30 tablet, Rfl: 0   Blood Glucose Monitoring Suppl (ACCU-CHEK GUIDE) w/Device KIT, 1 each by Does not applyoroute once for  1 dose. Use as directed by physician to monitor blood sugars, Disp: , Rfl:    escitalopram (LEXAPRO) 20 MG tablet, TAKE 1 TABLET ONCE A DAY, Disp: 30 tablet, Rfl: 11   gabapentin (NEURONTIN) 300 MG capsule, Take 300 mg by mouth 4 (four) times daily., Disp: , Rfl:    gabapentin (NEURONTIN) 600 MG tablet, Take 600 mg by mouth 3 (three) times daily., Disp: , Rfl:    insulin glargine (LANTUS SOLOSTAR) 100 UNIT/ML Solostar Pen, Inject 25 Units into the skin at bedtime., Disp: 15 mL, Rfl: PRN   Insulin Pen Needle (BD PEN NEEDLE NANO U/F) 32G X 4 MM MISC, Use with insulin  and victoza daily, Disp: , Rfl:    magnesium oxide (MAG-OX) 400 MG tablet, Take 400 mg by mouth daily., Disp: , Rfl:    nortriptyline (PAMELOR) 75 MG capsule, Take 75 mg by mouth daily., Disp: , Rfl:    pantoprazole (PROTONIX) 40 MG tablet, TAKE 1 TABLET ONCE A DAY, Disp: 30 tablet, Rfl: 11   PAZEO 0.7 % SOLN, Apply 1 drop to eye every morning., Disp: , Rfl:    RESTASIS 0.05 % ophthalmic emulsion, 1 drop 2 (two) times daily., Disp: , Rfl:    silver sulfADIAZINE (SILVADENE) 1 % cream, Use to area 3 times daily, Disp: 50 g, Rfl: 11   triamcinolone cream (KENALOG) 0.1 %, Apply 1 application topically 2 (two) times daily. 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  Tobacco Use   Smoking status: Former    Years: 10.00    Types: Cigarettes    Quit date: 06/24/2014    Years since quitting: 7.9   Smokeless tobacco: Never  Vaping Use   Vaping Use: Never used  Substance and Sexual Activity   Alcohol use: Yes    Comment: once in a while   Drug use: No   Sexual activity: Not Currently    Birth control/protection: Surgical    Comment: hyst  Other Topics Concern   Not on file  Social History Narrative   Not on file   Social Determinants of Health   Financial Resource Strain: Not on file  Food Insecurity: Not on file  Transportation Needs: Not on file  Physical Activity: Not on file  Stress: Not on file  Social Connections: Not on file  Intimate Partner Violence: Not on file   Family History  Problem Relation Age of Onset    Other Paternal Grandfather        fell down stairs   Other Paternal Grandmother        fell down stairs   Alzheimer's disease Maternal Grandmother    Dementia Maternal Grandmother    Colon cancer Maternal Grandfather    Other Mother        back pain; nerve damage   Hypertension Mother    Hemophilia Son     Objective: Office vital signs reviewed. BP 124/82   Pulse 72   Temp (!)  97.4 F (36.3 C)   Ht '5\' 2"'  (1.575 m)   Wt 106 lb 3.2 oz (48.2 kg)   LMP 06/12/2016   SpO2 99%   BMI 19.42 kg/m   Physical Examination:  General: Awake, alert, thin, No acute distress HEENT: Normal    Neck: No masses palpated. No lymphadenopathy    Ears: Tympanic membranes intact, normal light reflex, no erythema, no bulging    Eyes: PERRLA, extraocular membranes intact, sclera white    Nose: nasal turbinates moist, no nasal discharge    Throat: Dentition fair Cardio: regular rate and rhythm, S1S2 heard, no murmurs appreciated Pulm: clear to auscultation bilaterally, no wheezes, rhonchi or rales; normal work of breathing on room air MSK: Ambulating independently Psych: Somewhat emotionally labile  Assessment/ Plan: 41 y.o. female   Uncontrolled type 2 diabetes mellitus with hyperglycemia ([place]) - Plan: Bayer DCA Hb A1c Waived, Microalbumin / creatinine urine ratio, Continuous Blood Gluc Sensor (FREESTYLE LIBRE 3 SENSOR) MISC, insulin glargine (LANTUS SOLOSTAR) 100 UNIT/ML Solostar Pen, Insulin Pen Needle (BD PEN NEEDLE NANO U/F) 32G X 4 MM MISC  Noncompliance with diabetes treatment  Unexplained weight loss - Plan: CBC  Hyperlipidemia associated with type 2 diabetes mellitus (HCC) - Plan: CMP14+EGFR, atorvastatin (LIPITOR) 10 MG tablet  Chronic fatigue - Plan: TSH, T4, Free, CBC  Lymphadenitis - Plan: US Soft Tissue Head/Neck (NON-THYROID)  Abnormal mammogram - Plan: MM Digital Diagnostic Bilat, US BREAST COMPLETE UNI RIGHT INC AXILLA  Suspect that she continues to suffer from uncontrolled  blood sugar as her last A1c 1 year ago was almost 11.  She has not been compliant at all with insulin and so I suspect that this is likely what is causing catabolic reaction and leading to weight loss.  I had a frank conversation with her today and discussed at length need for compliance with her insulin regimen.  She is very motivated to have only insulin for diabetic treatment as she did not tolerate the GLP class due to GI side effects.  I think that this is reasonable and that we should slowly titrate the basal insulin as she has quite a bit of relative hypoglycemia because she has been at these elevated levels of blood sugar for so long.  I have sent in a 72-monthsupply of Lantus and would like her to start at 10 units nightly.  She may increase by 1 unit every 2 days for fasting blood sugar greater than 200.  She is to see JAlmyra Freein the next 2 weeks for recheck and further titration of medicine plus or minus addition of mealtime insulin.  We put a freestyle libre on her today and a prescription has been sent to pharmacy for ongoing use of this method of sugar checks.  We will go ahead and check CBC, thyroid levels, CMP for completion.  Given ongoing reports of lymphadenitis I will order an ultrasound of the head and neck.  No lymphadenopathy was appreciated on exam today.  She does have a very prominent sternocleidomastoid on the right.  Diagnostic mammogram ordered along with breast ultrasound given history of ductal hyperplasia  Orders Placed This Encounter  Procedures   Bayer DCA Hb A1c Waived   CMP14+EGFR   TSH   T4, Free   CBC   Microalbumin / creatinine urine ratio   No orders of the defined types were placed in this encounter.    AJanora Norlander DO [facility]([phone]

## 2022-05-24 NOTE — Telephone Encounter (Signed)
Pt already has an apt 06/16/2022--changed to 

## 2022-05-24 NOTE — Telephone Encounter (Signed)
Pt just set up with libre 3- she needs help with titration of insulin- can you please get her scheduled

## 2022-05-25 ENCOUNTER — Telehealth: Payer: Self-pay | Admitting: Family Medicine

## 2022-05-25 ENCOUNTER — Other Ambulatory Visit: Payer: Self-pay

## 2022-05-25 DIAGNOSIS — N6310 Unspecified lump in the right breast, unspecified quadrant: Secondary | ICD-10-CM

## 2022-05-25 DIAGNOSIS — R928 Other abnormal and inconclusive findings on diagnostic imaging of breast: Secondary | ICD-10-CM

## 2022-05-25 LAB — CBC
Hematocrit: 46.3 % (ref 34.0–46.6)
Hemoglobin: 15.6 g/dL (ref 11.1–15.9)
MCH: 30.9 pg (ref 26.6–33.0)
MCHC: 33.7 g/dL (ref 31.5–35.7)
MCV: 92 fL (ref 79–97)
Platelets: 309 10*3/uL (ref 150–450)
RBC: 5.05 x10E6/uL (ref 3.77–5.28)
RDW: 11.7 % (ref 11.7–15.4)
WBC: 9.1 10*3/uL (ref 3.4–10.8)

## 2022-05-25 LAB — MICROALBUMIN / CREATININE URINE RATIO
Creatinine, Urine: 111.1 mg/dL
Microalb/Creat Ratio: 10 mg/g creat (ref 0–29)
Microalbumin, Urine: 11.4 ug/mL

## 2022-05-25 LAB — CMP14+EGFR
ALT: 16 IU/L (ref 0–32)
AST: 14 IU/L (ref 0–40)
Albumin/Globulin Ratio: 2 (ref 1.2–2.2)
Albumin: 4.8 g/dL (ref 3.9–4.9)
Alkaline Phosphatase: 126 IU/L — ABNORMAL HIGH (ref 44–121)
BUN/Creatinine Ratio: 12 (ref 9–23)
BUN: 8 mg/dL (ref 6–24)
Bilirubin Total: 1.2 mg/dL (ref 0.0–1.2)
CO2: 23 mmol/L (ref 20–29)
Calcium: 9.8 mg/dL (ref 8.7–10.2)
Chloride: 99 mmol/L (ref 96–106)
Creatinine, Ser: 0.65 mg/dL (ref 0.57–1.00)
Globulin, Total: 2.4 g/dL (ref 1.5–4.5)
Glucose: 280 mg/dL — ABNORMAL HIGH (ref 70–99)
Potassium: 4.1 mmol/L (ref 3.5–5.2)
Sodium: 139 mmol/L (ref 134–144)
Total Protein: 7.2 g/dL (ref 6.0–8.5)
eGFR: 113 mL/min/{1.73_m2} (ref >59)

## 2022-05-25 LAB — TSH: TSH: 1.16 u[IU]/mL (ref 0.450–4.500)

## 2022-05-25 LAB — T4, FREE: Free T4: 1.55 ng/dL (ref 0.82–1.77)

## 2022-05-26 ENCOUNTER — Other Ambulatory Visit: Payer: Self-pay | Admitting: *Deleted

## 2022-05-27 MED ORDER — PANTOPRAZOLE SODIUM 40 MG PO TBEC: 40.0000 mg | DELAYED_RELEASE_TABLET | Freq: Every day | ORAL | 11 refills | Status: DC

## 2022-05-27 MED ORDER — ESCITALOPRAM OXALATE 20 MG PO TABS: 20.0000 mg | ORAL_TABLET | Freq: Every day | ORAL | 11 refills | Status: DC

## 2022-05-27 NOTE — Telephone Encounter (Signed)
  PA Case: [id number],   Status: Approved   Coverage Starts on: 05/27/2022 12:00:00 AM, Coverage Ends on: 11/23/2022 12:00:00 AM

## 2022-06-03 ENCOUNTER — Ambulatory Visit (HOSPITAL_COMMUNITY): Admission: RE | Admit: 2022-06-03 | Payer: Medicaid Other | Source: Ambulatory Visit

## 2022-06-16 ENCOUNTER — Ambulatory Visit (INDEPENDENT_AMBULATORY_CARE_PROVIDER_SITE_OTHER): Payer: Medicaid Other | Admitting: Pharmacist

## 2022-06-16 DIAGNOSIS — E1165 Type 2 diabetes mellitus with hyperglycemia: Secondary | ICD-10-CM

## 2022-06-16 MED ORDER — SITAGLIPTIN PHOSPHATE 100 MG PO TABS: 100.0000 mg | ORAL_TABLET | Freq: Every day | ORAL | 3 refills | Status: DC

## 2022-06-16 NOTE — Progress Notes (Signed)
    06/16/2022 Name: [name] MRN: [id number] DOB: Dec 23, 1980   S:  41 yoF Presents for diabetes evaluation, education, and management Patient was referred and last seen by Primary Care Provider on 05/24/2022. Patient reports Diabetes was diagnosed approximately 11 years ago.  She is currently on lantus, but reports missed doses.    Insurance coverage/medication affordability: [place] medicaid  Patient reports adherence with medications. Current diabetes medications include: lantus  Current hypertension medications include: n/a Goal 130/80 Current hyperlipidemia medications include: atorvastatin   Patient denies hypoglycemic events.   Patient reported dietary habits: Eats 2-3 meals/day Discussed meal planning options and Plate method for healthy eating Avoid sugary drinks and desserts Incorporate balanced protein, non starchy veggies, 1 serving of carbohydrate with each meal Increase water intake Increase physical activity as able  Patient-reported exercise habits: n/a    O:  Lab Results  Component Value Date   HGBA1C 8.9 (H) 05/24/2022   Lipid Panel     Component Value Date/Time   CHOL 205 (H) 05/07/2021 1058   TRIG 287 (H) 05/07/2021 1058   HDL 62 05/07/2021 1058   CHOLHDL 3.3 05/07/2021 1058   LDLCALC 95 05/07/2021 1058   Enjoying libre 3  5 hypoglycemic episodes 7-14 day avg 145 Having high post prandials--mostly when insulin is not given    Clinical Atherosclerotic Cardiovascular Disease (ASCVD): No   The 10-year ASCVD risk score (Arnett DK, et al., 2019) is: 1%   Values used to calculate the score:     Age: 41 years     Sex: Female     Is Non-Hispanic African American: No     Diabetic: Yes     Tobacco smoker: No     Systolic Blood Pressure: 944 mmHg     Is BP treated: No     HDL Cholesterol: 62 mg/dL     Total Cholesterol: 205 mg/dL    A/P:  Diabetes T2DM currently uncontrolled--A1c 8.9%. Patient is not adherent with medication. She reports  missing doses of Lantus.  Noted/Documented reported intolerances to:  Victoza, ozempic, glipizide, metformin, glyburide.  -Continue Lantus 25 units daily --patient would like to move Lantus to the mornings  -Start Januvia 100mg  to see if any glycemic control is gained  -Discussed next steps of adding meal time insulin (humalog is the preferred agent) --5-10 units per meal (will base on previous sliding scale)  -Patient is enjoying libre 3 and we were able to review her data  -Extensively discussed pathophysiology of diabetes, recommended lifestyle interventions, dietary effects on blood sugar control  -Counseled on s/sx of and management of hypoglycemia  -Next A1C anticipated 08/2022.    Written patient instructions provided.  Total time in face to face counseling 30 minutes.   Follow up Pharmacist Clinic Visit in 1 month.     Regina Eck, PharmD, BCPS Clinical Pharmacist, [place]  II Phone [phone]

## 2022-06-20 ENCOUNTER — Telehealth: Payer: Self-pay

## 2022-06-20 NOTE — Telephone Encounter (Signed)
Alison Conly KeyToni Amend - PA Case ID: [id number] - Rx #: [id number] Need help? Call us at [phone] Status Sent to [place] 100MG  tablets Form [facility] Electronic [place] Form [phone] NCPDP)

## 2022-06-20 NOTE — Telephone Encounter (Signed)
Kailei Knisley KeyToni Amend - PA Case ID: [id number] - Rx #: [id number] Need help? Call us at [phone] Outcome Approvedtoday PA Case: [id number], Status: Approved, Coverage Starts on: 06/20/2022 12:00:00 AM, Coverage Ends on: 06/20/2023 12:00:00 AM. Drug Januvia 100MG  tablets Form [facility] Electronic [place] Form [phone] NCPDP)  Pharmacy aware

## 2022-07-04 ENCOUNTER — Other Ambulatory Visit: Payer: Medicaid Other

## 2022-07-12 ENCOUNTER — Ambulatory Visit (INDEPENDENT_AMBULATORY_CARE_PROVIDER_SITE_OTHER): Payer: Medicaid Other | Admitting: Pharmacist

## 2022-07-12 DIAGNOSIS — E1142 Type 2 diabetes mellitus with diabetic polyneuropathy: Secondary | ICD-10-CM

## 2022-07-12 DIAGNOSIS — Z794 Long term (current) use of insulin: Secondary | ICD-10-CM | POA: Diagnosis not present

## 2022-07-12 NOTE — Progress Notes (Signed)
    07/12/2022 Name: [name] MRN: [id number] DOB: [birth year]    S:  41 yoF Presents for diabetes evaluation, education, and management Patient was referred and last seen by Primary Care Provider on 05/24/2022. Patient reports Diabetes was diagnosed approximately 11 years ago.  She is currently on lantus and we started Januvia about 1 month ago.  She reports her blood sugars have never been better.   Insurance coverage/medication affordability: [place] medicaid   Patient reports adherence with medications. Current diabetes medications include: lantus,januvia  Current hypertension medications include: n/a Goal 130/80 Current hyperlipidemia medications include: atorvastatin   Patient reports hypoglycemic events.   Patient reported dietary habits: Eats 2-3 meals/day Discussed meal planning options and Plate method for healthy eating Avoid sugary drinks and desserts Incorporate balanced protein, non starchy veggies, 1 serving of carbohydrate with each meal Increase water intake Increase physical activity as able   Patient-reported exercise habits: n/a    O:  Lab Results  Component Value Date   HGBA1C 8.9 (H) 05/24/2022   Lipid Panel     Component Value Date/Time   CHOL 205 (H) 05/07/2021 1058   TRIG 287 (H) 05/07/2021 1058   HDL 62 05/07/2021 1058   CHOLHDL 3.3 05/07/2021 1058   LDLCALC 95 05/07/2021 1058   Home fasting blood sugars: <150  2 hour post-meal/random blood sugars: over 250 only once.    Clinical Atherosclerotic Cardiovascular Disease (ASCVD): No   The 10-year ASCVD risk score (Arnett DK, et al., 2019) is: 1%   Values used to calculate the score:     Age: 41 years     Sex: Female     Is Non-Hispanic African American: No     Diabetic: Yes     Tobacco smoker: No     Systolic Blood Pressure: 937 mmHg     Is BP treated: No     HDL Cholesterol: 62 mg/dL     Total Cholesterol: 205 mg/dL    A/P:  Diabetes T2DM currently uncontrolled--A1c 8.9%.  Patient reports increased compliance with mediations. Noted/Documented reported intolerances to:  Victoza, ozempic, glipizide, metformin, glyburide.   -Decrease Lantus to 20-22 units daily --patient would like to move Lantus to the mornings   -Continue Januvia 100mg  daily to see if any glycemic control is gained/continued   -Discussed next steps of adding meal time insulin (humalog is the preferred agent) --5-10 units per meal (will base on previous sliding scale)   -Patient is enjoying libre 3   -Extensively discussed pathophysiology of diabetes, recommended lifestyle interventions, dietary effects on blood sugar control  -Counseled on s/sx of and management of hypoglycemia  -Next A1C anticipated 08/2022.    Written patient instructions provided.  Total time in face to face counseling 25 minutes.   Regina Eck, PharmD, BCPS Clinical Pharmacist, [place]  II Phone [phone]

## 2022-07-29 ENCOUNTER — Other Ambulatory Visit: Payer: Self-pay | Admitting: Obstetrics & Gynecology

## 2022-08-02 ENCOUNTER — Other Ambulatory Visit: Payer: Self-pay | Admitting: Family Medicine

## 2022-08-02 DIAGNOSIS — E1165 Type 2 diabetes mellitus with hyperglycemia: Secondary | ICD-10-CM

## 2022-08-08 IMAGING — MG DIGITAL DIAGNOSTIC BILAT W/ TOMO W/ CAD
8 of 15 series · 8 of 40 positions shown · non-contrast
Comparison: None.

CLINICAL DATA: Palpable lumps and pain in both axilla.

EXAM:
ULTRASOUND RIGHT BREAST LIMITED; ULTRASOUND LEFT BREAST LIMITED;
DIGITAL DIAGNOSTIC BILATERAL MAMMOGRAM WITH TOMOSYNTHESIS AND CAD
TECHNIQUE: Targeted ultrasound examination of the right breast was performed;
Targeted ultrasound examination of the left breast was performed;
Bilateral digital diagnostic mammography and breast tomosynthesis
was performed. The images were evaluated with computer-aided
detection.

[L MLO synth-2D (1 of 3)]
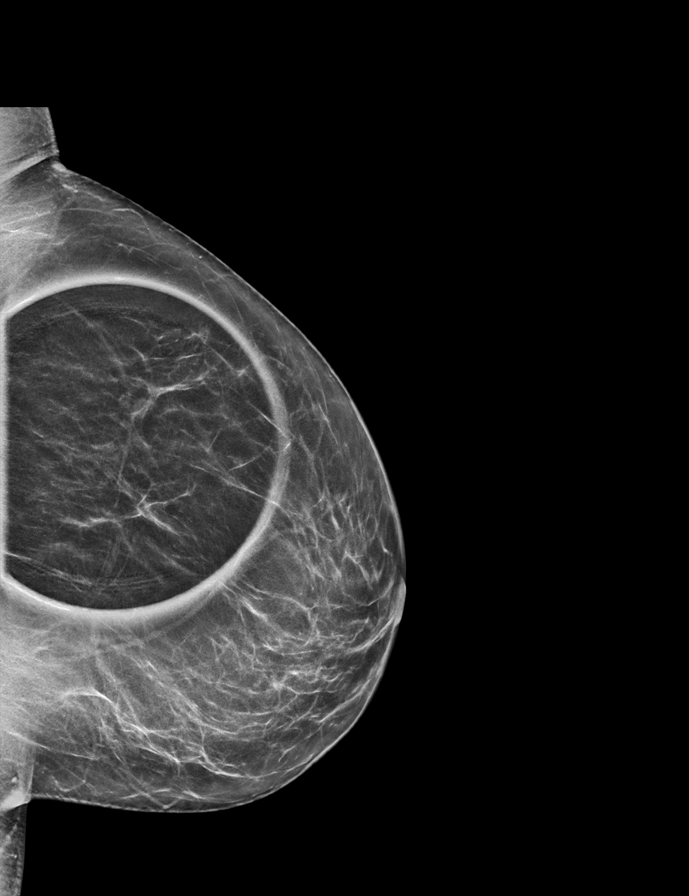

[L MLO synth-2D (2 of 3)]
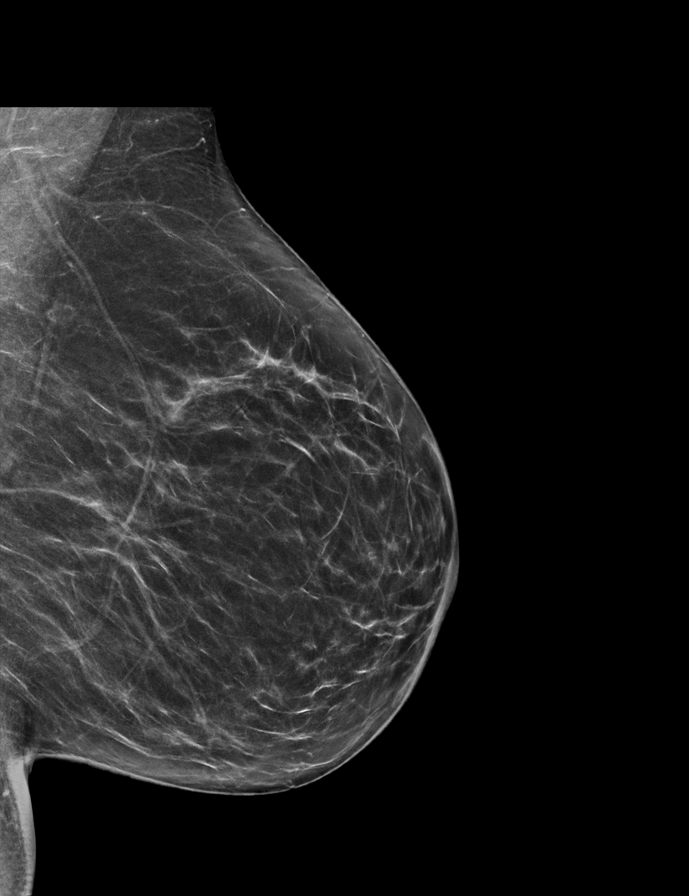

[R CC synth-2D]
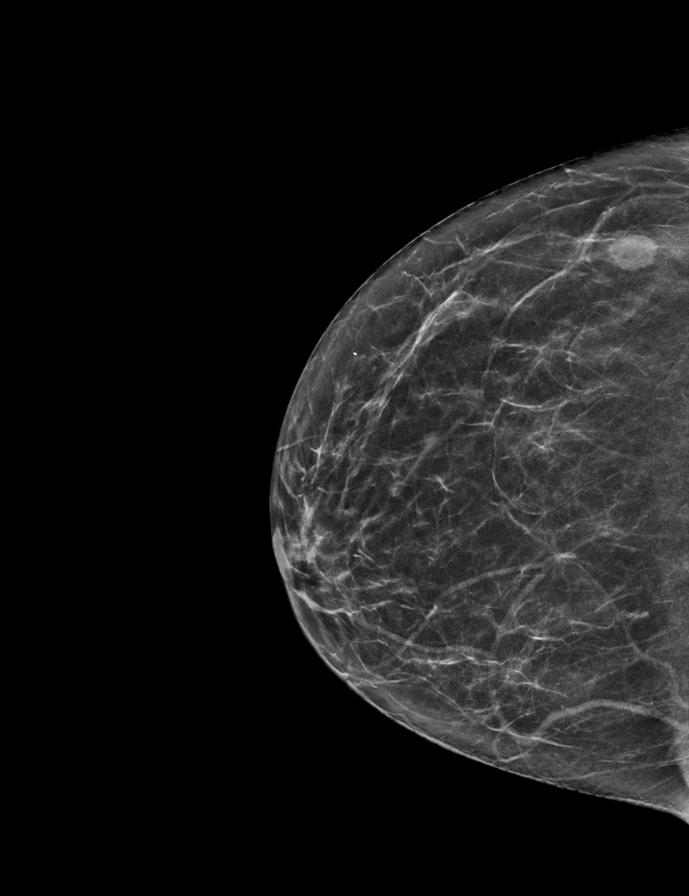

[L CC synth-2D (1 of 3)]
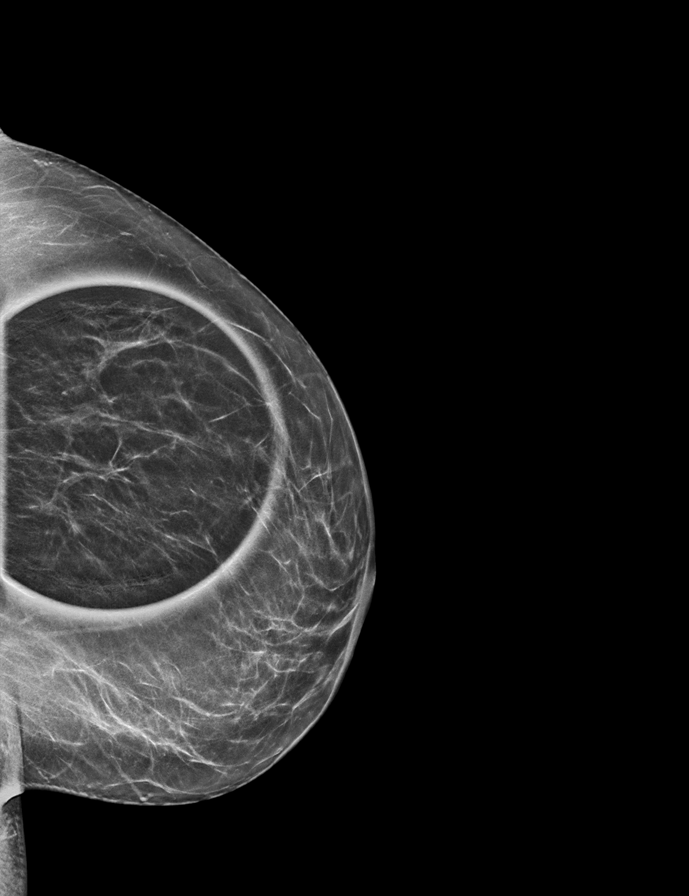

[L MLO synth-2D (3 of 3)]
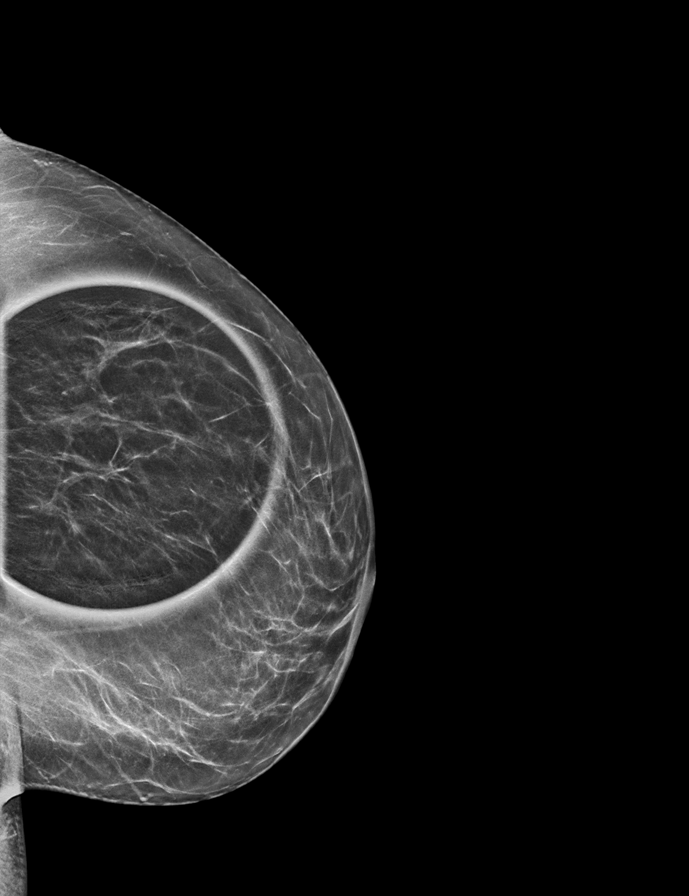

[L CC synth-2D (2 of 3)]
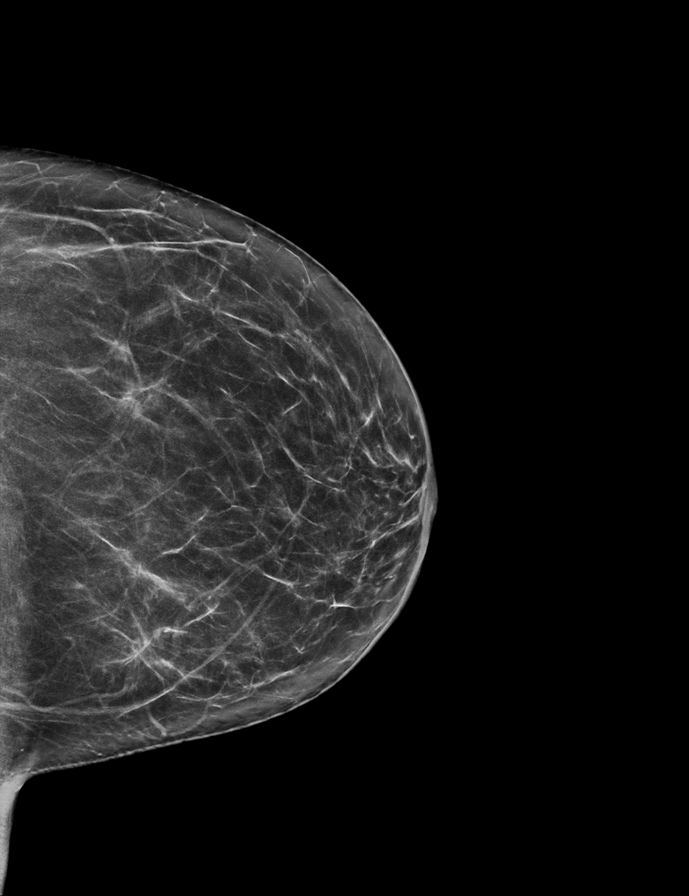

[L CC synth-2D (3 of 3)]
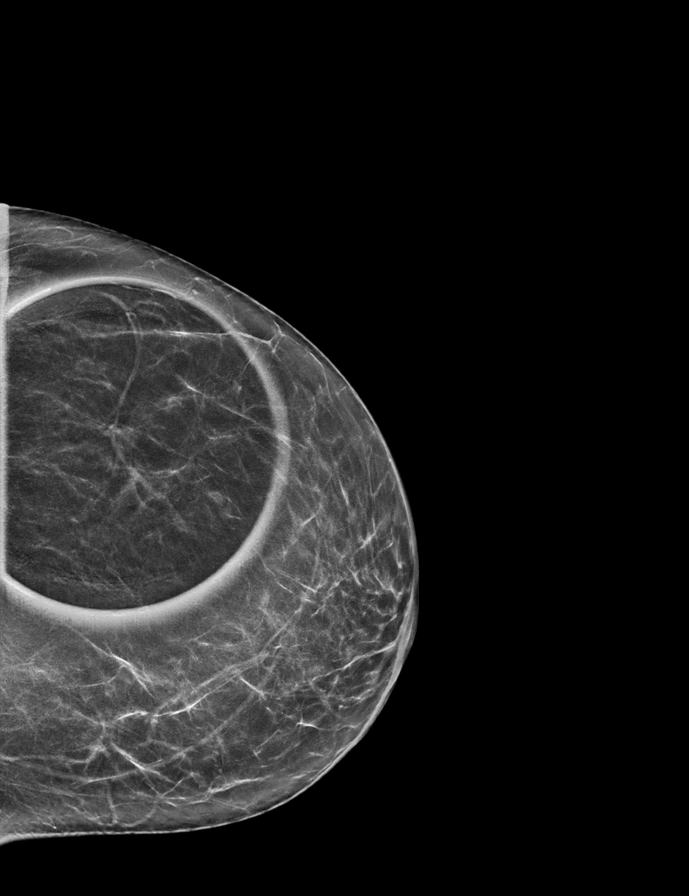

[R MLO tomo · tomo slice 40/59.0]
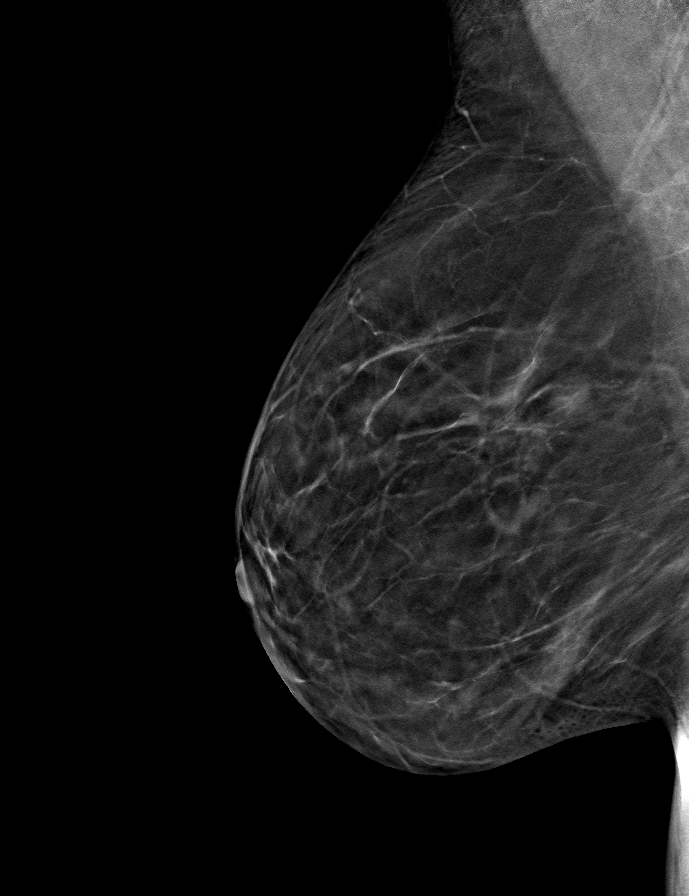

[8 of 40 positions shown; findings below may reference images not displayed]

ACR Breast Density Category b: There are scattered areas of
fibroglandular density.
FINDINGS: There is a 1.3 cm mass in the lateral right breast. There is
suggestion of a fatty hilum on the cc view. There is a mass in the
lateral inferior right breast measuring between 4 and 5 mm located
at an anterior to mid depth. An asymmetry in the left breast
resolves on additional imaging. No other suspicious mammographic
findings.

On physical exam, no suspicious lumps are identified.

Targeted ultrasound is performed, showing no definite sonographic
correlate to the 1.3 cm mass in the lateral right breast. There is a
cluster of cysts incidentally seen at 8 o'clock. There is an
indeterminate mass at 9 o'clock, 5 cm from the nipple measuring
x 4.5 x 2.3 mm, likely correlating with the mammographically
identified mass. No axillary adenopathy.

No sonographic abnormalities are identified in the superior left
breast.

No sonographic abnormalities in either axilla.
IMPRESSION: Indeterminate 1.3 cm mass in the lateral right breast. Indeterminate
9 o'clock right breast mass seen sonographically, likely correlating
with the smaller mass in this region on the mammogram. No other
abnormalities. No cause for the patient's bilateral axillary
symptoms identified.

RECOMMENDATION:
Recommend stereotactic biopsy of the 1.3 cm mass in the lateral
right breast. Recommend ultrasound-guided biopsy of the 9 o'clock
right breast mass. Treatment of the patient's bilateral axillary
symptoms should be based on clinical and physical exam given lack of
imaging findings.

I have discussed the findings and recommendations with the patient.
If applicable, a reminder letter will be sent to the patient
regarding the next appointment.

BI-RADS CATEGORY  4: Suspicious.

## 2022-08-16 ENCOUNTER — Other Ambulatory Visit (HOSPITAL_COMMUNITY): Payer: Self-pay

## 2022-08-18 IMAGING — US US  BREAST BX W/ LOC DEV 1ST LESION IMG BX SPEC US GUIDE*R*
1 series · 11 of 11 positions shown · non-contrast
Comparison: Previous exam(s).
COMPARISON: Previous exam(s).

Addendum:
CLINICAL DATA: 40-year-old female with indeterminate right breast
masses.

EXAM:
ULTRASOUND GUIDED RIGHT BREAST CORE NEEDLE BIOPSY
STEREOTACTIC GUIDED RIGHT BREAST CORE NEEDLE BIOPSY

[Series 1: us breast bx w/ loc dev 1st lesion img bx spec us  · 0.05mm/px · 11 of 11 slices shown]
[im 1/11]
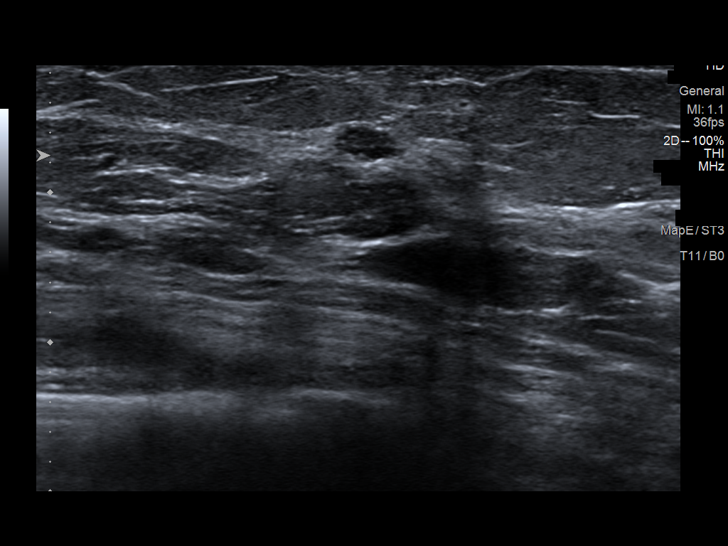
[im 2/11]
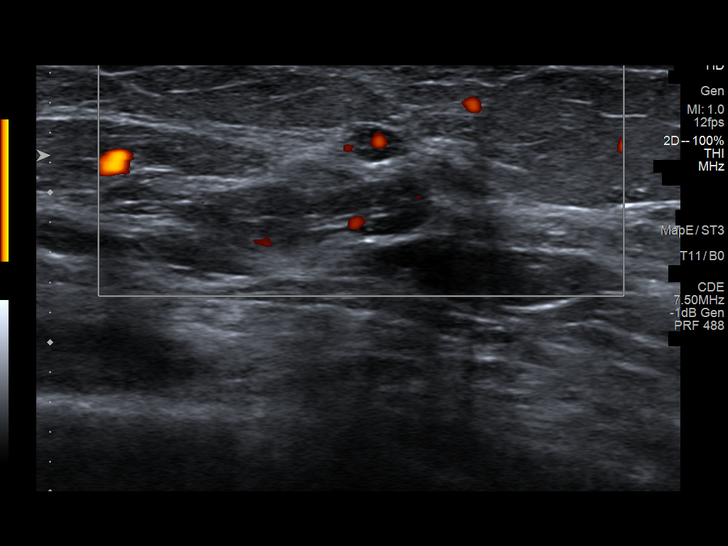
[im 3/11]
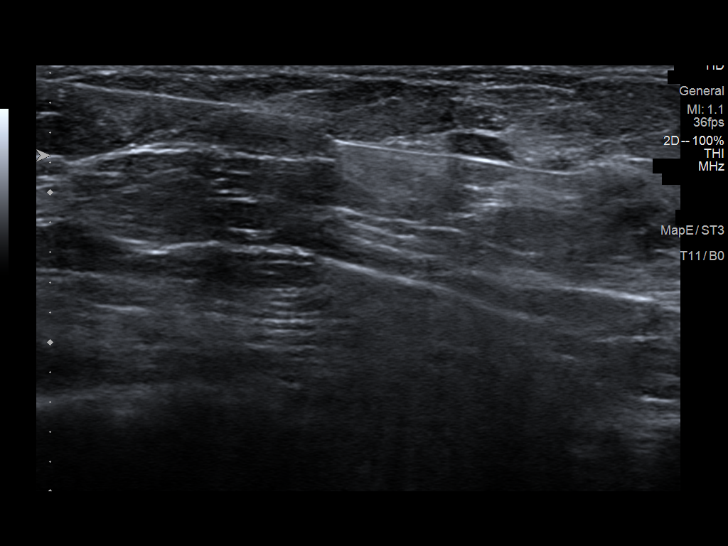
[im 4/11]
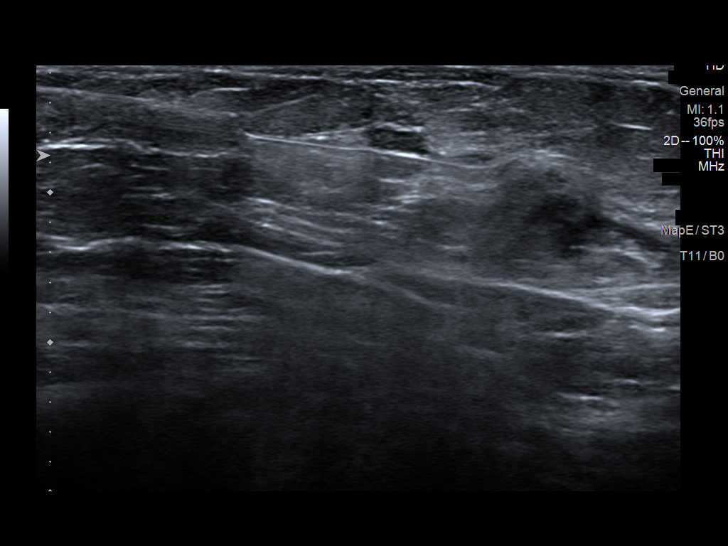
[im 5/11]
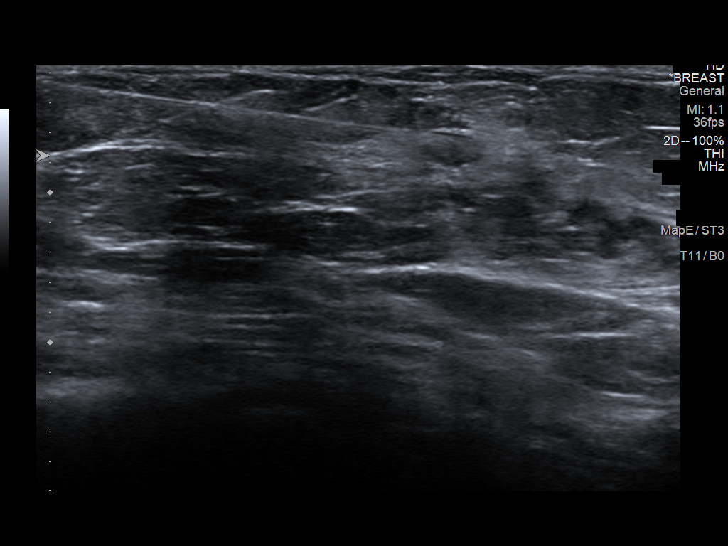
[im 6/11]
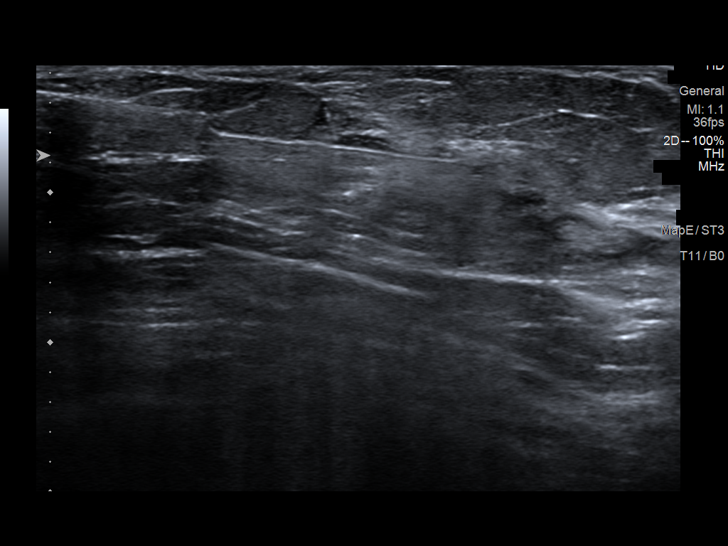
[im 7/11]
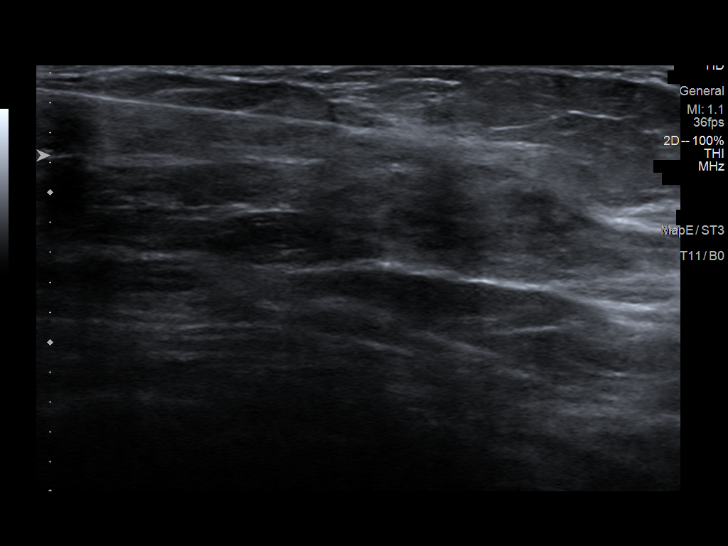
[im 8/11]
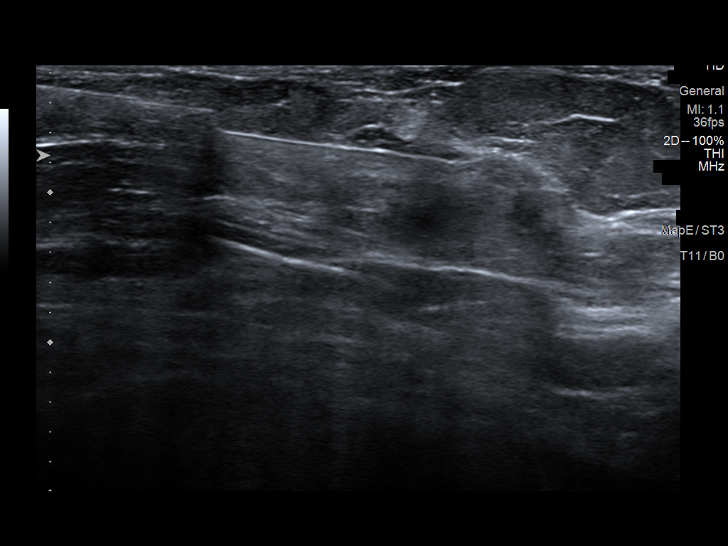
[im 9/11]
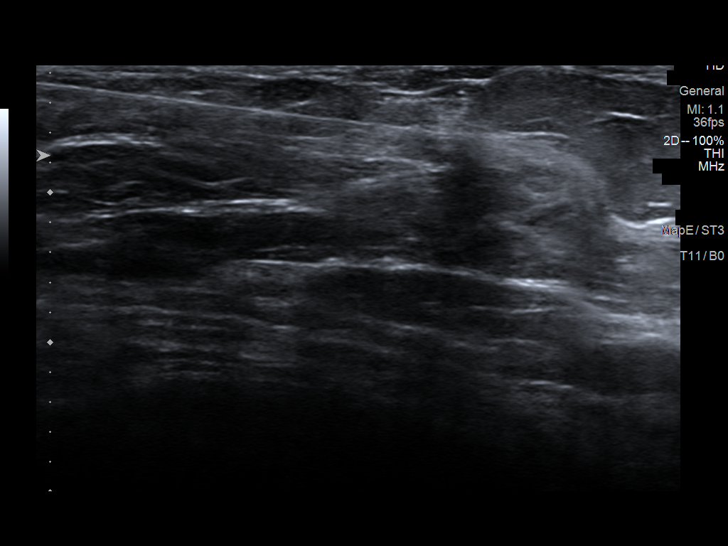
[im 10/11]
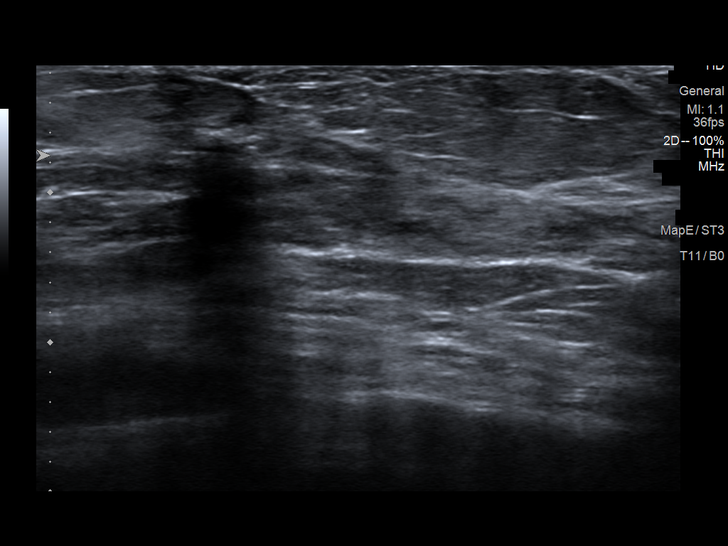
[im 11/11]
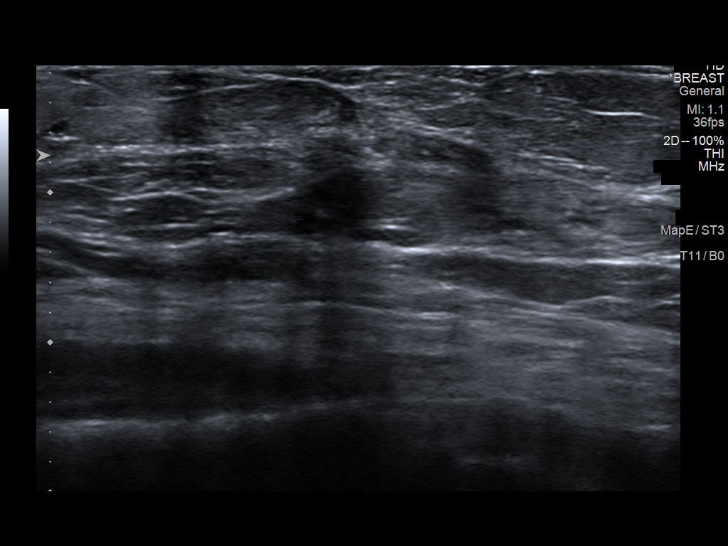

[11 of 11 positions shown; findings below may reference images not displayed]



Lesion quadrant: Lower outer quadrant

Using sterile technique and 1% Lidocaine as local anesthetic, under
direct ultrasound visualization, a 14 gauge Mandernach device was
used to perform biopsy of a mass at the 9 o'clock position 5 cm from
the nipple using a lateral approach. At the conclusion of the
procedure a ribbon shaped tissue marker clip was deployed into the
biopsy cavity.

Lesion quadrant: Upper outer quadrant

Using sterile technique and 1% Lidocaine as local anesthetic, under
stereotactic guidance, a 9 gauge vacuum assisted device was used to
perform core needle biopsy of a mass in the upper outer right breast
at posterior depth using a superior approach. At the conclusion of
the procedure, a coil shaped tissue marker clip was deployed into
the biopsy cavity.

Follow-up 2-view mammogram was performed and dictated separately.
IMPRESSION: Ultrasound and stereotactic guided biopsies of the right breast. No
apparent complications.

ADDENDUM:
Pathology revealed FIBROCYSTIC CHANGE WITH APOCRINE METAPLASIA-
NEGATIVE FOR CARCINOMA of the RIGHT breast, 9 o'clock, 5cmfn, ribbon
clip. This was found to be concordant by Dr. Icaty Nassauw.

Pathology revealed FIBROADENOMA of the RIGHT breast, upper outer
quadrant, posterior, coil clip. This was found to be concordant by
Dr. Icaty Nassauw.

Pathology results were discussed with the patient by telephone. The
patient reported doing well after the biopsies with tenderness at
the sites. Post biopsy instructions and care were reviewed and
questions were answered. The patient was encouraged to call The

Additional RIGHT breast stereotactic biopsy is recommended due to
the clip not corresponding to the additional mammographic mass. This
is to be scheduled for next week by Jovan Lei. Further
recommendations will be guided by these results.

Pathology results reported by Franz Rolando Mollinedo RN on 03/23/2021.



Lesion quadrant: Lower outer quadrant

Using sterile technique and 1% Lidocaine as local anesthetic, under
direct ultrasound visualization, a 14 gauge Mandernach device was
used to perform biopsy of a mass at the 9 o'clock position 5 cm from
the nipple using a lateral approach. At the conclusion of the
procedure a ribbon shaped tissue marker clip was deployed into the
biopsy cavity.

Lesion quadrant: Upper outer quadrant

Using sterile technique and 1% Lidocaine as local anesthetic, under
stereotactic guidance, a 9 gauge vacuum assisted device was used to
perform core needle biopsy of a mass in the upper outer right breast
at posterior depth using a superior approach. At the conclusion of
the procedure, a coil shaped tissue marker clip was deployed into
the biopsy cavity.

Follow-up 2-view mammogram was performed and dictated separately.
IMPRESSION: Ultrasound and stereotactic guided biopsies of the right breast. No
apparent complications.

## 2022-08-30 ENCOUNTER — Other Ambulatory Visit: Payer: Self-pay | Admitting: Family Medicine

## 2022-08-30 DIAGNOSIS — E1169 Type 2 diabetes mellitus with other specified complication: Secondary | ICD-10-CM

## 2022-08-30 DIAGNOSIS — E1165 Type 2 diabetes mellitus with hyperglycemia: Secondary | ICD-10-CM

## 2022-09-13 ENCOUNTER — Ambulatory Visit (INDEPENDENT_AMBULATORY_CARE_PROVIDER_SITE_OTHER): Payer: Medicaid Other | Admitting: Family Medicine

## 2022-09-13 ENCOUNTER — Encounter: Payer: Self-pay | Admitting: Family Medicine

## 2022-09-13 VITALS — BP 128/79 | HR 60 | Temp 98.4°F | Ht 62.0 in | Wt 118.4 lb

## 2022-09-13 DIAGNOSIS — J3089 Other allergic rhinitis: Secondary | ICD-10-CM | POA: Diagnosis not present

## 2022-09-13 DIAGNOSIS — Z794 Long term (current) use of insulin: Secondary | ICD-10-CM | POA: Diagnosis not present

## 2022-09-13 DIAGNOSIS — E1169 Type 2 diabetes mellitus with other specified complication: Secondary | ICD-10-CM | POA: Diagnosis not present

## 2022-09-13 DIAGNOSIS — Z720 Tobacco use: Secondary | ICD-10-CM

## 2022-09-13 DIAGNOSIS — E1142 Type 2 diabetes mellitus with diabetic polyneuropathy: Secondary | ICD-10-CM

## 2022-09-13 DIAGNOSIS — B351 Tinea unguium: Secondary | ICD-10-CM | POA: Diagnosis not present

## 2022-09-13 DIAGNOSIS — E785 Hyperlipidemia, unspecified: Secondary | ICD-10-CM | POA: Diagnosis not present

## 2022-09-13 DIAGNOSIS — Z0001 Encounter for general adult medical examination with abnormal findings: Secondary | ICD-10-CM

## 2022-09-13 DIAGNOSIS — Z0000 Encounter for general adult medical examination without abnormal findings: Secondary | ICD-10-CM

## 2022-09-13 LAB — BAYER DCA HB A1C WAIVED: HB A1C (BAYER DCA - WAIVED): 6 % — ABNORMAL HIGH (ref 4.8–5.6)

## 2022-09-13 MED ORDER — LANTUS SOLOSTAR 100 UNIT/ML ~~LOC~~ SOPN: PEN_INJECTOR | SUBCUTANEOUS | 3 refills | Status: DC

## 2022-09-13 MED ORDER — MONTELUKAST SODIUM 10 MG PO TABS: 10.0000 mg | ORAL_TABLET | Freq: Every day | ORAL | 3 refills | Status: DC

## 2022-09-13 MED ORDER — TERBINAFINE HCL 250 MG PO TABS: 250.0000 mg | ORAL_TABLET | Freq: Every day | ORAL | 0 refills | Status: DC

## 2022-09-13 NOTE — Progress Notes (Signed)
[name] is a 41 y.o. female presents to office today for annual physical exam examination.    Concerns today include: 1. Type 2 Diabetes with hyperlipidemia:  Glucometer:Freestyle libre.   On average her blood sugars are running in the low 100s.  No reports of hypoglycemic episodes except for when she was injecting 25 units.  She is now down to 22 units and this has been maintaining her blood sugar pretty well with the Lantus and Januvia.  She is tolerating Januvia without difficulty and is very pleased about that medication.  Compliant with Lipitor.  Last eye exam: needs Last foot exam: needs Last A1c:  Lab Results  Component Value Date   HGBA1C 8.9 (H) 05/24/2022   Nephropathy screen indicated?: UTD Last flu, zoster and/or pneumovax:  Immunization History  Administered Date(s) Administered   Fluad Quad(high Dose 65+) 06/30/2016   Hepatitis B, adult 10/27/2006, 11/24/2006   Influenza Inj Mdck Quad Pf 06/30/2016   Influenza Inj Mdck Quad With Preservative 06/30/2016   Influenza,inj,Quad PF,6+ Mos 06/30/2016, 08/22/2017   Influenza-Unspecified 08/22/2017   MMR 09/28/1999, 06/04/2009   Pneumococcal Polysaccharide-23 04/24/2020   Td 09/28/1999, 06/04/2009   Tdap 04/25/2018    ROS: Denies dizziness, LOC, polyuria, polydipsia, unintended weight loss/gain, foot ulcerations,  shortness of breath or chest pain.  2.  Otalgia Patient reports sinus issues with sneezing, otalgia bilaterally and a mild cough.  She was sick a few months ago but symptoms really never totally resolved.  She has suffered from seasonal allergic rhinitis in the past but it seems to be ongoing this time despite use of Claritin-D.  She is an active smoker.  No hemoptysis reported.   Substance use: Tobacco Diet: typical american, Exercise:  no structured Last eye exam: needs with Dr Marin Comment Last mammogram: UTD Last pap smear: UTD Refills needed today: lantus. Immunizations needed: Immunization History   Administered Date(s) Administered   Fluad Quad(high Dose 65+) 06/30/2016   Hepatitis B, adult 10/27/2006, 11/24/2006   Influenza Inj Mdck Quad Pf 06/30/2016   Influenza Inj Mdck Quad With Preservative 06/30/2016   Influenza,inj,Quad PF,6+ Mos 06/30/2016, 08/22/2017   Influenza-Unspecified 08/22/2017   MMR 09/28/1999, 06/04/2009   Pneumococcal Polysaccharide-23 04/24/2020   Td 09/28/1999, 06/04/2009   Tdap 04/25/2018     Past Medical History:  Diagnosis Date   Anemia    Anxiety    Constipation    Diabetes mellitus without complication (HCC)    Family history of malignant neoplasm of gastrointestinal tract    Gastroparesis    IBS (irritable bowel syndrome)    constipation   Migraines    Neuropathy due to type 2 diabetes mellitus ([place])    Social History   Socioeconomic History   Marital status: Divorced    Spouse name: Not on file   Number of children: 2   Years of education: Not on file   Highest education level: Some college, no degree  Occupational History   Occupation: Actuary    Comment: part time   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  Tobacco Use   Smoking status: Former    Years: 10.00    Types: Cigarettes    Quit date: 06/24/2014    Years since quitting: 8.2   Smokeless tobacco: Never  Vaping Use   Vaping Use: Never used  Substance and Sexual Activity   Alcohol use: Yes    Comment: once in a while   Drug use: No   Sexual  activity: Not Currently    Birth control/protection: Surgical    Comment: hyst  Other Topics Concern   Not on file  Social History Narrative   Not on file   Social Determinants of Health   Financial Resource Strain: Not on file  Food Insecurity: Not on file  Transportation Needs: Not on file  Physical Activity: Not on file  Stress: Not on file  Social Connections: Not on file  Intimate Partner Violence: Not on file   Past Surgical History:  Procedure Laterality Date   ABDOMINAL  HYSTERECTOMY N/A 06/29/2016   Procedure: TOTAL ABDOMINAL HYSTERECTOMY;  Surgeon: Florian Buff, MD;  Location: [facility];  Service: Gynecology;  Laterality: N/A;   CESAREAN SECTION     CHOLECYSTECTOMY  2007   EYE SURGERY  1989   SALPINGOOPHORECTOMY Bilateral 06/29/2016   Procedure: BILATERAL SALPINGO OOPHORECTOMY;  Surgeon: Florian Buff, MD;  Location: [facility];  Service: Gynecology;  Laterality: Bilateral;   TONSILLECTOMY  1992   Family History  Problem Relation Age of Onset   Other Paternal Grandfather        fell down stairs   Other Paternal Grandmother        fell down stairs   Alzheimer's disease Maternal Grandmother    Dementia Maternal Grandmother    Colon cancer Maternal Grandfather    Other Mother        back pain; nerve damage   Hypertension Mother    Hemophilia Son     Current Outpatient Medications:    Accu-Chek FastClix Lancets MISC, Use to monitor blood glucose 3 time(s) daily, Disp: , Rfl:    ALPRAZolam (XANAX) 0.5 MG tablet, TAKE ONE TABLET DAILY AS NEEDED, Disp: 60 tablet, Rfl: 5   ALPRAZolam (XANAX) 1 MG tablet, TAKE 1 TABLET DAILY AS NEEDED, Disp: 30 tablet, Rfl: 5   atorvastatin (LIPITOR) 10 MG tablet, TAKE 1 TABLET DAILY, Disp: 90 tablet, Rfl: 0   Blood Glucose Monitoring Suppl (ACCU-CHEK GUIDE) w/Device KIT, 1 each by Does not applyoroute once for 1 dose. Use as directed by physician to monitor blood sugars, Disp: , Rfl:    Continuous Blood Gluc Sensor (FREESTYLE LIBRE 3 SENSOR) MISC, PLACE 1 SENSOR ON SKIN & CHANGE EVERY 14 DAYS FOR CONTINUOUS GLUCOSE MONITORING, Disp: 6 each, Rfl: 1   escitalopram (LEXAPRO) 20 MG tablet, Take 1 tablet (20 mg total) by mouth daily., Disp: 30 tablet, Rfl: 11   gabapentin (NEURONTIN) 300 MG capsule, Take 300 mg by mouth 4 (four) times daily., Disp: , Rfl:    gabapentin (NEURONTIN) 600 MG tablet, Take 600 mg by mouth 3 (three) times daily., Disp: , Rfl:    Insulin Pen Needle (BD PEN NEEDLE NANO U/F) 32G X 4 MM MISC, Use with insulin   E11.65, Disp: 100 each, Rfl: 3   LANTUS SOLOSTAR 100 UNIT/ML Solostar Pen, INJECT 25 UNITS INTO SKIN AT BEDTIME, Disp: 15 mL, Rfl: 0   magnesium oxide (MAG-OX) 400 MG tablet, Take 400 mg by mouth daily., Disp: , Rfl:    nortriptyline (PAMELOR) 75 MG capsule, Take 75 mg by mouth daily., Disp: , Rfl:    pantoprazole (PROTONIX) 40 MG tablet, Take 1 tablet (40 mg total) by mouth daily., Disp: 30 tablet, Rfl: 11   PAZEO 0.7 % SOLN, Apply 1 drop to eye every morning., Disp: , Rfl:    RESTASIS 0.05 % ophthalmic emulsion, 1 drop 2 (two) times daily., Disp: , Rfl:    silver sulfADIAZINE (SILVADENE) 1 % cream, Use  to area 3 times daily, Disp: 50 g, Rfl: 11   sitaGLIPtin (JANUVIA) 100 MG tablet, Take 1 tablet (100 mg total) by mouth daily., Disp: 90 tablet, Rfl: 3   triamcinolone cream (KENALOG) 0.1 %, Apply 1 application topically 2 (two) times daily. X7 days if needed for rash, Disp: 30 g, Rfl: 0  Allergies  Allergen Reactions   Celebrex [Celecoxib]    Latex    Sulfonamide Derivatives    Metformin And Related Nausea Only   Ozempic (0.25 Or 0.5 Mg-Dose) [Semaglutide(0.25 Or 0.53m-Dos)] Nausea And Vomiting   Victoza [Liraglutide] Nausea And Vomiting     ROS: Review of Systems A comprehensive review of systems was negative except for: Eyes: positive for contacts/glasses Ears, nose, mouth, throat, and face: positive for earaches Respiratory: positive for cough Integument/breast: positive for nail changes    Physical exam BP 128/79   Pulse 60   Temp 98.4 F (36.9 C)   Ht _0  (1.575 m)   Wt 118 lb 6.4 oz (53.7 kg)   LMP 06/12/2016   SpO2 100%   BMI 21.66 kg/m  General appearance: alert, cooperative, appears stated age, and no distress Head: Normocephalic, without obvious abnormality, atraumatic Eyes: negative findings: lids and lashes normal, conjunctivae and sclerae normal, corneas clear, and pupils equal, round, reactive to light and accomodation Ears: normal TM's and external ear  canals both ears Nose: Nares normal. Septum midline. Mucosa normal. No drainage or sinus tenderness. Throat:  poor dentition. MMM.  No oropharyngeal masses or erythema Neck: no adenopathy, supple, symmetrical, trachea midline, and thyroid not enlarged, symmetric, no tenderness/mass/nodules Back: symmetric, no curvature. ROM normal. No CVA tenderness. Lungs: clear to auscultation bilaterally Heart: regular rate and rhythm, S1, S2 normal, no murmur, click, rub or gallop Abdomen: soft, non-tender; bowel sounds normal; no masses,  no organomegaly Extremities: extremities normal, atraumatic, no cyanosis or edema Pulses: 2+ and symmetric Skin: Skin color, texture, turgor normal. No rashes or lesions or see DM foot  Lymph nodes: Cervical, supraclavicular, and axillary nodes normal. Neurologic: Grossly normal Psych: Mood stable, speech normal, affect appropriate  Diabetic Foot Exam - Simple   Simple Foot Form Diabetic Foot exam was performed with the following findings: Yes 09/13/2022  1:15 PM  Visual Inspection See comments: Yes Sensation Testing Intact to touch and monofilament testing bilaterally: Yes Pulse Check Posterior Tibialis and Dorsalis pulse intact bilaterally: Yes Comments Onychomycotic changes to the great toenails bilaterally as well as the second digits bilaterally.  Mild maceration noted in the interdigital spaces as well    Assessment/ Plan: VLannette Donathhere for annual physical exam.   Annual physical exam  Type 2 diabetes mellitus with diabetic polyneuropathy, with long-term current use of insulin ([place] - Plan: Bayer DCA Hb A1c Waived, insulin glargine (LANTUS SOLOSTAR) 100 UNIT/ML Solostar Pen  Onychomycosis - Plan: CMP14+EGFR, terbinafine (LAMISIL) 250 MG tablet  Hyperlipidemia associated with type 2 diabetes mellitus (HCC)  Non-seasonal allergic rhinitis, unspecified trigger - Plan: montelukast (SINGULAIR) 10 MG tablet  Tobacco use  Sugar now controlled  with A1c down to 6.3.  Diabetic foot exam performed.  She will schedule diabetic eye exam.  Continue statin.  Due for lipid but nonfasting today  Will start Lamisil for onychomycosis.  Discussed sterilization of the shoes and carpets at home.  She will need to come in 6 weeks to have liver function test performed.  Singulair added nightly.  Limit use of pseudoephedrine if able as it increases her blood pressure, cardiovascular risk.  Repeat blood pressure within normal range  Counseled on tobacco cessation.  Contemplative  Counseled on healthy lifestyle choices, including diet (rich in fruits, vegetables and lean meats and low in salt and simple carbohydrates) and exercise (at least 30 minutes of moderate physical activity daily).  Patient to follow up in 33mfor DM with me.  Elfrieda Espino M. GLajuana Ripple DO

## 2022-09-16 DIAGNOSIS — R509 Fever, unspecified: Secondary | ICD-10-CM | POA: Diagnosis not present

## 2022-09-16 DIAGNOSIS — R110 Nausea: Secondary | ICD-10-CM | POA: Diagnosis not present

## 2022-09-16 DIAGNOSIS — J101 Influenza due to other identified influenza virus with other respiratory manifestations: Secondary | ICD-10-CM | POA: Diagnosis not present

## 2022-09-16 DIAGNOSIS — B349 Viral infection, unspecified: Secondary | ICD-10-CM | POA: Diagnosis not present

## 2022-09-16 DIAGNOSIS — R051 Acute cough: Secondary | ICD-10-CM | POA: Diagnosis not present

## 2022-09-27 ENCOUNTER — Other Ambulatory Visit: Payer: Self-pay | Admitting: Family Medicine

## 2022-09-27 DIAGNOSIS — E1165 Type 2 diabetes mellitus with hyperglycemia: Secondary | ICD-10-CM

## 2022-09-28 ENCOUNTER — Telehealth: Payer: Self-pay

## 2022-09-28 ENCOUNTER — Encounter: Payer: Self-pay | Admitting: Family Medicine

## 2022-09-28 NOTE — Telephone Encounter (Signed)
Unable to reach patient to schedule follow up mammogram imaging. Multiple calls, mychart messages, letters. Certified letter sent today to the patient.

## 2022-10-25 ENCOUNTER — Other Ambulatory Visit: Payer: Medicaid Other

## 2022-10-25 DIAGNOSIS — B351 Tinea unguium: Secondary | ICD-10-CM | POA: Diagnosis not present

## 2022-10-25 LAB — CMP14+EGFR
ALT: 12 IU/L (ref 0–32)
AST: 17 IU/L (ref 0–40)
Albumin/Globulin Ratio: 2.1 (ref 1.2–2.2)
Albumin: 4.4 g/dL (ref 3.9–4.9)
Alkaline Phosphatase: 101 IU/L (ref 44–121)
BUN/Creatinine Ratio: 17 (ref 9–23)
BUN: 13 mg/dL (ref 6–24)
Bilirubin Total: 0.6 mg/dL (ref 0.0–1.2)
CO2: 25 mmol/L (ref 20–29)
Calcium: 9.6 mg/dL (ref 8.7–10.2)
Chloride: 104 mmol/L (ref 96–106)
Creatinine, Ser: 0.78 mg/dL (ref 0.57–1.00)
Globulin, Total: 2.1 g/dL (ref 1.5–4.5)
Glucose: 115 mg/dL — ABNORMAL HIGH (ref 70–99)
Potassium: 4.1 mmol/L (ref 3.5–5.2)
Sodium: 141 mmol/L (ref 134–144)
Total Protein: 6.5 g/dL (ref 6.0–8.5)
eGFR: 97 mL/min/{1.73_m2} (ref >59)

## 2022-11-22 ENCOUNTER — Other Ambulatory Visit: Payer: Self-pay | Admitting: Family Medicine

## 2022-11-22 DIAGNOSIS — E785 Hyperlipidemia, unspecified: Secondary | ICD-10-CM

## 2022-11-30 ENCOUNTER — Telehealth: Payer: Self-pay

## 2022-11-30 NOTE — Telephone Encounter (Signed)
[name] (Key: [id number]) PA Case ID #: [id number] Rx #: [id number] Need Help? Call us at [phone] Status sent iconSent to Plan today Drug FreeStyle Libre 3 Sensor ePA cloud logo Form [facility] [place] Electronic [place] Form [phone] NCPDP) Original Claim Info 75 CALL [phone] ORSUBMIT PA TO [email or web address] REQUIRES PRIOR AUTHORIZATION

## 2022-12-06 NOTE — Telephone Encounter (Signed)
Patient Advocate Encounter  Prior Authorization for [employer] 3 Sensor has been approved.

## 2023-01-16 ENCOUNTER — Ambulatory Visit: Payer: Medicaid Other | Admitting: Family Medicine

## 2023-02-01 ENCOUNTER — Other Ambulatory Visit: Payer: Self-pay | Admitting: Obstetrics & Gynecology

## 2023-02-20 ENCOUNTER — Encounter: Payer: Self-pay | Admitting: Family Medicine

## 2023-02-28 ENCOUNTER — Other Ambulatory Visit: Payer: Self-pay | Admitting: Family Medicine

## 2023-02-28 DIAGNOSIS — E1165 Type 2 diabetes mellitus with hyperglycemia: Secondary | ICD-10-CM

## 2023-02-28 DIAGNOSIS — E785 Hyperlipidemia, unspecified: Secondary | ICD-10-CM

## 2023-03-02 DIAGNOSIS — M25551 Pain in right hip: Secondary | ICD-10-CM | POA: Diagnosis not present

## 2023-03-02 DIAGNOSIS — M25552 Pain in left hip: Secondary | ICD-10-CM | POA: Diagnosis not present

## 2023-03-02 DIAGNOSIS — M545 Low back pain, unspecified: Secondary | ICD-10-CM | POA: Diagnosis not present

## 2023-03-02 DIAGNOSIS — G8929 Other chronic pain: Secondary | ICD-10-CM | POA: Diagnosis not present

## 2023-03-07 ENCOUNTER — Encounter: Payer: Self-pay | Admitting: Family Medicine

## 2023-03-07 ENCOUNTER — Other Ambulatory Visit: Payer: Self-pay

## 2023-03-07 ENCOUNTER — Ambulatory Visit (INDEPENDENT_AMBULATORY_CARE_PROVIDER_SITE_OTHER): Payer: Medicaid Other | Admitting: Family Medicine

## 2023-03-07 VITALS — BP 115/77 | HR 64 | Temp 98.9°F | Ht 62.0 in | Wt 115.0 lb

## 2023-03-07 DIAGNOSIS — E1169 Type 2 diabetes mellitus with other specified complication: Secondary | ICD-10-CM | POA: Diagnosis not present

## 2023-03-07 DIAGNOSIS — Z794 Long term (current) use of insulin: Secondary | ICD-10-CM

## 2023-03-07 DIAGNOSIS — E1142 Type 2 diabetes mellitus with diabetic polyneuropathy: Secondary | ICD-10-CM | POA: Diagnosis not present

## 2023-03-07 DIAGNOSIS — N6310 Unspecified lump in the right breast, unspecified quadrant: Secondary | ICD-10-CM

## 2023-03-07 DIAGNOSIS — E785 Hyperlipidemia, unspecified: Secondary | ICD-10-CM | POA: Diagnosis not present

## 2023-03-07 DIAGNOSIS — J3089 Other allergic rhinitis: Secondary | ICD-10-CM | POA: Diagnosis not present

## 2023-03-07 LAB — BAYER DCA HB A1C WAIVED: HB A1C (BAYER DCA - WAIVED): 5.7 % — ABNORMAL HIGH (ref 4.8–5.6)

## 2023-03-07 MED ORDER — SITAGLIPTIN PHOSPHATE 100 MG PO TABS: 100.0000 mg | ORAL_TABLET | Freq: Every day | ORAL | 3 refills | Status: DC

## 2023-03-07 MED ORDER — LANTUS SOLOSTAR 100 UNIT/ML ~~LOC~~ SOPN
20.0000 [IU] | PEN_INJECTOR | Freq: Every day | SUBCUTANEOUS | 4 refills | Status: DC
Start: 2023-03-07 — End: 2023-10-06

## 2023-03-07 MED ORDER — BD PEN NEEDLE NANO U/F 32G X 4 MM MISC: 3 refills | Status: DC

## 2023-03-07 MED ORDER — MONTELUKAST SODIUM 10 MG PO TABS
10.0000 mg | ORAL_TABLET | Freq: Every day | ORAL | 3 refills | Status: DC
Start: 2023-03-07 — End: 2023-10-06

## 2023-03-07 MED ORDER — ATORVASTATIN CALCIUM 10 MG PO TABS: 10.0000 mg | ORAL_TABLET | Freq: Every day | ORAL | 3 refills | Status: DC

## 2023-03-07 NOTE — Progress Notes (Signed)
Subjective: CC:Dm PCP: Raliegh Ip, DO [name] is a 42 y.o. female presenting to clinic today for:  1. Type 2 Diabetes with hyperlipidemia:  Glucometer: Freestyle libre 3.   She reports that she has had occasional lows at nighttime where her blood sugar will drop below 60 but she actually starts becoming symptomatic even in the low 80s.  She reduced her Lantus back down to 20-22 units and actually has been dosing this roughly every other day and this has reduced her frequencies of hypoglycemic episodes tremendously.  She is tolerating Januvia without difficulty and really likes this new medicine.  She is compliant with her statin and continues to follow-up with her specialist with regards to her neuropathy.  Last eye exam: needs Last foot exam: UTD Last A1c:  Lab Results  Component Value Date   HGBA1C 6.0 (H) 09/13/2022   Nephropathy screen indicated?: UTD Last flu, zoster and/or pneumovax:  Immunization History  Administered Date(s) Administered   Fluad Quad(high Dose 65+) 06/30/2016   Hepatitis B, ADULT 10/27/2006, 11/24/2006   Influenza Inj Mdck Quad Pf 06/30/2016   Influenza Inj Mdck Quad With Preservative 06/30/2016   Influenza,inj,Quad PF,6+ Mos 06/30/2016, 08/22/2017   Influenza-Unspecified 08/22/2017   MMR 09/28/1999, 06/04/2009   Pneumococcal Polysaccharide-23 04/24/2020   Td 09/28/1999, 06/04/2009   Tdap 04/25/2018    ROS: Per HPI  Allergies  Allergen Reactions   Celebrex [Celecoxib]    Latex    Sulfonamide Derivatives    Metformin And Related Nausea Only   Ozempic (0.25 Or 0.5 Mg-Dose) [Semaglutide(0.25 Or 0.5mg -Dos)] Nausea And Vomiting   Victoza [Liraglutide] Nausea And Vomiting   Past Medical History:  Diagnosis Date   Anemia    Anxiety    Constipation    Diabetes mellitus without complication (HCC)    Family history of malignant neoplasm of gastrointestinal tract    Gastroparesis    IBS (irritable bowel syndrome)     constipation   Migraines    Neuropathy due to type 2 diabetes mellitus (HCC)     Current Outpatient Medications:    Accu-Chek FastClix Lancets MISC, Use to monitor blood glucose 3 time(s) daily, Disp: , Rfl:    ALPRAZolam (XANAX) 0.5 MG tablet, TAKE ONE TABLET DAILY AS NEEDED, Disp: 60 tablet, Rfl: 5   atorvastatin (LIPITOR) 10 MG tablet, TAKE ONE TABLET DAILY, Disp: 90 tablet, Rfl: 0   Blood Glucose Monitoring Suppl (ACCU-CHEK GUIDE) w/Device KIT, 1 each by Does not applyoroute once for 1 dose. Use as directed by physician to monitor blood sugars, Disp: , Rfl:    Continuous Glucose Sensor (FREESTYLE LIBRE 3 SENSOR) MISC, PLACE 1 SENSOR ON SKIN & CHANGE EVERY 14 DAYS FOR CONTINUOUS GLUCOSE MONITORING, Disp: 6 each, Rfl: 3   escitalopram (LEXAPRO) 20 MG tablet, Take 1 tablet (20 mg total) by mouth daily., Disp: 30 tablet, Rfl: 11   gabapentin (NEURONTIN) 300 MG capsule, Take 300 mg by mouth 4 (four) times daily., Disp: , Rfl:    gabapentin (NEURONTIN) 600 MG tablet, Take 600 mg by mouth 3 (three) times daily., Disp: , Rfl:    insulin glargine (LANTUS SOLOSTAR) 100 UNIT/ML Solostar Pen, INJECT 25 UNITS INTO SKIN AT BEDTIME, Disp: 15 mL, Rfl: 3   Insulin Pen Needle (BD PEN NEEDLE NANO U/F) 32G X 4 MM MISC, Use with insulin  E11.65, Disp: 100 each, Rfl: 3   magnesium oxide (MAG-OX) 400 MG tablet, Take 400 mg by mouth daily., Disp: , Rfl:    montelukast (  SINGULAIR) 10 MG tablet, Take 1 tablet (10 mg total) by mouth at bedtime. For allergies, Disp: 90 tablet, Rfl: 3   nortriptyline (PAMELOR) 75 MG capsule, Take 75 mg by mouth daily., Disp: , Rfl:    pantoprazole (PROTONIX) 40 MG tablet, Take 1 tablet (40 mg total) by mouth daily., Disp: 30 tablet, Rfl: 11   PAZEO 0.7 % SOLN, Apply 1 drop to eye every morning., Disp: , Rfl:    RESTASIS 0.05 % ophthalmic emulsion, 1 drop 2 (two) times daily., Disp: , Rfl:    silver sulfADIAZINE (SILVADENE) 1 % cream, Use to area 3 times daily, Disp: 50 g, Rfl: 11    sitaGLIPtin (JANUVIA) 100 MG tablet, Take 1 tablet (100 mg total) by mouth daily., Disp: 90 tablet, Rfl: 3   triamcinolone cream (KENALOG) 0.1 %, Apply 1 application topically 2 (two) times daily. 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  Tobacco Use   Smoking status: Former    Years: 10    Types: Cigarettes    Quit date: 06/24/2014    Years since quitting: 8.7   Smokeless tobacco: Never  Vaping Use   Vaping Use: Never used  Substance and Sexual Activity   Alcohol use: Yes    Comment: once in a while   Drug use: No   Sexual activity: Not Currently    Birth control/protection: Surgical    Comment: hyst  Other Topics Concern   Not on file  Social History Narrative   Not on file   Social Determinants of Health   Financial Resource Strain: Not on file  Food Insecurity: Not on file  Transportation Needs: Not on file  Physical Activity: Not on file  Stress: Not on file  Social Connections: Not on file  Intimate Partner Violence: Not on file   Family History  Problem Relation Age of Onset   Other Paternal Grandfather        fell down stairs   Other Paternal Grandmother        fell down stairs   Alzheimer's disease Maternal Grandmother    Dementia Maternal Grandmother    Colon cancer Maternal Grandfather    Other Mother        back pain; nerve damage   Hypertension Mother    Hemophilia Son     Objective: Office vital signs reviewed. BP 115/77   Pulse 64   Temp 98.9 F (37.2 C)   Ht 5\' 2"  (1.575 m)   Wt 115 lb (52.2 kg)   LMP 06/12/2016   SpO2 96%   BMI 21.03 kg/m   Physical Examination:  General: Awake,  alert, well nourished, No acute distress HEENT: sclera white, MMM. No rhinorrhea appreciated. Cardio: regular rate and rhythm, normal pulses Pulm: normal work of breathing on room air. No wheezing or dyspnea on exertion observed.     03/07/2023   11:30 AM 09/13/2022   12:47 PM 05/24/2022    1:12 PM  Depression screen PHQ 2/9  Decreased Interest 1 1 1   Down, Depressed, Hopeless 0 1 1  PHQ - 2  Score 1 2 2   Altered sleeping 2 2 2   Tired, decreased energy 1 1 2   Change in appetite 1 0 2  Feeling bad or failure about yourself  0 0 0  Trouble concentrating 0 1 0  Moving slowly or fidgety/restless 0 0 0  Suicidal thoughts 0 0 0  PHQ-9 Score 5 6 8   Difficult doing work/chores  Somewhat difficult Not difficult at all     Assessment/ Plan: 42 y.o. female   Type 2 diabetes mellitus with diabetic polyneuropathy, with long-term current use of insulin (HCC) - Plan: Bayer DCA Hb A1c Waived, CMP14+EGFR, insulin glargine (LANTUS SOLOSTAR) 100 UNIT/ML Solostar Pen, Insulin Pen Needle (BD PEN NEEDLE NANO U/F) 32G X 4 MM MISC, sitaGLIPtin (JANUVIA) 100 MG tablet  Hyperlipidemia associated with type 2 diabetes mellitus (HCC) - Plan: CMP14+EGFR, TSH  Non-seasonal allergic rhinitis, unspecified trigger - Plan: montelukast (SINGULAIR) 10 MG tablet  Sugar under excellent control with A1c down to 5.7.  I think it is totally reasonable to back down on that insulin if she is finding it to be causing hypoglycemic episodes.  I would continue to reduce until she is not having hypoglycemia anymore.  Continue Januvia.  I renewed her medications.  Will get her set up for diabetic eye exam here in office  Needs fasting lipid but not currently fasting.  Advised to schedule full physical exam with fasting labs in 6 months.  Check renal function, liver enzymes, TSH.  Continue statin  Singulair renewed for allergies.  No orders of the defined types were placed in this encounter.  No orders of the defined types were  placed in this encounter.    Raliegh Ip, DO [facility] [phone]

## 2023-03-08 LAB — CMP14+EGFR
ALT: 15 IU/L (ref 0–32)
AST: 18 IU/L (ref 0–40)
Albumin: 4.5 g/dL (ref 3.9–4.9)
Alkaline Phosphatase: 112 IU/L (ref 44–121)
BUN/Creatinine Ratio: 11 (ref 9–23)
BUN: 9 mg/dL (ref 6–24)
Bilirubin Total: 0.6 mg/dL (ref 0.0–1.2)
CO2: 22 mmol/L (ref 20–29)
Calcium: 9.9 mg/dL (ref 8.7–10.2)
Chloride: 104 mmol/L (ref 96–106)
Creatinine, Ser: 0.79 mg/dL (ref 0.57–1.00)
Globulin, Total: 2.4 g/dL (ref 1.5–4.5)
Glucose: 192 mg/dL — ABNORMAL HIGH (ref 70–99)
Potassium: 4.2 mmol/L (ref 3.5–5.2)
Sodium: 143 mmol/L (ref 134–144)
Total Protein: 6.9 g/dL (ref 6.0–8.5)
eGFR: 95 mL/min/{1.73_m2} (ref >59)

## 2023-03-08 LAB — TSH: TSH: 1.24 u[IU]/mL (ref 0.450–4.500)

## 2023-03-09 ENCOUNTER — Ambulatory Visit: Payer: Medicaid Other

## 2023-04-05 ENCOUNTER — Ambulatory Visit: Payer: Medicaid Other

## 2023-04-05 ENCOUNTER — Ambulatory Visit: Admission: RE | Admit: 2023-04-05 | Payer: Medicaid Other | Source: Ambulatory Visit

## 2023-04-05 ENCOUNTER — Ambulatory Visit
Admission: RE | Admit: 2023-04-05 | Discharge: 2023-04-05 | Disposition: A | Discharge status: Home / Self Care | Payer: Medicaid Other | Source: Ambulatory Visit | Attending: Family Medicine | Admitting: Family Medicine

## 2023-04-05 DIAGNOSIS — N6310 Unspecified lump in the right breast, unspecified quadrant: Secondary | ICD-10-CM

## 2023-04-05 DIAGNOSIS — Z86018 Personal history of other benign neoplasm: Secondary | ICD-10-CM | POA: Diagnosis not present

## 2023-04-27 ENCOUNTER — Telehealth: Payer: Self-pay | Admitting: Family Medicine

## 2023-04-27 NOTE — Telephone Encounter (Signed)
Left VM. Sensor at front for pt.

## 2023-05-04 ENCOUNTER — Other Ambulatory Visit: Payer: Self-pay | Admitting: Family Medicine

## 2023-05-05 NOTE — Telephone Encounter (Signed)
This med is managed by her OB Dr Despina Hidden

## 2023-06-01 ENCOUNTER — Other Ambulatory Visit: Payer: Self-pay | Admitting: Obstetrics & Gynecology

## 2023-06-15 ENCOUNTER — Other Ambulatory Visit: Payer: Self-pay | Admitting: Family Medicine

## 2023-06-15 DIAGNOSIS — E1165 Type 2 diabetes mellitus with hyperglycemia: Secondary | ICD-10-CM

## 2023-06-16 NOTE — Telephone Encounter (Signed)
Name from pharmacy: FreeStyle Libre 3 Sensor device  To pharmacy: BECAUSE OF SHORTAGES, WOULD YOU CONSIDER SENDING AN RX FOR THE 3 "PLUS" TO SEE IF INS WILL COVER?

## 2023-06-16 NOTE — Telephone Encounter (Signed)
New rx sent  Meds ordered this encounter  Medications   Continuous Glucose Sensor (FREESTYLE LIBRE 3 PLUS SENSOR) MISC    Sig: Use to continuously monitor glucose.  Change every 15 days. E11.65    Dispense:  3 each    Refill:  PRN

## 2023-06-28 ENCOUNTER — Telehealth: Payer: Self-pay

## 2023-06-28 DIAGNOSIS — E119 Type 2 diabetes mellitus without complications: Secondary | ICD-10-CM

## 2023-06-28 NOTE — Telephone Encounter (Signed)
CAMYLA CAMPOSANO (KeyChinita [place]) PA Case ID #: [id number] Rx #: [id number] Need Help? Call us at [phone] Status sent iconSent to Plan today Drug FreeStyle Libre 3 Plus Sensor ePA cloud logo Form [facility] [place] Electronic [place] Form [phone] NCPDP) Original Claim Info 70 BILL PREFERRED ALTERNATIVE OR SUBMIT [place] [facility] FAX [phone] EXCLUSION

## 2023-06-29 NOTE — Telephone Encounter (Signed)
Can you help? She is insulin dependent

## 2023-06-29 NOTE — Telephone Encounter (Signed)
Pharmacy Patient Advocate Encounter  Received notification from [facility] that Prior Authorization for FreeStyle Libre 3 Plus Sensor has been DENIED.  Full denial letter will be uploaded to the media tab. See denial reason below.   PA #/Case ID/Reference #: [id number]   DENIAL REASON: This is a courtesy notification to advise you that we do not cover the above-requested medication under this member's benefit plan.

## 2023-06-30 NOTE — Telephone Encounter (Signed)
Please ask pt to call around for [facility] 3. Her insurance won't cover the plus

## 2023-07-04 NOTE — Telephone Encounter (Signed)
No answer no vm

## 2023-07-13 ENCOUNTER — Other Ambulatory Visit: Payer: Self-pay | Admitting: Obstetrics & Gynecology

## 2023-07-18 ENCOUNTER — Telehealth: Payer: Self-pay | Admitting: Pharmacist

## 2023-07-18 MED ORDER — FREESTYLE LIBRE 3 SENSOR MISC
5 refills | Status: DC
Start: 2023-07-18 — End: 2023-10-06

## 2023-07-18 NOTE — Addendum Note (Signed)
Addended by: Vanice Sarah D on: 07/18/2023 11:02 AM   Modules accepted: Orders

## 2023-07-18 NOTE — Telephone Encounter (Signed)
Sent in [place]  Pharmacy may have in stock now [place] 3 PLUS not covered under current plan

## 2023-08-08 ENCOUNTER — Other Ambulatory Visit: Payer: Self-pay | Admitting: Obstetrics & Gynecology

## 2023-08-29 ENCOUNTER — Telehealth: Payer: Self-pay | Admitting: Family Medicine

## 2023-10-06 ENCOUNTER — Ambulatory Visit (INDEPENDENT_AMBULATORY_CARE_PROVIDER_SITE_OTHER): Payer: Medicaid Other | Admitting: Family Medicine

## 2023-10-06 ENCOUNTER — Encounter: Payer: Self-pay | Admitting: Family Medicine

## 2023-10-06 VITALS — BP 99/64 | HR 77 | Temp 98.5°F | Ht 62.0 in | Wt 117.0 lb

## 2023-10-06 DIAGNOSIS — E8940 Asymptomatic postprocedural ovarian failure: Secondary | ICD-10-CM | POA: Diagnosis not present

## 2023-10-06 DIAGNOSIS — Z794 Long term (current) use of insulin: Secondary | ICD-10-CM

## 2023-10-06 DIAGNOSIS — E119 Type 2 diabetes mellitus without complications: Secondary | ICD-10-CM | POA: Diagnosis not present

## 2023-10-06 DIAGNOSIS — Z0001 Encounter for general adult medical examination with abnormal findings: Secondary | ICD-10-CM

## 2023-10-06 DIAGNOSIS — E785 Hyperlipidemia, unspecified: Secondary | ICD-10-CM | POA: Diagnosis not present

## 2023-10-06 DIAGNOSIS — E1169 Type 2 diabetes mellitus with other specified complication: Secondary | ICD-10-CM

## 2023-10-06 DIAGNOSIS — Z0000 Encounter for general adult medical examination without abnormal findings: Secondary | ICD-10-CM

## 2023-10-06 DIAGNOSIS — J3089 Other allergic rhinitis: Secondary | ICD-10-CM

## 2023-10-06 LAB — CBC
Hematocrit: 42.1 % (ref 34.0–46.6)
Hemoglobin: 14.1 g/dL (ref 11.1–15.9)
MCH: 30.3 pg (ref 26.6–33.0)
MCHC: 33.5 g/dL (ref 31.5–35.7)
MCV: 91 fL (ref 79–97)
Platelets: 349 10*3/uL (ref 150–450)
RBC: 4.65 x10E6/uL (ref 3.77–5.28)
RDW: 13.1 % (ref 11.7–15.4)
WBC: 12.4 10*3/uL — ABNORMAL HIGH (ref 3.4–10.8)

## 2023-10-06 LAB — BAYER DCA HB A1C WAIVED: HB A1C (BAYER DCA - WAIVED): 6 % — ABNORMAL HIGH (ref 4.8–5.6)

## 2023-10-06 LAB — LIPID PANEL
Chol/HDL Ratio: 2.5 {ratio} (ref 0.0–4.4)
Cholesterol, Total: 158 mg/dL (ref 100–199)
HDL: 63 mg/dL (ref >39)
LDL Chol Calc (NIH): 78 mg/dL (ref 0–99)
Triglycerides: 91 mg/dL (ref 0–149)
VLDL Cholesterol Cal: 17 mg/dL (ref 5–40)

## 2023-10-06 LAB — CMP14+EGFR
ALT: 31 [IU]/L (ref 0–32)
AST: 19 [IU]/L (ref 0–40)
Albumin: 4.3 g/dL (ref 3.9–4.9)
Alkaline Phosphatase: 123 [IU]/L — ABNORMAL HIGH (ref 44–121)
BUN/Creatinine Ratio: 13 (ref 9–23)
BUN: 11 mg/dL (ref 6–24)
Bilirubin Total: 0.9 mg/dL (ref 0.0–1.2)
CO2: 23 mmol/L (ref 20–29)
Calcium: 9.8 mg/dL (ref 8.7–10.2)
Chloride: 104 mmol/L (ref 96–106)
Creatinine, Ser: 0.82 mg/dL (ref 0.57–1.00)
Globulin, Total: 2.5 g/dL (ref 1.5–4.5)
Glucose: 107 mg/dL — ABNORMAL HIGH (ref 70–99)
Potassium: 4.1 mmol/L (ref 3.5–5.2)
Sodium: 143 mmol/L (ref 134–144)
Total Protein: 6.8 g/dL (ref 6.0–8.5)
eGFR: 91 mL/min/{1.73_m2} (ref >59)

## 2023-10-06 MED ORDER — ATORVASTATIN CALCIUM 10 MG PO TABS: 10.0000 mg | ORAL_TABLET | Freq: Every day | ORAL | 3 refills | Status: AC

## 2023-10-06 MED ORDER — LANTUS SOLOSTAR 100 UNIT/ML ~~LOC~~ SOPN: 20.0000 [IU] | PEN_INJECTOR | Freq: Every day | SUBCUTANEOUS | 4 refills | Status: AC

## 2023-10-06 MED ORDER — MONTELUKAST SODIUM 10 MG PO TABS: 10.0000 mg | ORAL_TABLET | Freq: Every day | ORAL | 3 refills | Status: DC

## 2023-10-06 MED ORDER — FREESTYLE LIBRE 3 PLUS SENSOR MISC: 4 refills | Status: DC

## 2023-10-06 MED ORDER — SITAGLIPTIN PHOSPHATE 100 MG PO TABS: 100.0000 mg | ORAL_TABLET | Freq: Every day | ORAL | 3 refills | Status: AC

## 2023-10-06 MED ORDER — BD PEN NEEDLE NANO U/F 32G X 4 MM MISC: 3 refills | Status: AC

## 2023-10-06 NOTE — Progress Notes (Signed)
[name] is a 43 y.o. female presents to office today for annual physical exam examination.    Concerns today include: 1. Type 2 Diabetes with hyperlipidemia:  She was utilizing freestyle libre 3 but was told that it had been recalled and her pharmacy notes that she could not have the freestyle libre 3+ back in September.  She has subsequently been utilizing fingerstick glucose and admits that she does not use her insulin consistently because she is not continuously monitoring.  However, she has been very consistent with Januvia.  No reported low blood sugars.  She is compliant with Lipitor.  Last eye exam: needs Last foot exam: needs Last A1c:  Lab Results  Component Value Date   HGBA1C 5.7 (H) 03/07/2023   Nephropathy screen indicated?: needs Last flu, zoster and/or pneumovax:  Immunization History  Administered Date(s) Administered   Fluad Quad(high Dose 65+) 06/30/2016   Hepatitis B, ADULT 10/27/2006, 11/24/2006   Influenza Inj Mdck Quad Pf 06/30/2016   Influenza Inj Mdck Quad With Preservative 06/30/2016   Influenza,inj,Quad PF,6+ Mos 06/30/2016, 08/22/2017   Influenza-Unspecified 08/22/2017   MMR 09/28/1999, 06/04/2009   Pneumococcal Polysaccharide-23 04/24/2020   Td 09/28/1999, 06/04/2009   Tdap 04/25/2018    ROS: Reports neuropathy is chronic and stable.  Denies any new foot lesions.  Vision has been stable but she has an appointment with the eye doctor scheduled soon.   Health Maintenance Due  Topic Date Due   Diabetic kidney evaluation - Urine ACR  05/25/2023   FOOT EXAM  09/14/2023   Refills needed today: all  Immunization History  Administered Date(s) Administered   Fluad Quad(high Dose 65+) 06/30/2016   Hepatitis B, ADULT 10/27/2006, 11/24/2006   Influenza Inj Mdck Quad Pf 06/30/2016   Influenza Inj Mdck Quad With Preservative 06/30/2016   Influenza,inj,Quad PF,6+ Mos 06/30/2016, 08/22/2017   Influenza-Unspecified 08/22/2017   MMR 09/28/1999,  06/04/2009   Pneumococcal Polysaccharide-23 04/24/2020   Td 09/28/1999, 06/04/2009   Tdap 04/25/2018   Past Medical History:  Diagnosis Date   Abdominal pain, left lower quadrant 04/09/2010   Qualifier: Diagnosis of   By: Jarold Motto MD Lang Snow        Anemia    Anxiety    Constipation    Diabetes mellitus without complication (HCC)    Family history of malignant neoplasm of gastrointestinal tract    Gastroparesis    Hemorrhage of rectum and anus 03/08/2010   Qualifier: Diagnosis of   By: Surface RN, Lupita Leash         IBS (irritable bowel syndrome)    constipation   Migraines    Neuropathy due to type 2 diabetes mellitus (HCC)    Social History   Socioeconomic History   Marital status: Divorced    Spouse name: Not on file   Number of children: 2   Years of education: Not on file   Highest education level: Some college, no degree  Occupational History   Occupation: Comptroller    Comment: part time   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  Tobacco Use   Smoking status: Former    Current packs/day: 0.00    Types: Cigarettes    Start date: 06/24/2004    Quit date: 06/24/2014    Years since quitting: 9.2   Smokeless tobacco: Never  Vaping Use   Vaping status: Never Used  Substance and Sexual Activity   Alcohol use: Yes    Comment: once in a  while   Drug use: No   Sexual activity: Not Currently    Birth control/protection: Surgical    Comment: hyst  Other Topics Concern   Not on file  Social History Narrative   Not on file   Social Drivers of Health   Financial Resource Strain: Low Risk  (10/06/2023)   Overall Financial Resource Strain (CARDIA)    Difficulty of Paying Living Expenses: Not hard at all  Food Insecurity: No Food Insecurity (10/06/2023)   Hunger Vital Sign    Worried About Running Out of Food in the Last Year: Never true    Ran Out of Food in the Last Year: Never true  Transportation Needs: No Transportation Needs  (10/06/2023)   PRAPARE - Administrator, Civil Service (Medical): No    Lack of Transportation (Non-Medical): No  Physical Activity: Inactive (10/06/2023)   Exercise Vital Sign    Days of Exercise per Week: 0 days    Minutes of Exercise per Session: 0 min  Stress: Stress Concern Present (10/06/2023)   [employer] of Occupational Health - Occupational Stress Questionnaire    Feeling of Stress : To some extent  Social Connections: Unknown (10/06/2023)   Social Connection and Isolation Panel [NHANES]    Frequency of Communication with Friends and Family: Twice a week    Frequency of Social Gatherings with Friends and Family: Twice a week    Attends Religious Services: More than 4 times per year    Active Member of [employer] or Organizations: No    Attends Banker Meetings: Never    Marital Status: Not on file  Intimate Partner Violence: Not At Risk (10/06/2023)   Humiliation, Afraid, Rape, and Kick questionnaire    Fear of Current or Ex-Partner: No    Emotionally Abused: No    Physically Abused: No    Sexually Abused: No   Past Surgical History:  Procedure Laterality Date   ABDOMINAL HYSTERECTOMY N/A 06/29/2016   Procedure: TOTAL ABDOMINAL HYSTERECTOMY;  Surgeon: Lazaro Arms, MD;  Location: [facility];  Service: Gynecology;  Laterality: N/A;   BREAST BIOPSY     CESAREAN SECTION     CHOLECYSTECTOMY  09/19/2005   EYE SURGERY  09/20/1987   SALPINGOOPHORECTOMY Bilateral 06/29/2016   Procedure: BILATERAL SALPINGO OOPHORECTOMY;  Surgeon: Lazaro Arms, MD;  Location: [facility];  Service: Gynecology;  Laterality: Bilateral;   TONSILLECTOMY  09/19/1990   Family History  Problem Relation Age of Onset   Other Paternal Grandfather        fell down stairs   Other Paternal Grandmother        fell down stairs   Alzheimer's disease Maternal Grandmother    Dementia Maternal Grandmother    Colon cancer Maternal Grandfather    Other Mother        back pain; nerve damage    Hypertension Mother    Hemophilia Son     Current Outpatient Medications:    Accu-Chek FastClix Lancets MISC, Use to monitor blood glucose 3 time(s) daily, Disp: , Rfl:    ALPRAZolam (XANAX) 0.5 MG tablet, TAKE ONE TABLET DAILY AS NEEDED, Disp: 60 tablet, Rfl: 5   atorvastatin (LIPITOR) 10 MG tablet, Take 1 tablet (10 mg total) by mouth daily., Disp: 90 tablet, Rfl: 3   Blood Glucose Monitoring Suppl (ACCU-CHEK GUIDE) w/Device KIT, 1 each by Does not applyoroute once for 1 dose. Use as directed by physician to monitor blood sugars, Disp: , Rfl:  Continuous Glucose Sensor (FREESTYLE LIBRE 3 SENSOR) MISC, Use 1 device subcutaneously as directed to monitor glucose continuously every 14 days, Disp: 2 each, Rfl: 5   diclofenac Sodium (VOLTAREN) 1 % GEL, APPLY 2 GRAMS TO AFFECTED JOINT(S) FOUR TIMES DAILY, Disp: , Rfl:    escitalopram (LEXAPRO) 20 MG tablet, TAKE ONE TABLET BY MOUTH DAILY, Disp: 30 tablet, Rfl: 11   gabapentin (NEURONTIN) 300 MG capsule, Take 300 mg by mouth 4 (four) times daily., Disp: , Rfl:    gabapentin (NEURONTIN) 600 MG tablet, Take 600 mg by mouth 3 (three) times daily., Disp: , Rfl:    insulin glargine (LANTUS SOLOSTAR) 100 UNIT/ML Solostar Pen, Inject 20-25 Units into the skin at bedtime. INJECT 25 UNITS INTO SKIN AT BEDTIME, Disp: 15 mL, Rfl: 4   Insulin Pen Needle (BD PEN NEEDLE NANO U/F) 32G X 4 MM MISC, Use with insulin  E11.65, Disp: 100 each, Rfl: 3   magnesium oxide (MAG-OX) 400 MG tablet, Take 400 mg by mouth daily., Disp: , Rfl:    montelukast (SINGULAIR) 10 MG tablet, Take 1 tablet (10 mg total) by mouth at bedtime. For allergies, Disp: 90 tablet, Rfl: 3   nortriptyline (PAMELOR) 75 MG capsule, Take 75 mg by mouth daily., Disp: , Rfl:    pantoprazole (PROTONIX) 40 MG tablet, TAKE ONE TABLET BY MOUTH DAILY, Disp: 30 tablet, Rfl: 11   PAZEO 0.7 % SOLN, Apply 1 drop to eye every morning., Disp: , Rfl:    RESTASIS 0.05 % ophthalmic emulsion, 1 drop 2 (two) times  daily., Disp: , Rfl:    silver sulfADIAZINE (SILVADENE) 1 % cream, Use to area 3 times daily, Disp: 50 g, Rfl: 11   sitaGLIPtin (JANUVIA) 100 MG tablet, Take 1 tablet (100 mg total) by mouth daily., Disp: 90 tablet, Rfl: 3   tiZANidine (ZANAFLEX) 4 MG tablet, Take 4 mg by mouth 2 (two) times daily., Disp: , Rfl:    triamcinolone cream (KENALOG) 0.1 %, Apply 1 application topically 2 (two) times daily. X7 days if needed for rash, Disp: 30 g, Rfl: 0   valACYclovir (VALTREX) 1000 MG tablet, TAKE ONE TABLET ONCE DAILY, Disp: 30 tablet, Rfl: 11  Allergies  Allergen Reactions   Celebrex [Celecoxib]    Latex    Sulfonamide Derivatives    Metformin And Related Nausea Only   Ozempic (0.25 Or 0.5 Mg-Dose) [Semaglutide(0.25 Or 0.5mg -Dos)] Nausea And Vomiting   Victoza [Liraglutide] Nausea And Vomiting     ROS: Review of Systems Pertinent items noted in HPI and remainder of comprehensive ROS otherwise negative.    Physical exam BP 99/64   Pulse 77   Temp 98.5 F (36.9 C)   Ht 5\' 2"  (1.575 m)   Wt 117 lb (53.1 kg)   LMP 06/12/2016   SpO2 97%   BMI 21.40 kg/m  General appearance: alert, cooperative, appears stated age, and no distress Head: Normocephalic, without obvious abnormality, atraumatic Eyes: negative findings: lids and lashes normal, conjunctivae and sclerae normal, corneas clear, and pupils equal, round, reactive to light and accomodation Ears: normal TM's and external ear canals both ears Nose: Nares normal. Septum midline. Mucosa normal. No drainage or sinus tenderness. Throat:  Poor dentition.  Caries present.  Oropharynx without any masses, erythema or exudates Neck: no adenopathy, supple, symmetrical, trachea midline, and thyroid not enlarged, symmetric, no tenderness/mass/nodules Back: symmetric, no curvature. ROM normal. No CVA tenderness. Lungs: clear to auscultation bilaterally Heart: regular rate and rhythm, S1, S2 normal, no murmur, click,  rub or gallop Abdomen: soft,  non-tender; bowel sounds normal; no masses,  no organomegaly Extremities: extremities normal, atraumatic, no cyanosis or edema Pulses: 2+ and symmetric Skin: Skin color, texture, turgor normal. No rashes or lesions Lymph nodes: Cervical, supraclavicular, and axillary nodes normal. Neurologic: Grossly normal   Diabetic Foot Exam - Simple   Simple Foot Form Diabetic Foot exam was performed with the following findings: Yes 10/06/2023  9:57 AM  Visual Inspection No deformities, no ulcerations, no other skin breakdown bilaterally: Yes Sensation Testing Intact to touch and monofilament testing bilaterally: Yes Pulse Check Posterior Tibialis and Dorsalis pulse intact bilaterally: Yes Comments        10/06/2023    8:54 AM 03/07/2023   11:30 AM 09/13/2022   12:47 PM  Depression screen PHQ 2/9  Decreased Interest 0 1 1  Down, Depressed, Hopeless 0 0 1  PHQ - 2 Score 0 1 2  Altered sleeping 1 2 2   Tired, decreased energy 1 1 1   Change in appetite 1 1 0  Feeling bad or failure about yourself  0 0 0  Trouble concentrating 0 0 1  Moving slowly or fidgety/restless 0 0 0  Suicidal thoughts 0 0 0  PHQ-9 Score 3 5 6   Difficult doing work/chores Not difficult at all  Somewhat difficult      10/06/2023    8:55 AM 03/07/2023   11:30 AM 09/13/2022   12:47 PM 05/24/2022    1:13 PM  GAD 7 : Generalized Anxiety Score  Nervous, Anxious, on Edge 1 1 1 1   Control/stop worrying 1 1 1 1   Worry too much - different things 1 1 1 1   Trouble relaxing 0 1 1 1   Restless 1 1 0 0  Easily annoyed or irritable 0 1 1 0  Afraid - awful might happen 0 0 0 0  Total GAD 7 Score 4 6 5 4   Anxiety Difficulty Not difficult at all  Not difficult at all Not difficult at all     Assessment/ Plan: [name] here for annual physical exam.   Annual physical exam  Insulin-requiring or dependent type II diabetes mellitus (HCC) - Plan: CMP14+EGFR, Bayer DCA Hb A1c Waived, Microalbumin / creatinine urine ratio,  CBC, Continuous Glucose Sensor (FREESTYLE LIBRE 3 PLUS SENSOR) MISC, insulin glargine (LANTUS SOLOSTAR) 100 UNIT/ML Solostar Pen, Insulin Pen Needle (BD PEN NEEDLE NANO U/F) 32G X 4 MM MISC, sitaGLIPtin (JANUVIA) 100 MG tablet  Hyperlipidemia associated with type 2 diabetes mellitus (HCC) - Plan: CMP14+EGFR, Lipid Panel, Bayer DCA Hb A1c Waived, atorvastatin (LIPITOR) 10 MG tablet  Surgical menopause, asymptomatic - Plan: DG [facility] DEXA  Non-seasonal allergic rhinitis, unspecified trigger - Plan: montelukast (SINGULAIR) 10 MG tablet  Up-to-date on preventative care.  Her sugar was controlled with A1c of 6.0 today.  No changes needed.  I have ordered freestyle libre 3+ which appears to be covered by insurance.  I have also renewed all diabetic medications and supplies.  Will attempt to get urine microalbumin and hopefully sufficient urine was obtained today.  She has a diabetic eye exam scheduled and will have this sent to me once completed  She will continue statin.  Fasting lipid collected today.  Check liver enzymes  Given that she is 7 years postmenopausal via total hysterectomy I recommended DEXA scan and she will get this set up at discharge today  Allergies are chronic and stable on Singulair was renewed  Counseled on healthy lifestyle choices, including diet (rich in  fruits, vegetables and lean meats and low in salt and simple carbohydrates) and exercise (at least 30 minutes of moderate physical activity daily).  Patient to follow up 101m for DM  Zaim Nitta M. Nadine Counts, DO

## 2023-10-06 NOTE — Patient Instructions (Signed)
Preventive Care 40-44 Years Old, Female Preventive care refers to lifestyle choices and visits with your health care provider that can promote health and wellness. Preventive care visits are also called wellness exams. What can I expect for my preventive care visit? Counseling Your health care provider may ask you questions about your: Medical history, including: Past medical problems. Family medical history. Pregnancy history. Current health, including: Menstrual cycle. Method of birth control. Emotional well-being. Home life and relationship well-being. Sexual activity and sexual health. Lifestyle, including: Alcohol, nicotine or tobacco, and drug use. Access to firearms. Diet, exercise, and sleep habits. Work and work environment. Sunscreen use. Safety issues such as seatbelt and bike helmet use. Physical exam Your health care provider will check your: Height and weight. These may be used to calculate your BMI (body mass index). BMI is a measurement that tells if you are at a healthy weight. Waist circumference. This measures the distance around your waistline. This measurement also tells if you are at a healthy weight and may help predict your risk of certain diseases, such as type 2 diabetes and high blood pressure. Heart rate and blood pressure. Body temperature. Skin for abnormal spots. What immunizations do I need?  Vaccines are usually given at various ages, according to a schedule. Your health care provider will recommend vaccines for you based on your age, medical history, and lifestyle or other factors, such as travel or where you work. What tests do I need? Screening Your health care provider may recommend screening tests for certain conditions. This may include: Lipid and cholesterol levels. Diabetes screening. This is done by checking your blood sugar (glucose) after you have not eaten for a while (fasting). Pelvic exam and Pap test. Hepatitis B test. Hepatitis C  test. HIV (human immunodeficiency virus) test. STI (sexually transmitted infection) testing, if you are at risk. Lung cancer screening. Colorectal cancer screening. Mammogram. Talk with your health care provider about when you should start having regular mammograms. This may depend on whether you have a family history of breast cancer. BRCA-related cancer screening. This may be done if you have a family history of breast, ovarian, tubal, or peritoneal cancers. Bone density scan. This is done to screen for osteoporosis. Talk with your health care provider about your test results, treatment options, and if necessary, the need for more tests. Follow these instructions at home: Eating and drinking  Eat a diet that includes fresh fruits and vegetables, whole grains, lean protein, and low-fat dairy products. Take vitamin and mineral supplements as recommended by your health care provider. Do not drink alcohol if: Your health care provider tells you not to drink. You are pregnant, may be pregnant, or are planning to become pregnant. If you drink alcohol: Limit how much you have to 0-1 drink a day. Know how much alcohol is in your drink. In the [place] one drink equals one 12 oz bottle of beer (355 mL), one 5 oz glass of wine (148 mL), or one 1 oz glass of hard liquor (44 mL). Lifestyle Brush your teeth every morning and night with fluoride toothpaste. Floss one time each day. Exercise for at least 30 minutes 5 or more days each week. Do not use any products that contain nicotine or tobacco. These products include cigarettes, chewing tobacco, and vaping devices, such as e-cigarettes. If you need help quitting, ask your health care provider. Do not use drugs. If you are sexually active, practice safe sex. Use a condom or other form of protection to   prevent STIs. If you do not wish to become pregnant, use a form of birth control. If you plan to become pregnant, see your health care provider for a  prepregnancy visit. Take aspirin only as told by your health care provider. Make sure that you understand how much to take and what form to take. Work with your health care provider to find out whether it is safe and beneficial for you to take aspirin daily. Find healthy ways to manage stress, such as: Meditation, yoga, or listening to music. Journaling. Talking to a trusted person. Spending time with friends and family. Minimize exposure to UV radiation to reduce your risk of skin cancer. Safety Always wear your seat belt while driving or riding in a vehicle. Do not drive: If you have been drinking alcohol. Do not ride with someone who has been drinking. When you are tired or distracted. While texting. If you have been using any mind-altering substances or drugs. Wear a helmet and other protective equipment during sports activities. If you have firearms in your house, make sure you follow all gun safety procedures. Seek help if you have been physically or sexually abused. What's next? Visit your health care provider once a year for an annual wellness visit. Ask your health care provider how often you should have your eyes and teeth checked. Stay up to date on all vaccines. This information is not intended to replace advice given to you by your health care provider. Make sure you discuss any questions you have with your health care provider. Document Revised: 03/03/2021 Document Reviewed: 03/03/2021 Elsevier Patient Education  2024 Elsevier Inc.  

## 2023-10-07 LAB — MICROALBUMIN / CREATININE URINE RATIO
Creatinine, Urine: 384.5 mg/dL
Microalb/Creat Ratio: 8 mg/g{creat} (ref 0–29)
Microalbumin, Urine: 29.6 ug/mL

## 2023-10-09 ENCOUNTER — Encounter: Payer: Self-pay | Admitting: Family Medicine

## 2023-10-25 LAB — HM DIABETES EYE EXAM

## 2023-10-26 DIAGNOSIS — H5213 Myopia, bilateral: Secondary | ICD-10-CM | POA: Diagnosis not present

## 2023-11-17 ENCOUNTER — Other Ambulatory Visit (HOSPITAL_BASED_OUTPATIENT_CLINIC_OR_DEPARTMENT_OTHER): Payer: Self-pay

## 2023-11-23 ENCOUNTER — Telehealth: Payer: Self-pay

## 2023-11-23 ENCOUNTER — Other Ambulatory Visit (HOSPITAL_COMMUNITY): Payer: Self-pay

## 2023-11-23 NOTE — Telephone Encounter (Signed)
 Pharmacy Patient Advocate Encounter  Received notification from [facility] that Prior Authorization for [employer] 3 Sensor  has been  SENT TO PLAN PENDING QUESTION    Key: [id number]

## 2023-11-27 NOTE — Telephone Encounter (Signed)
 Why would this be?  She's insulin dependent

## 2023-11-27 NOTE — Telephone Encounter (Signed)
 Pharmacy Patient Advocate Encounter  Received notification from [facility] that Prior Authorization for [facility] 3 Sensor has been DENIED.  Full denial letter will be uploaded to the media tab. See denial reason below.

## 2023-12-04 ENCOUNTER — Telehealth: Payer: Self-pay

## 2023-12-04 NOTE — Telephone Encounter (Signed)
 PA request has been  Resubmitted . New Encounter has been or will be created for follow up. For additional info see Pharmacy Prior Auth telephone encounter from 12/04/23.

## 2023-12-04 NOTE — Telephone Encounter (Addendum)
 Pharmacy Patient Advocate Encounter   Received notification from Pt Calls Messages that prior authorization for FreeStyle Libre 3 Sensor is required/requested.   Insurance verification completed.   The patient is insured through [facility] .   Per test claim: PA required; PA submitted to above mentioned insurance via CoverMyMeds Key/confirmation #/EOC [id number] Status is pending    *Problem with [employer] and CMM. Resubmitted - New key: [id number]*

## 2023-12-19 ENCOUNTER — Other Ambulatory Visit (HOSPITAL_COMMUNITY): Payer: Self-pay

## 2023-12-19 NOTE — Telephone Encounter (Signed)
 Pharmacy Patient Advocate Encounter  Received notification from [facility] that Prior Authorization for FreeStyle Libre 3 Plus Sensor has been APPROVED from 12/19/23 to 06/16/24. Ran test claim, Copay is $0. This test claim was processed through [facility]- copay amounts may vary at other pharmacies due to pharmacy/plan contracts, or as the patient moves through the different stages of their insurance plan.   PA #/Case ID/Reference #: [id number]

## 2023-12-20 NOTE — Telephone Encounter (Signed)
 PLEASE BE ADVISED APPROVAL LETTER HAS BEEN SCANNED IN MEDIA OF CHART

## 2024-02-14 ENCOUNTER — Other Ambulatory Visit: Payer: Self-pay | Admitting: Obstetrics & Gynecology

## 2024-03-28 ENCOUNTER — Other Ambulatory Visit: Payer: Self-pay | Admitting: Family Medicine

## 2024-03-28 ENCOUNTER — Telehealth: Payer: Self-pay

## 2024-03-28 DIAGNOSIS — Z1231 Encounter for screening mammogram for malignant neoplasm of breast: Secondary | ICD-10-CM

## 2024-03-28 NOTE — Telephone Encounter (Signed)
 Copied from CRM [id number]. Topic: Clinical - Request for Lab/Test Order >> Mar 28, 2024  9:37 AM Laurier BROCKS wrote: Reason for CRM: Patient states she recv'd letter from the breast center. Patient states letter is asking that provider add diagnostic codes to the order.

## 2024-04-05 ENCOUNTER — Encounter: Payer: Self-pay | Admitting: Family Medicine

## 2024-04-05 ENCOUNTER — Ambulatory Visit (INDEPENDENT_AMBULATORY_CARE_PROVIDER_SITE_OTHER): Payer: Medicaid Other

## 2024-04-05 ENCOUNTER — Ambulatory Visit: Payer: Medicaid Other | Admitting: Family Medicine

## 2024-04-05 VITALS — BP 88/58 | HR 67 | Temp 97.9°F | Ht 62.0 in | Wt 122.0 lb

## 2024-04-05 DIAGNOSIS — Z794 Long term (current) use of insulin: Secondary | ICD-10-CM

## 2024-04-05 DIAGNOSIS — E119 Type 2 diabetes mellitus without complications: Secondary | ICD-10-CM | POA: Diagnosis not present

## 2024-04-05 DIAGNOSIS — B351 Tinea unguium: Secondary | ICD-10-CM | POA: Diagnosis not present

## 2024-04-05 DIAGNOSIS — E1169 Type 2 diabetes mellitus with other specified complication: Secondary | ICD-10-CM

## 2024-04-05 DIAGNOSIS — E785 Hyperlipidemia, unspecified: Secondary | ICD-10-CM | POA: Diagnosis not present

## 2024-04-05 DIAGNOSIS — F411 Generalized anxiety disorder: Secondary | ICD-10-CM

## 2024-04-05 DIAGNOSIS — Z780 Asymptomatic menopausal state: Secondary | ICD-10-CM | POA: Diagnosis not present

## 2024-04-05 DIAGNOSIS — F410 Panic disorder [episodic paroxysmal anxiety] without agoraphobia: Secondary | ICD-10-CM

## 2024-04-05 LAB — BAYER DCA HB A1C WAIVED: HB A1C (BAYER DCA - WAIVED): 6 % — ABNORMAL HIGH (ref 4.8–5.6)

## 2024-04-05 MED ORDER — BUSPIRONE HCL 5 MG PO TABS: ORAL_TABLET | ORAL | 1 refills | Status: DC

## 2024-04-05 MED ORDER — TERBINAFINE HCL 250 MG PO TABS: 250.0000 mg | ORAL_TABLET | Freq: Every day | ORAL | 0 refills | Status: AC

## 2024-04-05 NOTE — Progress Notes (Signed)
 Subjective: CC:DM PCP: Jolinda Tami HERO, DO YEP:Cprunmpj [name] is a 43 y.o. female presenting to clinic today for:  1. 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  Overall feeling well.  Not having blood sugars in the 200s or anything superhigh  Diabetes Health Maintenance Due  Topic Date Due   OPHTHALMOLOGY EXAM  Never done   HEMOGLOBIN A1C  04/04/2024   FOOT EXAM  10/05/2024    Last A1c:  Lab Results  Component Value Date   HGBA1C 6.0 (H) 10/06/2023    ROS: Chest pain, shortness of breath.  She does report recurrence of the fungal infection between her toes again and would like to go back on Lamisil   2.  Anxiety disorder She has got history of generalized anxiety disorder with panic attack and difficulty with sleeping.  She has been managed by her OB/GYN for years with Lexapro  20 mg and some level of benzodiazepine.  Previously on Ativan  and then switched over to Xanax  1 mg multiple times per day but then ultimately de-escalated to 0.5 mg of alprazolam  daily as needed.  She feels like maybe she has gotten used to this dose and wonders what she should do because she is having increasing anxiety and difficulty with sleep.  She takes Pamelor daily for neuropathy.  Does not feel that it makes her sleepy.  Looks to have been treated with Zoloft many years ago.  ROS: Per HPI  Allergies  Allergen Reactions   Celebrex [Celecoxib]    Latex    Sulfonamide Derivatives    Metformin And Related Nausea Only   Ozempic (0.25 Or 0.5 Mg-Dose) [Semaglutide(0.25 Or 0.5mg -Dos)] Nausea And Vomiting   Victoza [Liraglutide] Nausea And Vomiting   Past Medical History:  Diagnosis Date   Abdominal pain, left lower quadrant 04/09/2010   Qualifier: Diagnosis of   By: Jakie MD NOLIA Alm SAUNDERS        Anemia    Anxiety    Constipation    Diabetes mellitus without  complication (HCC)    Family history of malignant neoplasm of gastrointestinal tract    Gastroparesis    Hemorrhage of rectum and anus 03/08/2010   Qualifier: Diagnosis of   By: Surface RN, Arland         IBS (irritable bowel syndrome)    constipation   Migraines    Neuropathy due to type 2 diabetes mellitus (HCC)     Current Outpatient Medications:    Accu-Chek FastClix Lancets MISC, Use to monitor blood glucose 3 time(s) daily, Disp: , Rfl:    ALPRAZolam  (XANAX ) 0.5 MG tablet, TAKE ONE TABLET DAILY AS NEEDED, Disp: 60 tablet, Rfl: 5   atorvastatin  (LIPITOR) 10 MG tablet, Take 1 tablet (10 mg total) by mouth daily., Disp: 90 tablet, Rfl: 3   Blood Glucose Monitoring Suppl (ACCU-CHEK GUIDE) w/Device KIT, 1 each by Does not applyoroute once for 1 dose. Use as directed by physician to monitor blood sugars, Disp: , Rfl:    Continuous Glucose Sensor (FREESTYLE LIBRE 3 PLUS SENSOR) MISC, Monitor sugar continuously, E11.9. Change sensor every 15 days., Disp: 6 each, Rfl: 4   diclofenac Sodium (VOLTAREN) 1 % GEL, APPLY 2 GRAMS TO AFFECTED JOINT(S) FOUR TIMES DAILY, Disp: , Rfl:    escitalopram  (LEXAPRO ) 20 MG tablet, TAKE ONE TABLET BY MOUTH DAILY, Disp: 30 tablet, Rfl: 11   gabapentin  (NEURONTIN ) 300 MG capsule,  Take 300 mg by mouth 4 (four) times daily., Disp: , Rfl:    gabapentin  (NEURONTIN ) 600 MG tablet, Take 600 mg by mouth 3 (three) times daily., Disp: , Rfl:    insulin  glargine (LANTUS  SOLOSTAR) 100 UNIT/ML Solostar Pen, Inject 20-25 Units into the skin at bedtime. INJECT 25 UNITS INTO SKIN AT BEDTIME, Disp: 15 mL, Rfl: 4   Insulin  Pen Needle (BD PEN NEEDLE NANO U/F) 32G X 4 MM MISC, Use with insulin   E11.65, Disp: 100 each, Rfl: 3   magnesium oxide (MAG-OX) 400 MG tablet, Take 400 mg by mouth daily., Disp: , Rfl:    montelukast  (SINGULAIR ) 10 MG tablet, Take 1 tablet (10 mg total) by mouth at bedtime. For allergies, Disp: 90 tablet, Rfl: 3   nortriptyline (PAMELOR) 75 MG capsule, Take 75 mg  by mouth daily., Disp: , Rfl:    pantoprazole  (PROTONIX ) 40 MG tablet, TAKE ONE TABLET BY MOUTH DAILY, Disp: 30 tablet, Rfl: 11   PAZEO 0.7 % SOLN, Apply 1 drop to eye every morning., Disp: , Rfl:    RESTASIS 0.05 % ophthalmic emulsion, 1 drop 2 (two) times daily., Disp: , Rfl:    sitaGLIPtin  (JANUVIA ) 100 MG tablet, Take 1 tablet (100 mg total) by mouth daily., Disp: 90 tablet, Rfl: 3   tiZANidine (ZANAFLEX) 4 MG tablet, Take 4 mg by mouth 2 (two) times daily., Disp: , Rfl:    valACYclovir  (VALTREX ) 1000 MG tablet, TAKE ONE TABLET ONCE DAILY, Disp: 30 tablet, Rfl: 11 Social History   Socioeconomic History   Marital status: Divorced    Spouse name: Not on file   Number of children: 2   Years of education: Not on file   Highest education level: Some college, no degree  Occupational History   Occupation: Comptroller    Comment: part time   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  Tobacco Use   Smoking status: Former    Current packs/day: 0.00    Types: Cigarettes    Start date: 06/24/2004    Quit date: 06/24/2014    Years since quitting: 9.7   Smokeless tobacco: Never  Vaping Use   Vaping status: Never Used  Substance and Sexual Activity   Alcohol use: Yes    Comment: once in a while   Drug use: No   Sexual activity: Not Currently    Birth control/protection: Surgical    Comment: hyst  Other Topics Concern   Not on file  Social History Narrative   Not on file   Social Drivers of Health   Financial Resource Strain: Low Risk  (10/06/2023)   Overall Financial Resource Strain (CARDIA)    Difficulty of Paying Living Expenses: Not hard at all  Food Insecurity: No Food Insecurity (10/06/2023)   Hunger Vital Sign    Worried About Running Out of Food in the Last Year: Never true    Ran Out of Food in the Last Year: Never true  Transportation Needs: No Transportation Needs (10/06/2023)   PRAPARE - Administrator, Civil Service (Medical): No     Lack of Transportation (Non-Medical): No  Physical Activity: Inactive (10/06/2023)   Exercise Vital Sign    Days of Exercise per Week: 0 days    Minutes of Exercise per Session: 0 min  Stress: Stress Concern Present (10/06/2023)   [employer] of Occupational Health - Occupational Stress Questionnaire    Feeling of Stress : To some extent  Social Connections:  Unknown (10/06/2023)   Social Connection and Isolation Panel    Frequency of Communication with Friends and Family: Twice a week    Frequency of Social Gatherings with Friends and Family: Twice a week    Attends Religious Services: More than 4 times per year    Active Member of [employer] or Organizations: No    Attends Banker Meetings: Never    Marital Status: Not on file  Intimate Partner Violence: Not At Risk (10/06/2023)   Humiliation, Afraid, Rape, and Kick questionnaire    Fear of Current or Ex-Partner: No    Emotionally Abused: No    Physically Abused: No    Sexually Abused: No   Family History  Problem Relation Age of Onset   Other Paternal Grandfather        fell down stairs   Other Paternal Grandmother        fell down stairs   Alzheimer's disease Maternal Grandmother    Dementia Maternal Grandmother    Colon cancer Maternal Grandfather    Other Mother        back pain; nerve damage   Hypertension Mother    Hemophilia Son     Objective: Office vital signs reviewed. BP (!) 88/58   Pulse 67   Temp 97.9 F (36.6 C)   Ht 5' 2 (1.575 m)   Wt 122 lb (55.3 kg)   LMP 06/12/2016   SpO2 95%   BMI 22.31 kg/m   Physical Examination:  General: Awake, alert, well nourished, No acute distress HEENT: sclera white, MMM.  Poor dentition Cardio: regular rate and rhythm, S1S2 heard, no murmurs appreciated Pulm: clear to auscultation bilaterally, no wheezes, rhonchi or rales; normal work of breathing on room air Skin: Some onychomycotic changes noted to the second digits of bilateral feet.  She has  some maceration interdigitally of several toes bilaterally     04/05/2024    8:20 AM 10/06/2023    8:54 AM 03/07/2023   11:30 AM  Depression screen PHQ 2/9  Decreased Interest 1 0 1  Down, Depressed, Hopeless 0 0 0  PHQ - 2 Score 1 0 1  Altered sleeping 3 1 2   Tired, decreased energy 1 1 1   Change in appetite 0 1 1  Feeling bad or failure about yourself  0 0 0  Trouble concentrating 0 0 0  Moving slowly or fidgety/restless 0 0 0  Suicidal thoughts 0 0 0  PHQ-9 Score 5 3 5   Difficult doing work/chores Not difficult at all Not difficult at all       04/05/2024    8:20 AM 10/06/2023    8:55 AM 03/07/2023   11:30 AM 09/13/2022   12:47 PM  GAD 7 : Generalized Anxiety Score  Nervous, Anxious, on Edge 2 1 1 1   Control/stop worrying 1 1 1 1   Worry too much - different things 1 1 1 1   Trouble relaxing 2 0 1 1  Restless 1 1 1  0  Easily annoyed or irritable 2 0 1 1  Afraid - awful might happen 0 0 0 0  Total GAD 7 Score 9 4 6 5   Anxiety Difficulty Somewhat difficult Not difficult at all  Not difficult at all    Assessment/ Plan: 43 y.o. female   Insulin -requiring or dependent type II diabetes mellitus (HCC) - Plan: Bayer DCA Hb A1c Waived  Hyperlipidemia associated with type 2 diabetes mellitus (HCC)  Generalized anxiety disorder with panic attacks - Plan: busPIRone (BUSPAR)  5 MG tablet  Onychomycosis - Plan: terbinafine  (LAMISIL ) 250 MG tablet  Sugar under excellent control with A1c at 6.0 today.  No changes continue all medications as prescribed  We will add BuSpar and titrate up.  Advised her to start weaning from Xanax .  She is on a high risk regimen already with tricyclic antidepressant combined with SSRI.  However, she seems to be stable with this from a cardiac standpoint.  No evidence of serotonin syndrome.  I did discuss with her that it may be worth trialing her off of Lexapro  at some point and may be switching her over to Cymbalta since she has chronic pain and  neuropathic issues.  It may be beneficial to her.  I could not find in the chart where this had ever been tried for her.  I will see her back in 6 weeks for LFTs since we are adding Lamisil  for the next 3 months.  We will recheck her mood at that time as well  Tami CHRISTELLA Fielding, DO [facility] [phone]

## 2024-04-17 DIAGNOSIS — M810 Age-related osteoporosis without current pathological fracture: Secondary | ICD-10-CM | POA: Diagnosis not present

## 2024-04-17 DIAGNOSIS — Z780 Asymptomatic menopausal state: Secondary | ICD-10-CM | POA: Diagnosis not present

## 2024-04-19 ENCOUNTER — Ambulatory Visit: Payer: Self-pay | Admitting: Family Medicine

## 2024-04-19 DIAGNOSIS — M818 Other osteoporosis without current pathological fracture: Secondary | ICD-10-CM | POA: Insufficient documentation

## 2024-05-21 ENCOUNTER — Other Ambulatory Visit: Payer: Self-pay

## 2024-05-21 ENCOUNTER — Encounter: Payer: Self-pay | Admitting: Family Medicine

## 2024-05-21 ENCOUNTER — Ambulatory Visit: Admitting: Family Medicine

## 2024-05-21 VITALS — BP 100/66 | HR 73 | Temp 97.9°F | Ht 62.0 in | Wt 124.0 lb

## 2024-05-21 DIAGNOSIS — F411 Generalized anxiety disorder: Secondary | ICD-10-CM

## 2024-05-21 DIAGNOSIS — B351 Tinea unguium: Secondary | ICD-10-CM

## 2024-05-21 DIAGNOSIS — M818 Other osteoporosis without current pathological fracture: Secondary | ICD-10-CM

## 2024-05-21 DIAGNOSIS — F410 Panic disorder [episodic paroxysmal anxiety] without agoraphobia: Secondary | ICD-10-CM

## 2024-05-21 DIAGNOSIS — Z2821 Immunization not carried out because of patient refusal: Secondary | ICD-10-CM | POA: Diagnosis not present

## 2024-05-21 MED ORDER — BUSPIRONE HCL 15 MG PO TABS: 15.0000 mg | ORAL_TABLET | Freq: Three times a day (TID) | ORAL | 3 refills | Status: AC

## 2024-05-21 NOTE — Progress Notes (Signed)
 Subjective: CC: foot fungus, GAD PCP: Jolinda Norene HERO, DO YEP:Cprunmpj [name] is a 43 y.o. female presenting to clinic today for:  Discussed the use of AI scribe software for clinical note transcription with the patient, who gave verbal consent to proceed.  History of Present Illness   [name] is a 43 year old female who presents for a follow-up on antifungal treatment and anxiety management.  She is currently undergoing treatment with Lamisil  250 mg daily for nail fungus. She reports that her feet/ nails are getting better and the fungus appears to be growing out. No abdominal pain or nausea.  For anxiety management, she has been taking Buspar  three times a day and recently increased her dose to 10mg  three times a day. She was previously on Xanax  but is weaning off due to potential risks and tolerance issues.  She is treated with Pamelor for neuropathy.  Her last A1c was controlled.      ROS: Per HPI  Allergies  Allergen Reactions   Celebrex [Celecoxib]    Latex    Sulfonamide Derivatives    Metformin And Related Nausea Only   Ozempic (0.25 Or 0.5 Mg-Dose) [Semaglutide(0.25 Or 0.5mg -Dos)] Nausea And Vomiting   Victoza [Liraglutide] Nausea And Vomiting   Past Medical History:  Diagnosis Date   Abdominal pain, left lower quadrant 04/09/2010   Qualifier: Diagnosis of   By: Jakie MD NOLIA Alm SAUNDERS        Anemia    Anxiety    Constipation    Diabetes mellitus without complication (HCC)    Family history of malignant neoplasm of gastrointestinal tract    Gastroparesis    Hemorrhage of rectum and anus 03/08/2010   Qualifier: Diagnosis of   By: Surface RN, Arland         IBS (irritable bowel syndrome)    constipation   Migraines    Neuropathy due to type 2 diabetes mellitus (HCC)     Current Outpatient Medications:    tiZANidine (ZANAFLEX) 4 MG tablet, Take 4 mg by mouth 2 (two) times daily., Disp: , Rfl:    valACYclovir  (VALTREX ) 1000 MG tablet, TAKE  ONE TABLET ONCE DAILY, Disp: 30 tablet, Rfl: 11   Accu-Chek FastClix Lancets MISC, Use to monitor blood glucose 3 time(s) daily, Disp: , Rfl:    ALPRAZolam  (XANAX ) 0.5 MG tablet, TAKE ONE TABLET DAILY AS NEEDED, Disp: 60 tablet, Rfl: 5   atorvastatin  (LIPITOR) 10 MG tablet, Take 1 tablet (10 mg total) by mouth daily., Disp: 90 tablet, Rfl: 3   Blood Glucose Monitoring Suppl (ACCU-CHEK GUIDE) w/Device KIT, 1 each by Does not applyoroute once for 1 dose. Use as directed by physician to monitor blood sugars, Disp: , Rfl:    busPIRone  (BUSPAR ) 15 MG tablet, Take 1 tablet (15 mg total) by mouth 3 (three) times daily. For anxiety, Disp: 270 tablet, Rfl: 3   Continuous Glucose Sensor (FREESTYLE LIBRE 3 PLUS SENSOR) MISC, Monitor sugar continuously, E11.9. Change sensor every 15 days., Disp: 6 each, Rfl: 4   diclofenac Sodium (VOLTAREN) 1 % GEL, APPLY 2 GRAMS TO AFFECTED JOINT(S) FOUR TIMES DAILY, Disp: , Rfl:    escitalopram  (LEXAPRO ) 20 MG tablet, TAKE ONE TABLET BY MOUTH DAILY, Disp: 30 tablet, Rfl: 11   gabapentin  (NEURONTIN ) 300 MG capsule, Take 300 mg by mouth 4 (four) times daily., Disp: , Rfl:    gabapentin  (NEURONTIN ) 600 MG tablet, Take 600 mg by mouth 3 (three) times daily., Disp: , Rfl:  insulin  glargine (LANTUS  SOLOSTAR) 100 UNIT/ML Solostar Pen, Inject 20-25 Units into the skin at bedtime. INJECT 25 UNITS INTO SKIN AT BEDTIME, Disp: 15 mL, Rfl: 4   Insulin  Pen Needle (BD PEN NEEDLE NANO U/F) 32G X 4 MM MISC, Use with insulin   E11.65, Disp: 100 each, Rfl: 3   magnesium oxide (MAG-OX) 400 MG tablet, Take 400 mg by mouth daily., Disp: , Rfl:    montelukast  (SINGULAIR ) 10 MG tablet, Take 1 tablet (10 mg total) by mouth at bedtime. For allergies, Disp: 90 tablet, Rfl: 3   nortriptyline (PAMELOR) 75 MG capsule, Take 75 mg by mouth daily., Disp: , Rfl:    pantoprazole  (PROTONIX ) 40 MG tablet, TAKE ONE TABLET BY MOUTH DAILY, Disp: 30 tablet, Rfl: 11   PAZEO 0.7 % SOLN, Apply 1 drop to eye every  morning., Disp: , Rfl:    RESTASIS 0.05 % ophthalmic emulsion, 1 drop 2 (two) times daily., Disp: , Rfl:    sitaGLIPtin  (JANUVIA ) 100 MG tablet, Take 1 tablet (100 mg total) by mouth daily., Disp: 90 tablet, Rfl: 3   terbinafine  (LAMISIL ) 250 MG tablet, Take 1 tablet (250 mg total) by mouth daily., Disp: 90 tablet, Rfl: 0 Social History   Socioeconomic History   Marital status: Divorced    Spouse name: Not on file   Number of children: 2   Years of education: Not on file   Highest education level: Some college, no degree  Occupational History   Occupation: Comptroller    Comment: part time   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  Tobacco Use   Smoking status: Former    Current packs/day: 0.00    Types: Cigarettes    Start date: 06/24/2004    Quit date: 06/24/2014    Years since quitting: 9.9   Smokeless tobacco: Never  Vaping Use   Vaping status: Never Used  Substance and Sexual Activity   Alcohol use: Yes    Comment: once in a while   Drug use: No   Sexual activity: Not Currently    Birth control/protection: Surgical    Comment: hyst  Other Topics Concern   Not on file  Social History Narrative   Not on file   Social Drivers of Health   Financial Resource Strain: Low Risk  (10/06/2023)   Overall Financial Resource Strain (CARDIA)    Difficulty of Paying Living Expenses: Not hard at all  Food Insecurity: No Food Insecurity (10/06/2023)   Hunger Vital Sign    Worried About Running Out of Food in the Last Year: Never true    Ran Out of Food in the Last Year: Never true  Transportation Needs: No Transportation Needs (10/06/2023)   PRAPARE - Administrator, Civil Service (Medical): No    Lack of Transportation (Non-Medical): No  Physical Activity: Inactive (10/06/2023)   Exercise Vital Sign    Days of Exercise per Week: 0 days    Minutes of Exercise per Session: 0 min  Stress: Stress Concern Present (10/06/2023)   [employer] of Occupational Health - Occupational Stress Questionnaire    Feeling of Stress : To some extent  Social Connections: Unknown (10/06/2023)   Social Connection and Isolation Panel    Frequency of Communication with Friends and Family: Twice a week    Frequency of Social Gatherings with Friends and Family: Twice a week    Attends Religious Services: More than 4 times per year  Active Member of Clubs or Organizations: No    Attends Banker Meetings: Never    Marital Status: Not on file  Intimate Partner Violence: Not At Risk (10/06/2023)   Humiliation, Afraid, Rape, and Kick questionnaire    Fear of Current or Ex-Partner: No    Emotionally Abused: No    Physically Abused: No    Sexually Abused: No   Family History  Problem Relation Age of Onset   Other Paternal Grandfather        fell down stairs   Other Paternal Grandmother        fell down stairs   Alzheimer's disease Maternal Grandmother    Dementia Maternal Grandmother    Colon cancer Maternal Grandfather    Other Mother        back pain; nerve damage   Hypertension Mother    Hemophilia Son     Objective: Office vital signs reviewed. BP 100/66   Pulse 73   Temp 97.9 F (36.6 C)   Ht 5' 2 (1.575 m)   Wt 124 lb (56.2 kg)   LMP 06/12/2016   SpO2 97%   BMI 22.68 kg/m   Physical Examination:  General: Awake, alert, well nourished, No acute distress HEENT: no scleral icterus Cardio: regular rate and rhythm Skin: maceration between toes resolved.     05/21/2024   10:34 AM 04/05/2024    8:20 AM 10/06/2023    8:54 AM  Depression screen PHQ 2/9  Decreased Interest 0 1 0  Down, Depressed, Hopeless 0 0 0  PHQ - 2 Score 0 1 0  Altered sleeping 1 3 1   Tired, decreased energy 0 1 1  Change in appetite 1 0 1  Feeling bad or failure about yourself  0 0 0  Trouble concentrating 1 0 0  Moving slowly or fidgety/restless 0 0 0  Suicidal thoughts 0 0 0  PHQ-9 Score 3 5 3   Difficult doing work/chores   Not difficult at all Not difficult at all      05/21/2024   10:35 AM 04/05/2024    8:20 AM 10/06/2023    8:55 AM 03/07/2023   11:30 AM  GAD 7 : Generalized Anxiety Score  Nervous, Anxious, on Edge 1 2 1 1   Control/stop worrying  1 1 1   Worry too much - different things  1 1 1   Trouble relaxing 1 2 0 1  Restless  1 1 1   Easily annoyed or irritable 1 2 0 1  Afraid - awful might happen  0 0 0  Total GAD 7 Score  9 4 6   Anxiety Difficulty  Somewhat difficult Not difficult at all    Assessment/ Plan: 43 y.o. female   Generalized anxiety disorder with panic attacks - Plan: busPIRone  (BUSPAR ) 15 MG tablet  Onychomycosis - Plan: Hepatic function panel  Flu vaccine refused  Assessment and Plan    Onychomycosis (nail fungus) Improvement with Lamisil . No side effects reported. Medication expected to remain effective for up to 16 months. - Perform liver function tests (LFTs).  Anxiety disorder Currently on Buspar . She reported needing a higher dose. - Increase Buspar  to 15 mg three times daily. - Send prescription for Buspar  15 mg to [facility].     Norene CHRISTELLA Fielding, DO [facility] [phone]

## 2024-05-21 NOTE — Progress Notes (Signed)
 Patient was seen today by PCP for a follow up and labs were done.

## 2024-05-22 ENCOUNTER — Ambulatory Visit: Payer: Self-pay | Admitting: Family Medicine

## 2024-05-22 ENCOUNTER — Encounter: Payer: Self-pay | Admitting: Family Medicine

## 2024-05-22 LAB — PTH, INTACT AND CALCIUM
Calcium: 9.3 mg/dL (ref 8.7–10.2)
PTH: 25 pg/mL (ref 15–65)

## 2024-05-22 LAB — HEPATIC FUNCTION PANEL
ALT: 19 IU/L (ref 0–32)
AST: 20 IU/L (ref 0–40)
Albumin: 4 g/dL (ref 3.9–4.9)
Alkaline Phosphatase: 106 IU/L (ref 44–121)
Bilirubin Total: 0.5 mg/dL (ref 0.0–1.2)
Bilirubin, Direct: 0.17 mg/dL (ref 0.00–0.40)
Total Protein: 6.4 g/dL (ref 6.0–8.5)

## 2024-05-22 LAB — VITAMIN D 25 HYDROXY (VIT D DEFICIENCY, FRACTURES): Vit D, 25-Hydroxy: 31.2 ng/mL (ref 30.0–100.0)

## 2024-05-29 DIAGNOSIS — M549 Dorsalgia, unspecified: Secondary | ICD-10-CM | POA: Diagnosis not present

## 2024-06-03 DIAGNOSIS — M79671 Pain in right foot: Secondary | ICD-10-CM | POA: Diagnosis not present

## 2024-06-03 DIAGNOSIS — Z5321 Procedure and treatment not carried out due to patient leaving prior to being seen by health care provider: Secondary | ICD-10-CM | POA: Diagnosis not present

## 2024-06-03 DIAGNOSIS — S99921A Unspecified injury of right foot, initial encounter: Secondary | ICD-10-CM | POA: Diagnosis not present

## 2024-06-04 ENCOUNTER — Ambulatory Visit (INDEPENDENT_AMBULATORY_CARE_PROVIDER_SITE_OTHER)

## 2024-06-04 ENCOUNTER — Encounter: Payer: Self-pay | Admitting: Family Medicine

## 2024-06-04 ENCOUNTER — Ambulatory Visit: Payer: Self-pay

## 2024-06-04 ENCOUNTER — Ambulatory Visit: Admitting: Family Medicine

## 2024-06-04 VITALS — BP 98/64 | HR 68 | Temp 96.0°F | Ht 62.0 in | Wt 124.0 lb

## 2024-06-04 DIAGNOSIS — M25551 Pain in right hip: Secondary | ICD-10-CM

## 2024-06-04 DIAGNOSIS — M25562 Pain in left knee: Secondary | ICD-10-CM | POA: Diagnosis not present

## 2024-06-04 DIAGNOSIS — W19XXXA Unspecified fall, initial encounter: Secondary | ICD-10-CM

## 2024-06-04 DIAGNOSIS — S93491A Sprain of other ligament of right ankle, initial encounter: Secondary | ICD-10-CM

## 2024-06-04 DIAGNOSIS — M25569 Pain in unspecified knee: Secondary | ICD-10-CM | POA: Diagnosis not present

## 2024-06-04 DIAGNOSIS — M25561 Pain in right knee: Secondary | ICD-10-CM | POA: Diagnosis not present

## 2024-06-04 MED ORDER — KETOROLAC TROMETHAMINE 60 MG/2ML IM SOLN
60.0000 mg | Freq: Once | INTRAMUSCULAR | Status: AC
  Administered 2024-06-04: 60 mg via INTRAMUSCULAR

## 2024-06-04 MED ORDER — ACETAMINOPHEN-CODEINE 300-30 MG PO TABS: 1.0000 | ORAL_TABLET | ORAL | 0 refills | Status: AC | PRN

## 2024-06-04 NOTE — Telephone Encounter (Signed)
 FYI Only or Action Required?: FYI only for provider.  Patient was last seen in primary care on 05/21/2024 by Jolinda Norene HERO, DO.  Called Nurse Triage reporting Ankle Injury.  Symptoms began yesterday.  Interventions attempted: OTC medications: Tylenol , Prescription medications: and old RX of Oxycodone , Ice/heat application, and Other: went to the ED yesterday and got triaged but LWBS after waiting for 3 hours.  Symptoms are: gradually worsening.  Triage Disposition: See HCP Within 4 Hours (Or PCP Triage)  Patient/caregiver understands and will follow disposition?: Yes  Copied from CRM #[id number]. Topic: Clinical - Red Word Triage >> Jun 04, 2024  8:40 AM Ivette P wrote: [facility] that prompted transfer to Nurse Triage: pt slipped and missed last 3 steps. felt and heard foot popped. foot swelled. went to ER and waited for 3 hours and wasnt seen. was told 3-5 hours. attempted to sleep and cannot sleep. can barley put any weight on it at all. use left leg to slide the right one. Reason for Disposition  [1] SEVERE pain AND [2] not improved 2 hours after pain medicine/ice packs  Answer Assessment - Initial Assessment Questions 1. MECHANISM: How did the injury happen? (e.g., twisting injury, direct blow)      Going down to her basement, missed last 3 steps and landed on basement floor  2. ONSET: When did the injury happen? (Minutes or hours ago)      Yesterday  3. LOCATION: Where is the injury located?      Left knee, pain in right wrist, elbow, and hip  4. APPEARANCE of INJURY: What does the injury look like?      Swelling in right foot   5. WEIGHT-BEARING: Can you put weight on that foot? Can you walk (four steps or more)?       Very light weight but its painful  6. SIZE: For cuts, bruises, or swelling, ask: How large is it? (e.g., inches or centimeters;  entire joint)   Bruising and swelling, slight abraison on elbow  7. PAIN: Is there pain? If Yes, ask: How bad  is the pain?    (e.g., Scale 1-10; or mild, moderate, severe)   - NONE (0): no pain.   - MILD (1-3): doesn't interfere with normal activities.    - MODERATE (4-7): interferes with normal activities (e.g., work or school) or awakens from sleep, limping.    - SEVERE (8-10): excruciating pain, unable to do any normal activities, unable to walk.      Took some old oxycodone  but it didn't help, pain level 9/10  8. TETANUS: For any breaks in the skin, ask: When was the last tetanus booster?     Unsure  9. OTHER SYMPTOMS: Do you have any other symptoms?      No  10. PREGNANCY: Is there any chance you are pregnant? When was your last menstrual period?       No  Protocols used: Ankle and Foot Injury-A-AH
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
## 2024-06-04 NOTE — Telephone Encounter (Signed)
 Pt has appt

## 2024-06-10 ENCOUNTER — Ambulatory Visit: Payer: Self-pay | Admitting: Family Medicine

## 2024-06-12 ENCOUNTER — Other Ambulatory Visit: Payer: Self-pay | Admitting: Obstetrics & Gynecology

## 2024-06-18 ENCOUNTER — Other Ambulatory Visit: Payer: Self-pay | Admitting: Family Medicine

## 2024-06-18 DIAGNOSIS — F410 Panic disorder [episodic paroxysmal anxiety] without agoraphobia: Secondary | ICD-10-CM

## 2024-06-20 ENCOUNTER — Telehealth: Payer: Self-pay

## 2024-06-20 ENCOUNTER — Other Ambulatory Visit (HOSPITAL_COMMUNITY): Payer: Self-pay

## 2024-06-20 NOTE — Telephone Encounter (Signed)
 Pharmacy Patient Advocate Encounter   Received notification from Onbase that prior authorization for FreeStyle Libre 3 Plus Sensor  is required/requested.   Insurance verification completed.   The patient is insured through [employer].   Per test claim: PA required; PA submitted to above mentioned insurance via Latent Key/confirmation #/EOC [id number] Status is pending

## 2024-06-20 NOTE — Telephone Encounter (Signed)
 Pharmacy Patient Advocate Encounter  Received notification from [employer] that Prior Authorization for  FreeStyle Libre 3 Plus Sensor  has been APPROVED from 06/20/24 to 06/20/25. Ran test claim, Copay is $0. This test claim was processed through [facility]- copay amounts may vary at other pharmacies due to pharmacy/plan contracts, or as the patient moves through the different stages of their insurance plan.   PA #/Case ID/Reference #: [id number]

## 2024-06-21 ENCOUNTER — Ambulatory Visit (INDEPENDENT_AMBULATORY_CARE_PROVIDER_SITE_OTHER)

## 2024-06-21 ENCOUNTER — Encounter: Payer: Self-pay | Admitting: Family Medicine

## 2024-06-21 ENCOUNTER — Ambulatory Visit: Admitting: Family Medicine

## 2024-06-21 ENCOUNTER — Ambulatory Visit: Payer: Self-pay | Admitting: Family Medicine

## 2024-06-21 VITALS — BP 100/67 | HR 81 | Temp 97.9°F | Ht 62.0 in | Wt 123.4 lb

## 2024-06-21 DIAGNOSIS — S99911D Unspecified injury of right ankle, subsequent encounter: Secondary | ICD-10-CM | POA: Diagnosis not present

## 2024-06-21 DIAGNOSIS — M818 Other osteoporosis without current pathological fracture: Secondary | ICD-10-CM | POA: Diagnosis not present

## 2024-06-21 NOTE — Progress Notes (Signed)
 Subjective: RR:mphyu ankle pain PCP: Tami Norene HERO, DO YEP:Cprunmpj [name] is a 43 y.o. female presenting to clinic today for:  Right ankle pain  Had a fall 2 weeks ago down some stairs at home.  She reports a popping sensation immediate swelling and bruising.  She was seen at the hospital for x-rays only and that demonstrated no acute fracture.  She was subsequently seen the following morning here by provider and there was concern for ATFL severe sprain.  She was placed in a cam walker and given Tylenol  3.  She has persistent pain and swelling but it is somewhat better than before.  She still cannot bear weight on it.  Medical history is significant for newly diagnosed osteoporosis that is not treated.  She would like to proceed with referral to Dr. Vincente at [facility].  Not using any oral NSAIDs or any topical analgesics.  She is icing the area  ROS: Per HPI  Allergies  Allergen Reactions   Celebrex [Celecoxib]    Latex    Sulfonamide Derivatives    Metformin And Related Nausea Only   Ozempic (0.25 Or 0.5 Mg-Dose) [Semaglutide(0.25 Or 0.5mg -Dos)] Nausea And Vomiting   Victoza [Liraglutide] Nausea And Vomiting   Past Medical History:  Diagnosis Date   Abdominal pain, left lower quadrant 04/09/2010   Qualifier: Diagnosis of   By: Jakie MD [name]        Allergy    Anemia    Anxiety    Clotting disorder    Constipation    Depression    Diabetes mellitus without complication (HCC)    Family history of malignant neoplasm of gastrointestinal tract    Gastroparesis    Hemorrhage of rectum and anus 03/08/2010   Qualifier: Diagnosis of   By: Surface RN, [name]         IBS (irritable bowel syndrome)    constipation   Migraines    Neuromuscular disorder (HCC)    Neuropathy due to type 2 diabetes mellitus (HCC)    Ulcer     Current Outpatient Medications:    Accu-Chek FastClix Lancets MISC, Use to monitor blood glucose 3 time(s) daily, Disp: , Rfl:     ALPRAZolam  (XANAX ) 0.5 MG tablet, TAKE ONE TABLET DAILY AS NEEDED, Disp: 60 tablet, Rfl: 5   atorvastatin  (LIPITOR) 10 MG tablet, Take 1 tablet (10 mg total) by mouth daily., Disp: 90 tablet, Rfl: 3   Blood Glucose Monitoring Suppl (ACCU-CHEK GUIDE) w/Device KIT, 1 each by Does not applyoroute once for 1 dose. Use as directed by physician to monitor blood sugars, Disp: , Rfl:    busPIRone  (BUSPAR ) 15 MG tablet, Take 1 tablet (15 mg total) by mouth 3 (three) times daily. For anxiety, Disp: 270 tablet, Rfl: 3   Continuous Glucose Sensor (FREESTYLE LIBRE 3 PLUS SENSOR) MISC, Monitor sugar continuously, E11.9. Change sensor every 15 days., Disp: 6 each, Rfl: 4   diclofenac Sodium (VOLTAREN) 1 % GEL, APPLY 2 GRAMS TO AFFECTED JOINT(S) FOUR TIMES DAILY, Disp: , Rfl:    escitalopram  (LEXAPRO ) 20 MG tablet, TAKE ONE TABLET BY MOUTH DAILY, Disp: 30 tablet, Rfl: 11   gabapentin  (NEURONTIN ) 300 MG capsule, Take 300 mg by mouth 4 (four) times daily., Disp: , Rfl:    gabapentin  (NEURONTIN ) 600 MG tablet, Take 600 mg by mouth 3 (three) times daily., Disp: , Rfl:    insulin  glargine (LANTUS  SOLOSTAR) 100 UNIT/ML Solostar Pen, Inject 20-25 Units into the skin at bedtime. INJECT 25  UNITS INTO SKIN AT BEDTIME, Disp: 15 mL, Rfl: 4   Insulin  Pen Needle (BD PEN NEEDLE NANO U/F) 32G X 4 MM MISC, Use with insulin   E11.65, Disp: 100 each, Rfl: 3   magnesium oxide (MAG-OX) 400 MG tablet, Take 400 mg by mouth daily., Disp: , Rfl:    montelukast  (SINGULAIR ) 10 MG tablet, Take 1 tablet (10 mg total) by mouth at bedtime. For allergies, Disp: 90 tablet, Rfl: 3   nortriptyline (PAMELOR) 75 MG capsule, Take 75 mg by mouth daily., Disp: , Rfl:    pantoprazole  (PROTONIX ) 40 MG tablet, TAKE ONE TABLET BY MOUTH DAILY, Disp: 30 tablet, Rfl: 11   PAZEO 0.7 % SOLN, Apply 1 drop to eye every morning., Disp: , Rfl:    RESTASIS 0.05 % ophthalmic emulsion, 1 drop 2 (two) times daily., Disp: , Rfl:    sitaGLIPtin  (JANUVIA ) 100 MG tablet, Take  1 tablet (100 mg total) by mouth daily., Disp: 90 tablet, Rfl: 3   terbinafine  (LAMISIL ) 250 MG tablet, Take 1 tablet (250 mg total) by mouth daily., Disp: 90 tablet, Rfl: 0   tiZANidine (ZANAFLEX) 4 MG tablet, Take 4 mg by mouth 2 (two) times daily., Disp: , Rfl:    valACYclovir  (VALTREX ) 1000 MG tablet, TAKE ONE TABLET ONCE DAILY, Disp: 30 tablet, Rfl: 11 Social History   Socioeconomic History   Marital status: Divorced    Spouse name: Not on file   Number of children: 2   Years of education: Not on file   Highest education level: Some college, no degree  Occupational History   Occupation: Comptroller    Comment: part time   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  Tobacco Use   Smoking status: Former    Current packs/day: 0.00    Types: Cigarettes    Start date: 06/24/2004    Quit date: 06/24/2014    Years since quitting: 10.0   Smokeless tobacco: Never  Vaping Use   Vaping status: Never Used  Substance and Sexual Activity   Alcohol use: Yes    Comment: once in a while   Drug use: No   Sexual activity: Not Currently    Birth control/protection: Surgical    Comment: hyst  Other Topics Concern   Not on file  Social History Narrative   Not on file   Social Drivers of Health   Financial Resource Strain: Patient Declined (05/28/2024)   Received from [facility]   Overall Financial Resource Strain (CARDIA)    How hard is it for you to pay for the very basics like food, housing, medical care, and heating?: Patient declined  Food Insecurity: Patient Declined (05/28/2024)   Received from [facility]   Hunger Vital Sign    Within the past 12 months, you worried that your food would run out before you got the money to buy more.: Patient declined    Within the past 12 months, the food you bought just didn't last and you didn't have money to get more.: Patient declined  Transportation Needs: Patient Declined (05/28/2024)   Received from [facility] - Transportation    In the past 12 months, has lack of transportation kept you from medical appointments or from getting medications?: Patient declined    In the past 12 months, has lack of transportation kept you from meetings, work, or from getting things needed for daily living?: Patient declined  Physical Activity: Unknown (05/28/2024)   Received  from [facility]   Exercise Vital Sign    On average, how many days per week do you engage in moderate to strenuous exercise (like a brisk walk)?: Patient declined    Minutes of Exercise per Session: Not on file  Stress: Patient Declined (05/28/2024)   Received from [facility] - Occupational Stress Questionnaire    Do you feel stress - tense, restless, nervous, or anxious, or unable to sleep at night because your mind is troubled all the time - these days?: Patient declined  Social Connections: Socially Integrated (05/28/2024)   Received from [facility]   Social Network    How would you rate your social network (family, work, friends)?: Good participation with social networks  Intimate Partner Violence: Not At Risk (05/28/2024)   Received from [facility]   HITS    Over the last 12 months how often did your partner physically hurt you?: Never    Over the last 12 months how often did your partner insult you or talk down to you?: Never    Over the last 12 months how often did your partner threaten you with physical harm?: Never    Over the last 12 months how often did your partner scream or curse at you?: Never   Family History  Problem Relation Age of Onset   Other Paternal Grandfather        fell down stairs   Other Paternal Grandmother        fell down stairs   Alzheimer's disease Maternal Grandmother    Dementia Maternal Grandmother    Colon cancer Maternal Grandfather    Varicose Veins Father    Other Mother        back pain; nerve damage   Hypertension Mother    Arthritis  Mother    Varicose Veins Mother    Hemophilia Son     Objective: Office vital signs reviewed. BP 100/67   Pulse 81   Temp 97.9 F (36.6 C)   Ht 5' 2 (1.575 m)   Wt 123 lb 6 oz (56 kg)   LMP 06/12/2016   SpO2 97%   BMI 22.57 kg/m   Physical Examination:  General: Awake, alert, well nourished, No acute distress MSK: Persistent ecchymosis along the lateral aspect of the foot with tenderness palpation over the fifth MTP and along the ATFL.  No tenderness palpation along the distal lateral malleolus.  She is unable to bear weight on the foot  Recent Results (from the past 2160 hours)  Bayer DCA Hb A1c Waived     Status: Abnormal   Collection Time: 04/05/24  8:18 AM  Result Value Ref Range   HB A1C (BAYER DCA - WAIVED) 6.0 (H) 4.8 - 5.6 %    Comment:          Prediabetes: 5.7 - 6.4          Diabetes: >6.4          Glycemic control for adults with diabetes: <7.0   PTH, Intact and Calcium      Status: None   Collection Time: 05/21/24 10:47 AM  Result Value Ref Range   Calcium  9.3 8.7 - 10.2 mg/dL   PTH 25 15 - 65 pg/mL   PTH Interp Comment     Comment: Interpretation                 Intact PTH    Calcium                                  (  pg/mL)      (mg/dL) Normal                          15 - 65     8.6 - 10.2 Primary Hyperparathyroidism         >65          >10.2 Secondary Hyperparathyroidism       >65          <10.2 Non-Parathyroid Hypercalcemia       <65          >10.2 Hypoparathyroidism                  <15          < 8.6 Non-Parathyroid Hypocalcemia    15 - 65          < 8.6   VITAMIN D  25 Hydroxy (Vit-D Deficiency, Fractures)     Status: None   Collection Time: 05/21/24 10:47 AM  Result Value Ref Range   Vit D, 25-Hydroxy 31.2 30.0 - 100.0 ng/mL    Comment: Vitamin D  deficiency has been defined by the Institute of Medicine and an Endocrine Society practice guideline as a level of serum 25-OH vitamin D  less than 20 ng/mL (1,2). The Endocrine Society went on to  further define vitamin D  insufficiency as a level between 21 and 29 ng/mL (2). 1. IOM (Institute of Medicine). 2010. Dietary reference    intakes for calcium  and D. [place]: The    [employer]. 2. Holick MF, Binkley , Bischoff-Ferrari HA, et al.    Evaluation, treatment, and prevention of vitamin D     deficiency: an Endocrine Society clinical practice    guideline. JCEM. 2011 Jul; 96(7):1911-30.   Hepatic function panel     Status: None   Collection Time: 05/21/24 10:47 AM  Result Value Ref Range   Total Protein 6.4 6.0 - 8.5 g/dL   Albumin 4.0 3.9 - 4.9 g/dL   Bilirubin Total 0.5 0.0 - 1.2 mg/dL   Bilirubin, Direct 9.82 0.00 - 0.40 mg/dL   Alkaline Phosphatase 106 44 - 121 IU/L    Comment: **Effective June 03, 2024 Alkaline Phosphatase**   reference interval will be changing to:              Age                Female          Female           0 -  5 days         47 - 127       47 - 127           6 - 10 days         29 - 242       29 - 242          11 - 20 days        109 - 357      109 - 357          21 - 30 days         94 - 494       94 - 494           1 -  2 months      149 - 539      149 - 539  3 -  6 months      131 - 452      131 - 452           7 - 11 months      117 - 401      117 - 401   12 months -  6 years       158 - 369      158 - 369           7 - 12 years       150 - 409      150 - 409               13 years       156 - 435       78 - 227               14 years       114 - 375       64 - 161               15 years        88 - 279       56 - 134               16 years        74 - 207       51 - 121               17 years        63 - 161       47 - 113          18 - 20 years        51 - 125       42 - 106          21 - 50 years         47 - 123       41 - 116          51 - 80 years        49 - 135       51 - 125              >80 years        48 - 129       48 - 129    AST 20 0 - 40 IU/L   ALT 19 0 - 32 IU/L   Assessment/  Plan: 43 y.o. female   Injury of right ankle, subsequent encounter - Plan: DG Ankle Complete Right, Ambulatory referral to Orthopedic Surgery  Other osteoporosis without current pathological fracture - Plan: Ambulatory referral to Orthopedic Surgery   ?  Severe sprain versus tear of ATFL versus avulsion fracture given persistent bruising and swelling.  Continue CAM Walker.  Repeat plain films.  Stat referral to orthopedics placed.  We discussed DEXA results and indication for treatment.  She has history of postsurgical menopause back in 2017.  Would like to get vitamin D  testing done today and then she will look into both Prolia and Fosamax and message me with what she selects   Norene CHRISTELLA Fielding, DO [facility] [phone]

## 2024-06-27 ENCOUNTER — Other Ambulatory Visit: Payer: Self-pay | Admitting: Family Medicine

## 2024-06-27 DIAGNOSIS — M25571 Pain in right ankle and joints of right foot: Secondary | ICD-10-CM | POA: Diagnosis not present

## 2024-07-02 ENCOUNTER — Ambulatory Visit
Admission: RE | Admit: 2024-07-02 | Discharge: 2024-07-02 | Disposition: A | Discharge status: Home / Self Care | Source: Ambulatory Visit | Attending: Family Medicine | Admitting: Family Medicine

## 2024-07-02 DIAGNOSIS — M25571 Pain in right ankle and joints of right foot: Secondary | ICD-10-CM

## 2024-07-05 ENCOUNTER — Other Ambulatory Visit: Payer: Self-pay | Admitting: Family Medicine

## 2024-07-05 DIAGNOSIS — M25571 Pain in right ankle and joints of right foot: Secondary | ICD-10-CM | POA: Diagnosis not present

## 2024-07-15 ENCOUNTER — Encounter: Payer: Self-pay | Admitting: Family Medicine

## 2024-07-18 ENCOUNTER — Ambulatory Visit
Admission: RE | Admit: 2024-07-18 | Discharge: 2024-07-18 | Disposition: A | Discharge status: Home / Self Care | Source: Ambulatory Visit | Attending: Family Medicine | Admitting: Family Medicine

## 2024-07-18 DIAGNOSIS — M25571 Pain in right ankle and joints of right foot: Secondary | ICD-10-CM

## 2024-07-18 DIAGNOSIS — S92211A Displaced fracture of cuboid bone of right foot, initial encounter for closed fracture: Secondary | ICD-10-CM | POA: Diagnosis not present

## 2024-08-02 DIAGNOSIS — M25571 Pain in right ankle and joints of right foot: Secondary | ICD-10-CM | POA: Diagnosis not present

## 2024-08-02 DIAGNOSIS — S92214D Nondisplaced fracture of cuboid bone of right foot, subsequent encounter for fracture with routine healing: Secondary | ICD-10-CM | POA: Diagnosis not present

## 2024-08-12 ENCOUNTER — Ambulatory Visit: Attending: Family Medicine | Admitting: Physical Therapy

## 2024-08-12 ENCOUNTER — Other Ambulatory Visit: Payer: Self-pay

## 2024-08-12 DIAGNOSIS — M25671 Stiffness of right ankle, not elsewhere classified: Secondary | ICD-10-CM | POA: Diagnosis not present

## 2024-08-12 DIAGNOSIS — R2689 Other abnormalities of gait and mobility: Secondary | ICD-10-CM | POA: Insufficient documentation

## 2024-08-12 DIAGNOSIS — M6281 Muscle weakness (generalized): Secondary | ICD-10-CM | POA: Diagnosis not present

## 2024-08-12 DIAGNOSIS — M25571 Pain in right ankle and joints of right foot: Secondary | ICD-10-CM | POA: Diagnosis not present

## 2024-08-12 NOTE — Addendum Note (Signed)
 Addended by: JENNETTE CHRISTENE EARING MARIE L on: 08/12/2024 02:51 PM   Modules accepted: Orders

## 2024-08-12 NOTE — Therapy (Addendum)
 OUTPATIENT PHYSICAL THERAPY LOWER EXTREMITY EVALUATION   Patient Name: [name] MRN: [id number] DOB:[birth year], 43 y.o., female Today's Date: 08/12/2024  END OF SESSION:  PT End of Session - 08/12/24 0853     Visit Number 1    Number of Visits 16    Date for Recertification  10/07/24    Authorization Type Medicaid    PT Start Time 0850    PT Stop Time 0925    PT Time Calculation (min) 35 min          Past Medical History:  Diagnosis Date   Abdominal pain, left lower quadrant 04/09/2010   Qualifier: Diagnosis of   By: Jakie MD [name]        Allergy    Anemia    Anxiety    Clotting disorder    Constipation    Depression    Diabetes mellitus without complication (HCC)    Family history of malignant neoplasm of gastrointestinal tract    Gastroparesis    Hemorrhage of rectum and anus 03/08/2010   Qualifier: Diagnosis of   By: Surface RN, Arland         IBS (irritable bowel syndrome)    constipation   Migraines    Neuromuscular disorder (HCC)    Neuropathy due to type 2 diabetes mellitus (HCC)    Ulcer    Past Surgical History:  Procedure Laterality Date   ABDOMINAL HYSTERECTOMY N/A 06/29/2016   Procedure: TOTAL ABDOMINAL HYSTERECTOMY;  Surgeon: Vonn VEAR Inch, MD;  Location: [facility];  Service: Gynecology;  Laterality: N/A;   BREAST BIOPSY     CESAREAN SECTION     CHOLECYSTECTOMY  09/19/2005   EYE SURGERY  09/20/1987   SALPINGOOPHORECTOMY Bilateral 06/29/2016   Procedure: BILATERAL SALPINGO OOPHORECTOMY;  Surgeon: Vonn VEAR Inch, MD;  Location: [facility];  Service: Gynecology;  Laterality: Bilateral;   TONSILLECTOMY  09/19/1990   Patient Active Problem List   Diagnosis Date Noted   Other osteoporosis without current pathological fracture 04/19/2024   Hyperlipidemia associated with type 2 diabetes mellitus (HCC) 05/07/2021   Small fiber neuropathy 04/24/2020   Migraine without aura and without status migrainosus, not intractable 04/24/2020   Family  history of hemophilia A 01/18/2017   Hx of total hysterectomy 06/29/2016   Insulin -requiring or dependent type II diabetes mellitus (HCC) 06/19/2013   GERD 05/07/2010   DEPRESSION 03/08/2010   CONSTIPATION 03/08/2010    PCP: Jolinda Norene HERO, DO  REFERRING PROVIDER: Delane Lye, MD  REFERRING DIAG: S92.214D: Nondisplaced fracture of cuboid bone of right foot, subsequent encounter  THERAPY DIAG:  Pain in right ankle and joints of right foot  Stiffness of right ankle, not elsewhere classified  Muscle weakness (generalized)  Other abnormalities of gait and mobility  Rationale for Evaluation and Treatment: Rehabilitation  ONSET DATE: 06/03/24  SUBJECTIVE:   SUBJECTIVE STATEMENT: Pt states she fell down steps to get laundry and heard something pop in her R foot/ankle. Got an x-ray and initially saw only a sprain but she couldn't bear any weight on it. Eventually saw orthopedic and was sent for CT and saw a bone fracture. Got an MRI to insure tendons remain intact and they saw a few more fractures. Will be in boot for another 4 weeks but can get out of her boot for up to an hour at a time and allowed to drive 10 min at time.   PERTINENT HISTORY: R rotator cuff injury  PAIN:  Are you having pain? Yes:  NPRS scale: 5-6 currently, at worst >10 Pain location: R lateral foot Pain description: aching, thorobbing  Aggravating factors: When out of the boot Relieving factors: icing, voltaren gel and biofreeze, tylenol    PRECAUTIONS: None Latex and bee sting allergy  RED FLAGS: None   WEIGHT BEARING RESTRICTIONS: No  FALLS:  Has patient fallen in last 6 months? Yes. 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  DIAGNOSTIC FINDINGS:  07/02/24 CT IMPRESSION: 1. Mildly displaced intra-articular fracture of the cuboid. 2. No other acute osseous findings. 3. Mild soft tissue swelling laterally in the midfoot.  07/18/24 MRI IMPRESSION: 1. No acute or significant findings demonstrated at the ankle or hindfoot. 2. Fracture of the distal cuboid with associated bone marrow edema, correlating with recent CT findings. 3. Bone marrow edema in the medial cuneiform and bases of the first through third metatarsals, consistent with nondisplaced fractures or bone contusions. 4. Mild midfoot soft tissue swelling without focal fluid collection.   PATIENT SURVEYS:  LEFS  Extreme difficulty/unable (0), Quite a bit of difficulty (1), Moderate difficulty (2), Little difficulty (3), No difficulty (4) Survey date:  08/12/24  Any of your usual work, housework or school activities 0  2. Usual hobbies, recreational or sporting activities 0  3. Getting into/out of the bath 2  4. Walking between rooms 2  5. Putting on socks/shoes 2  6. Squatting  3  7. Lifting an object, like a bag of groceries from the floor 4  8. Performing light activities around your home 4  9. Performing heavy activities around your home 0  10. Getting into/out of a car 3  11. Walking 2 blocks 0  12. Walking 1 mile 0  13. Going up/down 10 stairs (1 flight) 1  14. Standing for 1 hour 1  15.  sitting for 1 hour 4  16. Running on even ground 0  17. Running on uneven ground 0  18. Making sharp turns while running fast 0  19. Hopping  0  20. Rolling over in bed 4  Score total:  26     COGNITION: Overall cognitive status: Within functional limits for tasks  assessed     SENSATION: WFL  EDEMA:  Mild  POSTURE: No Significant postural limitations  PALPATION: TTP along R ant tib and toe extensor tendons; slightly in R peroneal muscle belly but especially along peroneal tendon insertion  Noted atrophy in R calf vs L calf  LOWER EXTREMITY ROM:  Active ROM Right eval Left eval  Hip flexion    Hip extension    Hip abduction    Hip adduction    Hip internal rotation    Hip external rotation    Knee flexion    Knee extension    Ankle dorsiflexion -5 18  Ankle plantarflexion 10 22  Ankle inversion 25 24  Ankle eversion 20 30   (Blank rows = not tested)  LOWER EXTREMITY MMT:  MMT Right eval Left eval  Hip flexion  5 5  Hip extension    Hip abduction 4 4  Hip adduction    Hip internal rotation    Hip external rotation    Knee flexion 5 5  Knee extension 5 5  Ankle dorsiflexion 3+ 5  Ankle plantarflexion    Ankle inversion 3+ 5  Ankle eversion 3+ 5   (Blank rows = not tested)  LOWER EXTREMITY SPECIAL TESTS:  Did not assess  FUNCTIONAL TESTS:  5 times sit to stand: unable to bear weight without boot SLS: unable to bear weight  GAIT: Distance walked: Into clinic Assistive device utilized: cam boot Level of assistance: Complete Independence Comments: Reciprocal pattern                                                                                                                                TREATMENT DATE: 08/12/24 To be initiated next session    PATIENT EDUCATION:  Education details: Exam findings, POC Person educated: Patient Education method: Explanation Education comprehension: verbalized understanding and needs further education  HOME EXERCISE PROGRAM: To be initiated  ASSESSMENT:  CLINICAL IMPRESSION: Patient is a 43 y.o. F who was seen today for physical therapy evaluation and treatment for R foot/ankle pain s/p R foot fractures. No surgery performed. Pt to be in cam boot for 4 more weeks but  cleared to be out of boot for 1 hour. Pt states ortho did not give her a weight bearing status. Assessment is significant for decreased talocrural and subtalar joint mobility, weakness throughout the ankle and pain inhibiting. Atrophy is noted of her R calf vs L. Pt will benefit from PT to address these issues and return to PLOF.   OBJECTIVE IMPAIRMENTS: Abnormal gait, decreased activity tolerance, decreased balance, decreased coordination, decreased endurance, decreased mobility, difficulty walking, decreased ROM, decreased strength, hypomobility, increased edema, increased fascial restrictions, increased muscle spasms, impaired flexibility, impaired UE functional use, improper body mechanics, postural dysfunction, and pain.   ACTIVITY LIMITATIONS: carrying, lifting, standing, squatting, stairs, transfers, hygiene/grooming, and locomotion level  PARTICIPATION LIMITATIONS: meal prep, cleaning, laundry, shopping, community activity, and yard work  PERSONAL FACTORS: Age, Fitness, Past/current experiences, and Time since onset of injury/illness/exacerbation are also affecting patient's functional outcome.   REHAB POTENTIAL: Good  CLINICAL DECISION MAKING: Evolving/moderate complexity  EVALUATION COMPLEXITY: Moderate   GOALS: Goals reviewed with patient? Yes  SHORT TERM GOALS: Target date: 09/09/2024  Pt will be ind with initial HEP Baseline: Goal status: INITIAL  2.  Pt will demo at least neutral R ankle DF Baseline:  Goal status: INITIAL   LONG TERM GOALS: Target date: 10/07/2024   Pt will be ind with management and progression of HEP Baseline:  Goal status: INITIAL  2.  Pt will be able to amb in the house with a normal reciprocal pattern without cam boot Baseline:  Goal status: INITIAL  3.  Pt will report improved pain by >/=50% Baseline:  Goal status: INITIAL  4.  Pt will be able to tolerate single leg stance on R LE for at least 10 sec Baseline:  Goal status:  INITIAL  5.  Pt will have L = R ankle DF for stairs and transfers Baseline:  Goal status: INITIAL   PLAN:  PT FREQUENCY: 2x/week  PT DURATION: 8 weeks  PLANNED INTERVENTIONS: 97164- PT Re-evaluation, 97750- Physical Performance Testing, 97110-Therapeutic exercises, 97530- Therapeutic activity, [id number]- Neuromuscular re-education, 97535- Self Care, [id number]- Manual therapy, [id number]- Gait training, [phone]- Orthotic Initial, [phone]- Orthotic/Prosthetic subsequent, [phone] (1-2 muscles), 20561 (3+ muscles)- Dry Needling, Patient/Family education, Balance training, Stair training, Taping, Joint mobilization, Spinal mobilization, Cryotherapy, and Moist heat  No vaso, e-stim, US , traction, ionto per healthy blue medicaid  PLAN FOR NEXT SESSION: Initiate gentle subtalar/talocrural joint mobilization. Ankle mobility into DF. Initiate isometric to eccentric strengthening of ankle.    Gahel Safley April Ma L Dontre Laduca, PT, DPT 08/12/2024, 2:49 PM

## 2024-08-20 ENCOUNTER — Ambulatory Visit: Admitting: Physical Therapy

## 2024-08-20 ENCOUNTER — Other Ambulatory Visit: Payer: Self-pay | Admitting: Family Medicine

## 2024-08-20 ENCOUNTER — Encounter: Payer: Self-pay | Admitting: Physical Therapy

## 2024-08-20 ENCOUNTER — Other Ambulatory Visit: Payer: Self-pay | Admitting: Obstetrics & Gynecology

## 2024-08-20 DIAGNOSIS — M25571 Pain in right ankle and joints of right foot: Secondary | ICD-10-CM | POA: Diagnosis present

## 2024-08-20 DIAGNOSIS — R2689 Other abnormalities of gait and mobility: Secondary | ICD-10-CM | POA: Diagnosis present

## 2024-08-20 DIAGNOSIS — M6281 Muscle weakness (generalized): Secondary | ICD-10-CM | POA: Insufficient documentation

## 2024-08-20 DIAGNOSIS — J3089 Other allergic rhinitis: Secondary | ICD-10-CM

## 2024-08-20 DIAGNOSIS — M25671 Stiffness of right ankle, not elsewhere classified: Secondary | ICD-10-CM | POA: Diagnosis present

## 2024-08-20 NOTE — Therapy (Signed)
 OUTPATIENT PHYSICAL THERAPY TREATMENT   Patient Name: [name] MRN: [id number] DOB:28-Jan-1981, 43 y.o., female Today's Date: 08/20/2024  END OF SESSION:  PT End of Session - 08/20/24 0935     Visit Number 2    Number of Visits 16    Date for Recertification  10/07/24    Authorization Type Medicaid    PT Start Time 0930    PT Stop Time 1010    PT Time Calculation (min) 40 min           Past Medical History:  Diagnosis Date   Abdominal pain, left lower quadrant 04/09/2010   Qualifier: Diagnosis of   By: Jakie MD [name]        Allergy    Anemia    Anxiety    Clotting disorder    Constipation    Depression    Diabetes mellitus without complication (HCC)    Family history of malignant neoplasm of gastrointestinal tract    Gastroparesis    Hemorrhage of rectum and anus 03/08/2010   Qualifier: Diagnosis of   By: Surface RN, Arland         IBS (irritable bowel syndrome)    constipation   Migraines    Neuromuscular disorder (HCC)    Neuropathy due to type 2 diabetes mellitus (HCC)    Ulcer    Past Surgical History:  Procedure Laterality Date   ABDOMINAL HYSTERECTOMY N/A 06/29/2016   Procedure: TOTAL ABDOMINAL HYSTERECTOMY;  Surgeon: Vonn VEAR Inch, MD;  Location: [facility];  Service: Gynecology;  Laterality: N/A;   BREAST BIOPSY     CESAREAN SECTION     CHOLECYSTECTOMY  09/19/2005   EYE SURGERY  09/20/1987   SALPINGOOPHORECTOMY Bilateral 06/29/2016   Procedure: BILATERAL SALPINGO OOPHORECTOMY;  Surgeon: Vonn VEAR Inch, MD;  Location: [facility];  Service: Gynecology;  Laterality: Bilateral;   TONSILLECTOMY  09/19/1990   Patient Active Problem List   Diagnosis Date Noted   Other osteoporosis without current pathological fracture 04/19/2024   Hyperlipidemia associated with type 2 diabetes mellitus (HCC) 05/07/2021   Small fiber neuropathy 04/24/2020   Migraine without aura and without status migrainosus, not intractable 04/24/2020   Family history of  hemophilia A 01/18/2017   Hx of total hysterectomy 06/29/2016   Insulin -requiring or dependent type II diabetes mellitus (HCC) 06/19/2013   GERD 05/07/2010   DEPRESSION 03/08/2010   CONSTIPATION 03/08/2010    PCP: Jolinda Norene HERO, DO  REFERRING PROVIDER: Delane Lye, MD  REFERRING DIAG: S92.214D: Nondisplaced fracture of cuboid bone of right foot, subsequent encounter  THERAPY DIAG:  Pain in right ankle and joints of right foot  Stiffness of right ankle, not elsewhere classified  Muscle weakness (generalized)  Other abnormalities of gait and mobility  Rationale for Evaluation and Treatment: Rehabilitation  ONSET DATE: 06/03/24  SUBJECTIVE:   SUBJECTIVE STATEMENT: Pt states she has more stiffness than pain.   PERTINENT HISTORY: R rotator cuff injury  PAIN:  Are you having pain? Yes: NPRS scale: 2 currently, at worst >10 Pain location: R lateral foot Pain description: aching, thorobbing  Aggravating factors: When out of the boot Relieving factors: icing, voltaren gel and biofreeze, tylenol    PRECAUTIONS: None Latex and bee sting allergy  RED FLAGS: None   WEIGHT BEARING RESTRICTIONS: No  FALLS:  Has patient fallen in last 6 months? Yes. 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  DIAGNOSTIC FINDINGS:  07/02/24 CT IMPRESSION: 1. Mildly displaced intra-articular fracture of the cuboid. 2. No other acute osseous findings. 3. Mild soft tissue  swelling laterally in the midfoot.  07/18/24 MRI IMPRESSION: 1. No acute or significant findings demonstrated at the ankle or hindfoot. 2. Fracture of the distal cuboid with associated bone marrow edema, correlating with recent CT findings. 3. Bone marrow edema in the medial cuneiform and bases of the first through third metatarsals, consistent with nondisplaced fractures or bone contusions. 4. Mild midfoot soft tissue swelling without focal fluid collection.   PATIENT SURVEYS:  LEFS  Extreme difficulty/unable (0), Quite a bit of difficulty (1), Moderate difficulty (2), Little difficulty (3), No difficulty (4) Survey date:  08/12/24  Any of your usual work, housework or school activities 0  2. Usual hobbies, recreational or sporting activities 0  3. Getting into/out of the bath 2  4. Walking between rooms 2  5. Putting on socks/shoes 2  6. Squatting  3  7. Lifting an object, like a bag of groceries from the floor 4  8. Performing light activities around your home 4  9. Performing heavy activities around your home 0  10. Getting into/out of a car 3  11. Walking 2 blocks 0  12. Walking 1 mile 0  13. Going up/down 10 stairs (1 flight) 1  14. Standing for 1 hour 1  15.  sitting for 1 hour 4  16. Running on even ground 0  17. Running on uneven ground 0  18. Making sharp turns while running fast 0  19. Hopping  0  20. Rolling over in bed 4  Score total:  26     COGNITION: Overall cognitive status: Within functional limits for tasks assessed     SENSATION: WFL  EDEMA:  Mild  POSTURE: No Significant postural limitations  PALPATION: TTP along R ant tib and toe extensor tendons; slightly in R peroneal muscle belly but especially along peroneal tendon insertion  Noted atrophy in R calf vs L calf  LOWER EXTREMITY ROM:  Active ROM Right eval Left eval  Hip flexion    Hip extension    Hip abduction    Hip adduction    Hip internal rotation    Hip external rotation     Knee flexion    Knee extension    Ankle dorsiflexion -5 18  Ankle plantarflexion 10 22  Ankle inversion 25 24  Ankle eversion 20 30   (Blank rows = not tested)  LOWER EXTREMITY MMT:  MMT Right eval Left eval  Hip flexion 5 5  Hip extension    Hip abduction 4 4  Hip adduction    Hip internal rotation    Hip external rotation    Knee flexion 5 5  Knee extension 5 5  Ankle dorsiflexion 3+ 5  Ankle plantarflexion    Ankle inversion 3+ 5  Ankle eversion 3+ 5   (Blank rows = not tested)  LOWER EXTREMITY SPECIAL TESTS:  Did not assess  FUNCTIONAL TESTS:  5 times sit to stand: unable to  bear weight without boot SLS: unable to bear weight  GAIT: Distance walked: Into clinic Assistive device utilized: cam boot Level of assistance: Complete Independence Comments: Reciprocal pattern                                                                                                                                TREATMENT DATE:  08/20/24 Nustep L2 LEs only x10 min Supine gastroc stretch with strap 2x30 Supine soleus stretch with strap 2x30 Supine ankle circles x10 CW & CCW Manual therapy: Grade II to III talar PA, gentle talocrural distraction Supine ankle PF red TB 3x10 Supine ankle DF red TB 3x10 Supine ankle inversion iso against ball 2x10 Supine SLR 2x10 Standing alternating heel raise 3x10  08/12/24 To be initiated next session    PATIENT EDUCATION:  Education details: Exam findings, POC Person educated: Patient Education method: Explanation Education comprehension: verbalized understanding and needs further education  HOME EXERCISE PROGRAM: To be initiated  ASSESSMENT:  CLINICAL IMPRESSION: Treatment focused on improving ankle mobility and initiating strengthening to address ankle/calf atrophy. Pt has been feeling a lot of gastroc/soleus tightness -- provided HEP to address this accordingly.  OBJECTIVE IMPAIRMENTS: Abnormal gait, decreased activity  tolerance, decreased balance, decreased coordination, decreased endurance, decreased mobility, difficulty walking, decreased ROM, decreased strength, hypomobility, increased edema, increased fascial restrictions, increased muscle spasms, impaired flexibility, impaired UE functional use, improper body mechanics, postural dysfunction, and pain.   ACTIVITY LIMITATIONS: carrying, lifting, standing, squatting, stairs, transfers, hygiene/grooming, and locomotion level  PARTICIPATION LIMITATIONS: meal prep, cleaning, laundry, shopping, community activity, and yard work  PERSONAL FACTORS: Age, Fitness, Past/current experiences, and Time since onset of injury/illness/exacerbation are also affecting patient's functional outcome.   REHAB POTENTIAL: Good  CLINICAL DECISION MAKING: Evolving/moderate complexity  EVALUATION COMPLEXITY: Moderate   GOALS: Goals reviewed with patient? Yes  SHORT TERM GOALS: Target date: 09/09/2024  Pt will be ind with initial HEP Baseline: Goal status: INITIAL  2.  Pt will demo at least neutral R ankle DF Baseline:  Goal status: INITIAL   LONG TERM GOALS: Target date: 10/07/2024   Pt will be ind with management and progression of HEP Baseline:  Goal status: INITIAL  2.  Pt will be able to amb in the house with a normal reciprocal pattern without cam boot Baseline:  Goal status: INITIAL  3.  Pt will report improved pain by >/=50% Baseline:  Goal status: INITIAL  4.  Pt will be able to tolerate single leg stance on R LE for at least 10 sec Baseline:  Goal status: INITIAL  5.  Pt will have L = R ankle DF for stairs and transfers Baseline:  Goal status: INITIAL   PLAN:  PT FREQUENCY: 2x/week  PT DURATION: 8 weeks  PLANNED INTERVENTIONS: 97164- PT Re-evaluation, 97750- Physical Performance Testing, 97110-Therapeutic exercises, 97530- Therapeutic activity, [id number]- Neuromuscular re-education, 97535- Self Care, 02859- Manual therapy, [id number]- Gait  training, [id number]- Orthotic Initial, [id number]- Orthotic/Prosthetic  subsequent, 20560 (1-2 muscles), 20561 (3+ muscles)- Dry Needling, Patient/Family education, Balance training, Stair training, Taping, Joint mobilization, Spinal mobilization, Cryotherapy, and Moist heat  No vaso, e-stim, US , traction, ionto per healthy blue medicaid  PLAN FOR NEXT SESSION: Initiate gentle subtalar/talocrural joint mobilization. Ankle mobility into DF. Ankle strengthening.   Foy Vanduyne April Ma L Darryn Kydd, PT, DPT 08/20/2024, 9:35 AM

## 2024-08-22 ENCOUNTER — Ambulatory Visit

## 2024-08-22 DIAGNOSIS — R2689 Other abnormalities of gait and mobility: Secondary | ICD-10-CM

## 2024-08-22 DIAGNOSIS — M25671 Stiffness of right ankle, not elsewhere classified: Secondary | ICD-10-CM

## 2024-08-22 DIAGNOSIS — M25571 Pain in right ankle and joints of right foot: Secondary | ICD-10-CM | POA: Diagnosis not present

## 2024-08-22 DIAGNOSIS — M6281 Muscle weakness (generalized): Secondary | ICD-10-CM

## 2024-08-22 NOTE — Therapy (Signed)
 OUTPATIENT PHYSICAL THERAPY TREATMENT   Patient Name: [name] MRN: [id number] DOB:[birth year], 43 y.o., female Today's Date: 08/22/2024  END OF SESSION:  PT End of Session - 08/22/24 0931     Visit Number 3    Number of Visits 16    Date for Recertification  10/07/24    Authorization Type Medicaid    PT Start Time 0931    PT Stop Time 1015    PT Time Calculation (min) 44 min           Past Medical History:  Diagnosis Date   Abdominal pain, left lower quadrant 04/09/2010   Qualifier: Diagnosis of   By: Jakie MD [name]        Allergy    Anemia    Anxiety    Clotting disorder    Constipation    Depression    Diabetes mellitus without complication (HCC)    Family history of malignant neoplasm of gastrointestinal tract    Gastroparesis    Hemorrhage of rectum and anus 03/08/2010   Qualifier: Diagnosis of   By: Surface RN, Arland         IBS (irritable bowel syndrome)    constipation   Migraines    Neuromuscular disorder (HCC)    Neuropathy due to type 2 diabetes mellitus (HCC)    Ulcer    Past Surgical History:  Procedure Laterality Date   ABDOMINAL HYSTERECTOMY N/A 06/29/2016   Procedure: TOTAL ABDOMINAL HYSTERECTOMY;  Surgeon: Vonn VEAR Inch, MD;  Location: [facility];  Service: Gynecology;  Laterality: N/A;   BREAST BIOPSY     CESAREAN SECTION     CHOLECYSTECTOMY  09/19/2005   EYE SURGERY  09/20/1987   SALPINGOOPHORECTOMY Bilateral 06/29/2016   Procedure: BILATERAL SALPINGO OOPHORECTOMY;  Surgeon: Vonn VEAR Inch, MD;  Location: [facility];  Service: Gynecology;  Laterality: Bilateral;   TONSILLECTOMY  09/19/1990   Patient Active Problem List   Diagnosis Date Noted   Other osteoporosis without current pathological fracture 04/19/2024   Hyperlipidemia associated with type 2 diabetes mellitus (HCC) 05/07/2021   Small fiber neuropathy 04/24/2020   Migraine without aura and without status migrainosus, not intractable 04/24/2020   Family history of  hemophilia A 01/18/2017   Hx of total hysterectomy 06/29/2016   Insulin -requiring or dependent type II diabetes mellitus (HCC) 06/19/2013   GERD 05/07/2010   DEPRESSION 03/08/2010   CONSTIPATION 03/08/2010    PCP: Jolinda Norene HERO, DO  REFERRING PROVIDER: Delane Lye, MD  REFERRING DIAG: S92.214D: Nondisplaced fracture of cuboid bone of right foot, subsequent encounter  THERAPY DIAG:  Pain in right ankle and joints of right foot  Stiffness of right ankle, not elsewhere classified  Muscle weakness (generalized)  Other abnormalities of gait and mobility  Rationale for Evaluation and Treatment: Rehabilitation  ONSET DATE: 06/03/24  SUBJECTIVE:   SUBJECTIVE STATEMENT: Pt states she has more stiffness than pain.   PERTINENT HISTORY: R rotator cuff injury  PAIN:  Are you having pain? Yes: NPRS scale: 2 currently, at worst >10 Pain location: R lateral foot Pain description: aching, thorobbing  Aggravating factors: When out of the boot Relieving factors: icing, voltaren gel and biofreeze, tylenol    PRECAUTIONS: None Latex and bee sting allergy  RED FLAGS: None   WEIGHT BEARING RESTRICTIONS: No  FALLS:  Has patient fallen in last 6 months? Yes. 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  DIAGNOSTIC FINDINGS:  07/02/24 CT IMPRESSION: 1. Mildly displaced intra-articular fracture of the cuboid. 2. No other acute osseous findings. 3. Mild soft tissue  swelling laterally in the midfoot.  07/18/24 MRI IMPRESSION: 1. No acute or significant findings demonstrated at the ankle or hindfoot. 2. Fracture of the distal cuboid with associated bone marrow edema, correlating with recent CT findings. 3. Bone marrow edema in the medial cuneiform and bases of the first through third metatarsals, consistent with nondisplaced fractures or bone contusions. 4. Mild midfoot soft tissue swelling without focal fluid collection.   PATIENT SURVEYS:  LEFS  Extreme difficulty/unable (0), Quite a bit of difficulty (1), Moderate difficulty (2), Little difficulty (3), No difficulty (4) Survey date:  08/12/24  Any of your usual work, housework or school activities 0  2. Usual hobbies, recreational or sporting activities 0  3. Getting into/out of the bath 2  4. Walking between rooms 2  5. Putting on socks/shoes 2  6. Squatting  3  7. Lifting an object, like a bag of groceries from the floor 4  8. Performing light activities around your home 4  9. Performing heavy activities around your home 0  10. Getting into/out of a car 3  11. Walking 2 blocks 0  12. Walking 1 mile 0  13. Going up/down 10 stairs (1 flight) 1  14. Standing for 1 hour 1  15.  sitting for 1 hour 4  16. Running on even ground 0  17. Running on uneven ground 0  18. Making sharp turns while running fast 0  19. Hopping  0  20. Rolling over in bed 4  Score total:  26     COGNITION: Overall cognitive status: Within functional limits for tasks assessed     SENSATION: WFL  EDEMA:  Mild  POSTURE: No Significant postural limitations  PALPATION: TTP along R ant tib and toe extensor tendons; slightly in R peroneal muscle belly but especially along peroneal tendon insertion  Noted atrophy in R calf vs L calf  LOWER EXTREMITY ROM:  Active ROM Right eval Left eval  Hip flexion    Hip extension    Hip abduction    Hip adduction    Hip internal rotation    Hip external rotation     Knee flexion    Knee extension    Ankle dorsiflexion -5 18  Ankle plantarflexion 10 22  Ankle inversion 25 24  Ankle eversion 20 30   (Blank rows = not tested)  LOWER EXTREMITY MMT:  MMT Right eval Left eval  Hip flexion 5 5  Hip extension    Hip abduction 4 4  Hip adduction    Hip internal rotation    Hip external rotation    Knee flexion 5 5  Knee extension 5 5  Ankle dorsiflexion 3+ 5  Ankle plantarflexion    Ankle inversion 3+ 5  Ankle eversion 3+ 5   (Blank rows = not tested)  LOWER EXTREMITY SPECIAL TESTS:  Did not assess  FUNCTIONAL TESTS:  5 times sit to stand: unable to  bear weight without boot SLS: unable to bear weight  GAIT: Distance walked: Into clinic Assistive device utilized: cam boot Level of assistance: Complete Independence Comments: Reciprocal pattern                                                                                                                                TREATMENT DATE:   08/22/24                                  EXERCISE LOG  Exercise Repetitions and Resistance Comments  Nustep Lvl 2 x 15 mins   Rockerboard (seated) Front/back and left/right x 3.5 mins each   Dynadisc CW and CCW circles x 2 mins each   ABCs 2 reps   Marbles 16 marbles x 4 reps    ROM In all directions to tolerance    Blank cell = exercise not performed today    08/20/24                                  Nustep L2 LEs only x10 min Supine gastroc stretch with strap 2x30 Supine soleus stretch with strap 2x30 Supine ankle circles x10 CW & CCW Manual therapy: Grade II to III talar PA, gentle talocrural distraction Supine ankle PF red TB 3x10 Supine ankle DF red TB 3x10 Supine ankle inversion iso against ball 2x10 Supine SLR 2x10 Standing alternating heel raise 3x10  08/12/24 To be initiated next session    PATIENT EDUCATION:  Education details: Exam findings, POC Person educated: Patient Education method: Explanation Education  comprehension: verbalized understanding and needs further education  HOME EXERCISE PROGRAM: Ankle ABCs Marble Pick Ups  ASSESSMENT:  CLINICAL IMPRESSION: Pt arrives for today's treatment session reporting 5/10 right ankle pain.  Pt dons sneaker on right foot for duration of today's treatment session.  Pt states that her ankle feels weak when not in the boot.  Pt introduced to seated rockerboard and dyna-disc today with min cues for proper technique.  Pt requiring tactile cues to avoid knee movement with dyna-disc circles.  Pt also introduced to ankle ABCs and marble pick ups with good results.  ABCs and marble pick up added to pt's HEP.  Pt reported 6/10 right ankle pain at completion of today's treatment session.  Pt dons boot prior to leaving facility.   OBJECTIVE IMPAIRMENTS: Abnormal gait, decreased activity tolerance, decreased balance, decreased coordination, decreased endurance, decreased mobility, difficulty walking, decreased ROM, decreased strength, hypomobility, increased edema, increased fascial restrictions, increased muscle spasms, impaired flexibility, impaired UE functional use, improper body mechanics, postural dysfunction, and pain.   ACTIVITY LIMITATIONS: carrying, lifting, standing, squatting, stairs, transfers, hygiene/grooming, and locomotion level  PARTICIPATION LIMITATIONS: meal prep, cleaning, laundry, shopping, community activity, and yard work  PERSONAL FACTORS: Age, Fitness, Past/current experiences, and Time since  onset of injury/illness/exacerbation are also affecting patient's functional outcome.   REHAB POTENTIAL: Good  CLINICAL DECISION MAKING: Evolving/moderate complexity  EVALUATION COMPLEXITY: Moderate   GOALS: Goals reviewed with patient? Yes  SHORT TERM GOALS: Target date: 09/09/2024  Pt will be ind with initial HEP Baseline: Goal status: INITIAL  2.  Pt will demo at least neutral R ankle DF Baseline:  Goal status: INITIAL   LONG TERM  GOALS: Target date: 10/07/2024   Pt will be ind with management and progression of HEP Baseline:  Goal status: INITIAL  2.  Pt will be able to amb in the house with a normal reciprocal pattern without cam boot Baseline:  Goal status: INITIAL  3.  Pt will report improved pain by >/=50% Baseline:  Goal status: INITIAL  4.  Pt will be able to tolerate single leg stance on R LE for at least 10 sec Baseline:  Goal status: INITIAL  5.  Pt will have L = R ankle DF for stairs and transfers Baseline:  Goal status: INITIAL   PLAN:  PT FREQUENCY: 2x/week  PT DURATION: 8 weeks  PLANNED INTERVENTIONS: 97164- PT Re-evaluation, 97750- Physical Performance Testing, 97110-Therapeutic exercises, 97530- Therapeutic activity, [id number]- Neuromuscular re-education, 97535- Self Care, [id number]- Manual therapy, [id number]- Gait training, [phone]- Orthotic Initial, [phone]- Orthotic/Prosthetic subsequent, [phone] (1-2 muscles), 20561 (3+ muscles)- Dry Needling, Patient/Family education, Balance training, Stair training, Taping, Joint mobilization, Spinal mobilization, Cryotherapy, and Moist heat  No vaso, e-stim, US , traction, ionto per healthy blue medicaid  PLAN FOR NEXT SESSION: Initiate gentle subtalar/talocrural joint mobilization. Ankle mobility into DF. Ankle strengthening.   Delon DELENA Gosling, PTA, DPT 08/22/2024, 10:25 AM

## 2024-08-27 ENCOUNTER — Ambulatory Visit: Admitting: Physical Therapy

## 2024-08-27 DIAGNOSIS — M25671 Stiffness of right ankle, not elsewhere classified: Secondary | ICD-10-CM

## 2024-08-27 DIAGNOSIS — R2689 Other abnormalities of gait and mobility: Secondary | ICD-10-CM

## 2024-08-27 DIAGNOSIS — M6281 Muscle weakness (generalized): Secondary | ICD-10-CM

## 2024-08-27 DIAGNOSIS — M25571 Pain in right ankle and joints of right foot: Secondary | ICD-10-CM | POA: Diagnosis not present

## 2024-08-27 NOTE — Therapy (Signed)
 OUTPATIENT PHYSICAL THERAPY TREATMENT   Patient Name: [name] MRN: [id number] DOB:1981-07-17, 43 y.o., female Today's Date: 08/27/2024  END OF SESSION:  PT End of Session - 08/27/24 0939     Visit Number 4    Number of Visits 16    Date for Recertification  10/07/24    Authorization Type Medicaid    PT Start Time 0930    PT Stop Time 1010    PT Time Calculation (min) 40 min    Activity Tolerance Patient tolerated treatment well            Past Medical History:  Diagnosis Date   Abdominal pain, left lower quadrant 04/09/2010   Qualifier: Diagnosis of   By: Jakie MD [name]        Allergy    Anemia    Anxiety    Clotting disorder    Constipation    Depression    Diabetes mellitus without complication (HCC)    Family history of malignant neoplasm of gastrointestinal tract    Gastroparesis    Hemorrhage of rectum and anus 03/08/2010   Qualifier: Diagnosis of   By: Surface RN, Arland         IBS (irritable bowel syndrome)    constipation   Migraines    Neuromuscular disorder (HCC)    Neuropathy due to type 2 diabetes mellitus (HCC)    Ulcer    Past Surgical History:  Procedure Laterality Date   ABDOMINAL HYSTERECTOMY N/A 06/29/2016   Procedure: TOTAL ABDOMINAL HYSTERECTOMY;  Surgeon: Vonn VEAR Inch, MD;  Location: [facility];  Service: Gynecology;  Laterality: N/A;   BREAST BIOPSY     CESAREAN SECTION     CHOLECYSTECTOMY  09/19/2005   EYE SURGERY  09/20/1987   SALPINGOOPHORECTOMY Bilateral 06/29/2016   Procedure: BILATERAL SALPINGO OOPHORECTOMY;  Surgeon: Vonn VEAR Inch, MD;  Location: [facility];  Service: Gynecology;  Laterality: Bilateral;   TONSILLECTOMY  09/19/1990   Patient Active Problem List   Diagnosis Date Noted   Other osteoporosis without current pathological fracture 04/19/2024   Hyperlipidemia associated with type 2 diabetes mellitus (HCC) 05/07/2021   Small fiber neuropathy 04/24/2020   Migraine without aura and without status  migrainosus, not intractable 04/24/2020   Family history of hemophilia A 01/18/2017   Hx of total hysterectomy 06/29/2016   Insulin -requiring or dependent type II diabetes mellitus (HCC) 06/19/2013   GERD 05/07/2010   DEPRESSION 03/08/2010   CONSTIPATION 03/08/2010    PCP: Jolinda Norene HERO, DO  REFERRING PROVIDER: Delane Lye, MD  REFERRING DIAG: S92.214D: Nondisplaced fracture of cuboid bone of right foot, subsequent encounter  THERAPY DIAG:  No diagnosis found.  Rationale for Evaluation and Treatment: Rehabilitation  ONSET DATE: 06/03/24  SUBJECTIVE:   SUBJECTIVE STATEMENT: Reports no pain this morning. Was sore after last session. Will be seeing her doctor on Friday. Feels that there is less stiffness.   PERTINENT HISTORY: R rotator cuff injury  PAIN:  Are you having pain? Yes: NPRS scale: 0 currently, at worst >10 Pain location: R lateral foot Pain description: aching, thorobbing  Aggravating factors: When out of the boot Relieving factors: icing, voltaren gel and biofreeze, tylenol    PRECAUTIONS: None Latex and bee sting allergy  RED FLAGS: None   WEIGHT BEARING RESTRICTIONS: No  FALLS:  Has patient fallen in last 6 months? Yes. 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  DIAGNOSTIC FINDINGS:  07/02/24 CT IMPRESSION: 1. Mildly displaced intra-articular fracture of the cuboid. 2. No other acute osseous findings. 3. Mild soft tissue swelling  laterally in the midfoot.  07/18/24 MRI IMPRESSION: 1. No acute or significant findings demonstrated at the ankle or hindfoot. 2. Fracture of the distal cuboid with associated bone marrow edema, correlating with recent CT findings. 3. Bone marrow edema in the medial cuneiform and bases of the first through third metatarsals, consistent with nondisplaced fractures or bone contusions. 4. Mild midfoot soft tissue swelling without focal fluid collection.   PATIENT SURVEYS:  LEFS  Extreme difficulty/unable (0), Quite a bit of difficulty (1), Moderate difficulty (2), Little difficulty (3), No difficulty (4) Survey date:  08/12/24  Any of your usual work, housework or school activities 0  2. Usual hobbies, recreational or sporting activities 0  3. Getting into/out of the bath 2  4. Walking between rooms 2  5. Putting on socks/shoes 2  6. Squatting  3  7. Lifting an object, like a bag of groceries from the floor 4  8. Performing light activities around your home 4  9. Performing heavy activities around your home 0  10. Getting into/out of a car 3  11. Walking 2 blocks 0  12. Walking 1 mile 0  13. Going up/down 10 stairs (1 flight) 1  14. Standing for 1 hour 1  15.  sitting for 1 hour 4  16. Running on even ground 0  17. Running on uneven ground 0  18. Making sharp turns while running fast 0  19. Hopping  0  20. Rolling over in bed 4  Score total:  26     COGNITION: Overall cognitive status: Within functional limits for tasks assessed     SENSATION: WFL  EDEMA:  Mild  POSTURE: No Significant postural limitations  PALPATION: TTP along R ant tib and toe extensor tendons; slightly in R peroneal muscle belly but especially along peroneal tendon insertion  Noted atrophy in R calf vs L calf  LOWER EXTREMITY ROM:  Active ROM Right eval Left eval Right 12/9  Hip flexion     Hip extension     Hip abduction     Hip adduction     Hip internal rotation     Hip external  rotation     Knee flexion     Knee extension     Ankle dorsiflexion -5 18 18   Ankle plantarflexion 10 22   Ankle inversion 25 24 25   Ankle eversion 20 30 35   (Blank rows = not tested)  LOWER EXTREMITY MMT:  MMT Right eval Left eval  Hip flexion 5 5  Hip extension    Hip abduction 4 4  Hip adduction    Hip internal rotation    Hip external rotation    Knee flexion 5 5  Knee extension 5 5  Ankle dorsiflexion 3+ 5  Ankle plantarflexion    Ankle inversion 3+ 5  Ankle eversion 3+ 5   (Blank rows = not tested)  LOWER EXTREMITY SPECIAL TESTS:  Did not assess  FUNCTIONAL TESTS:  5 times sit to stand: unable to bear weight without boot SLS: unable to bear weight  GAIT: Distance walked: Into clinic Assistive device utilized: cam boot Level of assistance: Complete Independence Comments: Reciprocal pattern                                                                                                                                TREATMENT DATE:  08/27/24 Nustep L4 x 15 min Standing gastroc stretch x 30 Standing soleus stretch x 30 Sitting ankle PF green TB 2x10 Sitting ankle DF green TB 2x10 Sitting ankle inversion green TB 2x8 Sitting ankle eversion green TB 2x10 Marble pick up 3x10 Gait training out of boot -- cues to keep foot from externally rotating with focus on rocking from heel to toe and narrowing BOS   08/22/24                                  EXERCISE LOG  Exercise Repetitions and Resistance Comments  Nustep Lvl 2 x 15 mins   Rockerboard (seated) Front/back and left/right x 3.5 mins each   Dynadisc CW and CCW circles x 2 mins each   ABCs 2 reps   Marbles 16 marbles x 4 reps    ROM In all directions to tolerance    Blank cell = exercise not performed today    08/20/24                                  Nustep L2 LEs only x10 min Supine gastroc stretch with strap 2x30 Supine soleus stretch with strap 2x30 Supine ankle circles x10 CW & CCW Manual  therapy: Grade II to III talar PA, gentle talocrural distraction Supine ankle PF red TB 3x10 Supine ankle DF red TB 3x10 Supine ankle inversion iso against ball 2x10 Supine SLR 2x10 Standing alternating heel raise 3x10  08/12/24 To be initiated next session    PATIENT EDUCATION:  Education details: Exam findings, POC Person educated: Patient Education method: Explanation Education comprehension: verbalized understanding and needs further education  HOME EXERCISE PROGRAM: Ankle ABCs Marble Pick Ups Access Code: [email or web address] URL: [email or web address] Date: 08/27/2024 Prepared by: Jhony Antrim April Marie Anikin Prosser  Exercises - Seated Calf Towel Stretch  - 1 x daily - 7 x weekly - 2 sets - 30 sec hold - Long Sitting Ankle Plantar Flexion with Resistance  - 1 x daily - 7 x weekly - 3 sets - 10 reps - Seated Ankle Dorsiflexion with Resistance  - 1 x daily - 7 x weekly - 3 sets - 10 reps - Alternating Heel Raises  - 1 x daily - 7 x weekly - 3 sets - 10 reps - Seated Ankle Inversion with Resistance and Legs Crossed  - 1 x daily -  7 x weekly - 2 sets - 8-10 reps  ASSESSMENT:  CLINICAL IMPRESSION: Pt's ROM has greatly improved and is now equal to L foot. Greatest weakness is with ankle inversion. Worked on improving gait quality this morning.   OBJECTIVE IMPAIRMENTS: Abnormal gait, decreased activity tolerance, decreased balance, decreased coordination, decreased endurance, decreased mobility, difficulty walking, decreased ROM, decreased strength, hypomobility, increased edema, increased fascial restrictions, increased muscle spasms, impaired flexibility, impaired UE functional use, improper body mechanics, postural dysfunction, and pain.   ACTIVITY LIMITATIONS: carrying, lifting, standing, squatting, stairs, transfers, hygiene/grooming, and locomotion level  PARTICIPATION LIMITATIONS: meal prep, cleaning, laundry, shopping, community activity, and yard work  PERSONAL FACTORS:  Age, Fitness, Past/current experiences, and Time since onset of injury/illness/exacerbation are also affecting patient's functional outcome.   REHAB POTENTIAL: Good  CLINICAL DECISION MAKING: Evolving/moderate complexity  EVALUATION COMPLEXITY: Moderate   GOALS: Goals reviewed with patient? Yes  SHORT TERM GOALS: Target date: 09/09/2024  Pt will be ind with initial HEP Baseline: Goal status: INITIAL  2.  Pt will demo at least neutral R ankle DF Baseline:  Goal status: MET 08/27/24   LONG TERM GOALS: Target date: 10/07/2024   Pt will be ind with management and progression of HEP Baseline:  Goal status: INITIAL  2.  Pt will be able to amb in the house with a normal reciprocal pattern without cam boot Baseline:  Goal status: INITIAL  3.  Pt will report improved pain by >/=50% Baseline:  Goal status: INITIAL  4.  Pt will be able to tolerate single leg stance on R LE for at least 10 sec Baseline:  Goal status: INITIAL  5.  Pt will have L = R ankle DF for stairs and transfers Baseline:  Goal status: INITIAL   PLAN:  PT FREQUENCY: 2x/week  PT DURATION: 8 weeks  PLANNED INTERVENTIONS: 97164- PT Re-evaluation, 97750- Physical Performance Testing, 97110-Therapeutic exercises, 97530- Therapeutic activity, [id number]- Neuromuscular re-education, 97535- Self Care, 02859- Manual therapy, [id number]- Gait training, [phone]- Orthotic Initial, [phone]- Orthotic/Prosthetic subsequent, [phone] (1-2 muscles), 20561 (3+ muscles)- Dry Needling, Patient/Family education, Balance training, Stair training, Taping, Joint mobilization, Spinal mobilization, Cryotherapy, and Moist heat  No vaso, e-stim, US , traction, ionto per healthy blue medicaid  PLAN FOR NEXT SESSION: Initiate gentle subtalar/talocrural joint mobilization. Ankle mobility into DF. Ankle strengthening.   Bryer Cozzolino April Ma L Starlit Raburn, PT, DPT 08/27/2024, 9:39 AM

## 2024-08-29 ENCOUNTER — Ambulatory Visit

## 2024-09-03 ENCOUNTER — Ambulatory Visit

## 2024-09-03 DIAGNOSIS — S92214D Nondisplaced fracture of cuboid bone of right foot, subsequent encounter for fracture with routine healing: Secondary | ICD-10-CM | POA: Diagnosis not present

## 2024-09-03 DIAGNOSIS — M25671 Stiffness of right ankle, not elsewhere classified: Secondary | ICD-10-CM

## 2024-09-03 DIAGNOSIS — M6281 Muscle weakness (generalized): Secondary | ICD-10-CM

## 2024-09-03 DIAGNOSIS — R2689 Other abnormalities of gait and mobility: Secondary | ICD-10-CM

## 2024-09-03 DIAGNOSIS — M25571 Pain in right ankle and joints of right foot: Secondary | ICD-10-CM

## 2024-09-03 NOTE — Therapy (Signed)
 OUTPATIENT PHYSICAL THERAPY TREATMENT   Patient Name: [name] MRN: [id number] DOB:10/15/1980, 43 y.o., female Today's Date: 09/03/2024  END OF SESSION:  PT End of Session - 09/03/24 1350     Visit Number 5    Number of Visits 16    Date for Recertification  10/07/24    Authorization Type Medicaid    Authorization - Number of Visits 6   10/10/24   PT Start Time 1345    PT Stop Time 1433    PT Time Calculation (min) 48 min    Activity Tolerance Patient tolerated treatment well            Past Medical History:  Diagnosis Date   Abdominal pain, left lower quadrant 04/09/2010   Qualifier: Diagnosis of   By: Jakie MD [name]        Allergy    Anemia    Anxiety    Clotting disorder    Constipation    Depression    Diabetes mellitus without complication (HCC)    Family history of malignant neoplasm of gastrointestinal tract    Gastroparesis    Hemorrhage of rectum and anus 03/08/2010   Qualifier: Diagnosis of   By: Surface RN, Arland         IBS (irritable bowel syndrome)    constipation   Migraines    Neuromuscular disorder (HCC)    Neuropathy due to type 2 diabetes mellitus (HCC)    Ulcer    Past Surgical History:  Procedure Laterality Date   ABDOMINAL HYSTERECTOMY N/A 06/29/2016   Procedure: TOTAL ABDOMINAL HYSTERECTOMY;  Surgeon: Vonn VEAR Inch, MD;  Location: [facility];  Service: Gynecology;  Laterality: N/A;   BREAST BIOPSY     CESAREAN SECTION     CHOLECYSTECTOMY  09/19/2005   EYE SURGERY  09/20/1987   SALPINGOOPHORECTOMY Bilateral 06/29/2016   Procedure: BILATERAL SALPINGO OOPHORECTOMY;  Surgeon: Vonn VEAR Inch, MD;  Location: [facility];  Service: Gynecology;  Laterality: Bilateral;   TONSILLECTOMY  09/19/1990   Patient Active Problem List   Diagnosis Date Noted   Other osteoporosis without current pathological fracture 04/19/2024   Hyperlipidemia associated with type 2 diabetes mellitus (HCC) 05/07/2021   Small fiber neuropathy 04/24/2020    Migraine without aura and without status migrainosus, not intractable 04/24/2020   Family history of hemophilia A 01/18/2017   Hx of total hysterectomy 06/29/2016   Insulin -requiring or dependent type II diabetes mellitus (HCC) 06/19/2013   GERD 05/07/2010   DEPRESSION 03/08/2010   CONSTIPATION 03/08/2010    PCP: Jolinda Norene HERO, DO  REFERRING PROVIDER: Delane Lye, MD  REFERRING DIAG: S92.214D: Nondisplaced fracture of cuboid bone of right foot, subsequent encounter  THERAPY DIAG:  Pain in right ankle and joints of right foot  Stiffness of right ankle, not elsewhere classified  Muscle weakness (generalized)  Other abnormalities of gait and mobility  Rationale for Evaluation and Treatment: Rehabilitation  ONSET DATE: 06/03/24  SUBJECTIVE:   SUBJECTIVE STATEMENT: Reports no pain this morning. Was sore after last session. Will be seeing her doctor on Friday. Feels that there is less stiffness.   PERTINENT HISTORY: R rotator cuff injury  PAIN:  Are you having pain? Yes: NPRS scale: 0 currently, at worst >10 Pain location: R lateral foot Pain description: aching, thorobbing  Aggravating factors: When out of the boot Relieving factors: icing, voltaren gel and biofreeze, tylenol    PRECAUTIONS: None Latex and bee sting allergy  RED FLAGS: None   WEIGHT BEARING RESTRICTIONS:  No  FALLS:  Has patient fallen in last 6 months? Yes. 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  DIAGNOSTIC FINDINGS:  07/02/24 CT IMPRESSION: 1. Mildly displaced intra-articular fracture of the cuboid. 2. No other acute osseous findings. 3. Mild soft tissue swelling laterally in the midfoot.  07/18/24 MRI IMPRESSION: 1. No acute or significant findings demonstrated at the ankle or hindfoot. 2. Fracture of the distal cuboid with associated bone marrow edema, correlating with recent CT findings. 3. Bone marrow edema in the medial cuneiform and bases of the first through third metatarsals, consistent with nondisplaced fractures or bone contusions. 4. Mild midfoot soft tissue swelling without focal fluid collection.   PATIENT SURVEYS:  LEFS  Extreme difficulty/unable (0), Quite a bit of difficulty (1), Moderate difficulty (2), Little difficulty (3), No difficulty (4) Survey date:  08/12/24  Any of your usual work, housework or school activities 0  2. Usual hobbies, recreational or sporting activities 0  3. Getting into/out of the bath 2  4. Walking between rooms 2  5. Putting on socks/shoes 2  6. Squatting  3  7. Lifting an object, like a bag of groceries from the floor 4  8. Performing light activities around your home 4  9. Performing heavy activities around your home 0  10. Getting into/out of a car 3  11. Walking 2 blocks 0  12. Walking 1 mile 0  13. Going up/down 10 stairs (1 flight) 1  14. Standing for 1 hour 1  15.  sitting for 1 hour 4  16. Running on even ground 0  17. Running on uneven ground 0  18. Making sharp turns while running fast 0  19. Hopping  0  20. Rolling over in bed 4  Score total:  26     COGNITION: Overall cognitive status: Within functional limits for tasks assessed     SENSATION: WFL  EDEMA:  Mild  POSTURE: No Significant postural limitations  PALPATION: TTP along R ant tib and toe extensor tendons; slightly in R peroneal muscle belly but especially along peroneal tendon insertion  Noted atrophy in R calf  vs L calf  LOWER EXTREMITY ROM:  Active ROM Right eval Left eval Right 12/9  Hip flexion     Hip extension     Hip abduction     Hip adduction     Hip internal rotation     Hip external rotation     Knee flexion     Knee extension     Ankle dorsiflexion -5 18 18   Ankle plantarflexion 10 22   Ankle inversion 25 24 25   Ankle eversion 20 30 35   (Blank rows = not tested)  LOWER EXTREMITY MMT:  MMT Right eval Left eval  Hip flexion 5 5  Hip extension    Hip abduction 4 4  Hip adduction    Hip internal rotation    Hip external rotation    Knee flexion 5 5  Knee  extension 5 5  Ankle dorsiflexion 3+ 5  Ankle plantarflexion    Ankle inversion 3+ 5  Ankle eversion 3+ 5   (Blank rows = not tested)  LOWER EXTREMITY SPECIAL TESTS:  Did not assess  FUNCTIONAL TESTS:  5 times sit to stand: unable to bear weight without boot SLS: unable to bear weight  GAIT: Distance walked: Into clinic Assistive device utilized: cam boot Level of assistance: Complete Independence Comments: Reciprocal pattern                                                                                                                                TREATMENT DATE:   09/03/24                              EXERCISE LOG  Exercise Repetitions and Resistance Comments  Nustep Lvl 2 x 15 mins   Rockerboard (seated)    Dynadisc Flex/ext; left/right; CW and CCW x 2.5 mins each   ABCs    Marbles    ROM In all directions to tolerance    Blank cell = exercise not performed today   Manual Therapy Soft Tissue Mobilization: right foot, STW/M to right plantar surface and heel to decrease pain and tone    08/27/24 Nustep L4 x 15 min Standing gastroc stretch x 30 Standing soleus stretch x 30 Sitting ankle PF green TB 2x10 Sitting ankle DF green TB 2x10 Sitting ankle inversion green TB 2x8 Sitting ankle eversion green TB 2x10 Marble pick up 3x10 Gait training out of boot -- cues to keep foot from  externally rotating with focus on rocking from heel to toe and narrowing BOS  08/22/24                                  EXERCISE LOG  Exercise Repetitions and Resistance Comments  Nustep Lvl 2 x 15 mins   Rockerboard (seated) Front/back and left/right x 3.5 mins each   Dynadisc CW and CCW circles x 2 mins each   ABCs 2 reps   Marbles 16 marbles x 4 reps    ROM In all directions to tolerance    Blank cell = exercise not performed today   PATIENT EDUCATION:  Education details: Exam findings, POC Person educated: Patient Education method: Explanation Education comprehension: verbalized understanding and needs further education  HOME EXERCISE PROGRAM: Ankle ABCs Marble Pick Ups Access Code: [email or web address] URL: [email or web address] Date: 08/27/2024 Prepared by: Gellen April Marie Nonato  Exercises - Seated Calf Towel Stretch  - 1 x daily - 7 x weekly - 2 sets - 30 sec hold - Long Sitting Ankle Plantar Flexion with Resistance  - 1 x daily - 7 x weekly - 3 sets - 10 reps - Seated Ankle Dorsiflexion with Resistance  - 1 x daily - 7 x  weekly - 3 sets - 10 reps - Alternating Heel Raises  - 1 x daily - 7 x weekly - 3 sets - 10 reps - Seated Ankle Inversion with Resistance and Legs Crossed  - 1 x daily - 7 x weekly - 2 sets - 8-10 reps  ASSESSMENT:  CLINICAL IMPRESSION: Pt arrives for today's treatment session reporting 7/10 right ankle pain.  Pt states that she wore her regular shoes the past two days and her pain was 10/10 this morning when she woke up.  MD states that bone is in delayed healing and would like for her to return in four weeks.  Today's treatment session concentrating on ROM and STW.  Pt reported 6/10 right ankle pain at completion of today's treatment session.   OBJECTIVE IMPAIRMENTS: Abnormal gait, decreased activity tolerance, decreased balance, decreased coordination, decreased endurance, decreased mobility, difficulty walking, decreased ROM, decreased  strength, hypomobility, increased edema, increased fascial restrictions, increased muscle spasms, impaired flexibility, impaired UE functional use, improper body mechanics, postural dysfunction, and pain.   ACTIVITY LIMITATIONS: carrying, lifting, standing, squatting, stairs, transfers, hygiene/grooming, and locomotion level  PARTICIPATION LIMITATIONS: meal prep, cleaning, laundry, shopping, community activity, and yard work  PERSONAL FACTORS: Age, Fitness, Past/current experiences, and Time since onset of injury/illness/exacerbation are also affecting patient's functional outcome.   REHAB POTENTIAL: Good  CLINICAL DECISION MAKING: Evolving/moderate complexity  EVALUATION COMPLEXITY: Moderate   GOALS: Goals reviewed with patient? Yes  SHORT TERM GOALS: Target date: 09/09/2024  Pt will be ind with initial HEP Baseline: Goal status: MET  2.  Pt will demo at least neutral R ankle DF Baseline:  Goal status: MET 08/27/24   LONG TERM GOALS: Target date: 10/07/2024   Pt will be ind with management and progression of HEP Baseline:  Goal status: INITIAL  2.  Pt will be able to amb in the house with a normal reciprocal pattern without cam boot Baseline:  Goal status: INITIAL  3.  Pt will report improved pain by >/=50% Baseline:  Goal status: INITIAL  4.  Pt will be able to tolerate single leg stance on R LE for at least 10 sec Baseline:  Goal status: INITIAL  5.  Pt will have L = R ankle DF for stairs and transfers Baseline:  Goal status: INITIAL   PLAN:  PT FREQUENCY: 2x/week  PT DURATION: 8 weeks  PLANNED INTERVENTIONS: 97164- PT Re-evaluation, 97750- Physical Performance Testing, 97110-Therapeutic exercises, 97530- Therapeutic activity, [id number]- Neuromuscular re-education, 97535- Self Care, 02859- Manual therapy, [id number]- Gait training, [phone]- Orthotic Initial, [id number]- Orthotic/Prosthetic subsequent, [id number] (1-2 muscles), 20561 (3+ muscles)- Dry Needling, Patient/Family  education, Balance training, Stair training, Taping, Joint mobilization, Spinal mobilization, Cryotherapy, and Moist heat  No vaso, e-stim, US , traction, ionto per healthy [employer] medicaid  PLAN FOR NEXT SESSION: Initiate gentle subtalar/talocrural joint mobilization. Ankle mobility into DF. Ankle strengthening.   Delon DELENA Gosling, PTA, DPT 09/03/2024, 2:45 PM

## 2024-09-05 ENCOUNTER — Ambulatory Visit: Admitting: Physical Therapy

## 2024-09-09 ENCOUNTER — Ambulatory Visit

## 2024-09-10 ENCOUNTER — Ambulatory Visit: Admitting: Physical Therapy

## 2024-09-10 ENCOUNTER — Encounter: Payer: Self-pay | Admitting: Physical Therapy

## 2024-09-10 DIAGNOSIS — R2689 Other abnormalities of gait and mobility: Secondary | ICD-10-CM

## 2024-09-10 DIAGNOSIS — M25671 Stiffness of right ankle, not elsewhere classified: Secondary | ICD-10-CM

## 2024-09-10 DIAGNOSIS — M6281 Muscle weakness (generalized): Secondary | ICD-10-CM

## 2024-09-10 DIAGNOSIS — M25571 Pain in right ankle and joints of right foot: Secondary | ICD-10-CM

## 2024-09-10 NOTE — Therapy (Signed)
 " OUTPATIENT PHYSICAL THERAPY TREATMENT   Patient Name: [name] MRN: [id number] DOB:25-Aug-1981, 43 y.o., female Today's Date: 09/10/2024  END OF SESSION:      Past Medical History:  Diagnosis Date   Abdominal pain, left lower quadrant 04/09/2010   Qualifier: Diagnosis of   By: Jakie MD [name]        Allergy    Anemia    Anxiety    Clotting disorder    Constipation    Depression    Diabetes mellitus without complication (HCC)    Family history of malignant neoplasm of gastrointestinal tract    Gastroparesis    Hemorrhage of rectum and anus 03/08/2010   Qualifier: Diagnosis of   By: Surface RN, Arland         IBS (irritable bowel syndrome)    constipation   Migraines    Neuromuscular disorder (HCC)    Neuropathy due to type 2 diabetes mellitus (HCC)    Ulcer    Past Surgical History:  Procedure Laterality Date   ABDOMINAL HYSTERECTOMY N/A 06/29/2016   Procedure: TOTAL ABDOMINAL HYSTERECTOMY;  Surgeon: Vonn VEAR Inch, MD;  Location: [facility];  Service: Gynecology;  Laterality: N/A;   BREAST BIOPSY     CESAREAN SECTION     CHOLECYSTECTOMY  09/19/2005   EYE SURGERY  09/20/1987   SALPINGOOPHORECTOMY Bilateral 06/29/2016   Procedure: BILATERAL SALPINGO OOPHORECTOMY;  Surgeon: Vonn VEAR Inch, MD;  Location: [facility];  Service: Gynecology;  Laterality: Bilateral;   TONSILLECTOMY  09/19/1990   Patient Active Problem List   Diagnosis Date Noted   Other osteoporosis without current pathological fracture 04/19/2024   Hyperlipidemia associated with type 2 diabetes mellitus (HCC) 05/07/2021   Small fiber neuropathy 04/24/2020   Migraine without aura and without status migrainosus, not intractable 04/24/2020   Family history of hemophilia A 01/18/2017   Hx of total hysterectomy 06/29/2016   Insulin -requiring or dependent type II diabetes mellitus (HCC) 06/19/2013   GERD 05/07/2010   DEPRESSION 03/08/2010   CONSTIPATION 03/08/2010    PCP: Jolinda Norene HERO,  DO  REFERRING PROVIDER: Delane Lye, MD  REFERRING DIAG: S92.214D: Nondisplaced fracture of cuboid bone of right foot, subsequent encounter  THERAPY DIAG:  Pain in right ankle and joints of right foot  Stiffness of right ankle, not elsewhere classified  Muscle weakness (generalized)  Other abnormalities of gait and mobility  Rationale for Evaluation and Treatment: Rehabilitation  ONSET DATE: 06/03/24  SUBJECTIVE:   SUBJECTIVE STATEMENT: Reports less pain today. Has been getting increased pain mostly along her talar head.   PERTINENT HISTORY: R rotator cuff injury  PAIN:  Are you having pain? Yes: NPRS scale: 3-4 currently, at worst >10 Pain location: R lateral foot Pain description: aching, thorobbing  Aggravating factors: When out of the boot Relieving factors: icing, voltaren gel and biofreeze, tylenol    PRECAUTIONS: None Latex and bee sting allergy  RED FLAGS: None   WEIGHT BEARING RESTRICTIONS: No  FALLS:  Has patient fallen in last 6 months? Yes. 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  DIAGNOSTIC FINDINGS:  07/02/24 CT IMPRESSION: 1. Mildly displaced intra-articular fracture of the cuboid. 2. No other acute osseous findings. 3. Mild soft tissue swelling laterally in the midfoot.  07/18/24 MRI IMPRESSION: 1. No acute or significant findings demonstrated at the ankle or hindfoot. 2. Fracture of the distal cuboid with associated bone marrow  edema, correlating with recent CT findings. 3. Bone marrow edema in the medial cuneiform and bases of the first through third metatarsals, consistent with nondisplaced fractures or bone contusions. 4. Mild midfoot soft tissue swelling without focal fluid collection.   PATIENT SURVEYS:  LEFS  Extreme difficulty/unable (0), Quite a bit of difficulty (1), Moderate difficulty (2), Little difficulty (3), No difficulty (4) Survey date:  08/12/24 09/10/24  Any of your usual work, housework or school activities 0 1  2. Usual hobbies, recreational or sporting activities 0 0  3. Getting into/out of the bath 2 1  4. Walking between rooms 2 2  5. Putting on socks/shoes 2 2  6. Squatting  3 1  7. Lifting an object, like a bag of groceries from the floor 4 1  8. Performing light activities around your home 4 2  9. Performing heavy activities around your home 0 0  10. Getting into/out of a car 3 2  11. Walking 2 blocks 0 0  12. Walking 1 mile 0 0  13. Going up/down 10 stairs (1 flight) 1 1  14. Standing for 1 hour 1 1  15.  sitting for 1 hour 4 3  16. Running on even ground 0 0  17. Running on uneven ground 0 0  18. Making sharp turns while running fast 0 0  19. Hopping  0 0  20. Rolling over in bed 4 3  Score total:  26 20     COGNITION: Overall cognitive status: Within functional limits for tasks assessed     SENSATION: WFL  EDEMA:  Mild  POSTURE: No Significant postural limitations  PALPATION: TTP along R ant tib and toe extensor tendons; slightly in R peroneal muscle belly but especially along peroneal tendon insertion  Noted atrophy in R calf vs L calf  LOWER EXTREMITY ROM:  Active ROM Right eval Left eval Right 12/9  Hip flexion     Hip extension     Hip abduction     Hip adduction     Hip internal rotation     Hip external rotation     Knee flexion     Knee extension     Ankle dorsiflexion -5 18 18   Ankle plantarflexion 10 22   Ankle inversion 25 24 25   Ankle  eversion 20 30 35   (Blank rows = not tested)  LOWER EXTREMITY MMT:  MMT Right eval Left eval  Hip flexion 5 5  Hip extension    Hip abduction 4 4  Hip adduction    Hip internal rotation    Hip external rotation    Knee flexion 5 5  Knee extension 5 5  Ankle dorsiflexion 3+ 5  Ankle plantarflexion    Ankle inversion 3+ 5  Ankle eversion 3+ 5   (Blank rows = not tested)  LOWER EXTREMITY SPECIAL TESTS:  Did not assess  FUNCTIONAL TESTS:  5 times sit to stand: unable to bear weight without boot SLS: unable to bear weight (eval)  10.09 sec (09/10/24)  GAIT: Distance walked: Into clinic Assistive  device utilized: cam boot Level of assistance: Complete Independence Comments: Reciprocal pattern                                                                                                                                TREATMENT DATE:  09/10/24      EXERCISE LOG  Exercise Repetitions and Resistance Comments  Nustep Lvl 4 x 15 mins   Standing gastroc stretch X30   Standing soleus stretch X30   Ankle PF + toe flexion AROM x10   Ankle PF + toe flexion + inv AROM x10   Sitting heel raise x20   Sitting heel raise + ball squeeze x10    Blank cell = exercise not performed today     09/03/24                              EXERCISE LOG  Exercise Repetitions and Resistance Comments  Nustep Lvl 2 x 15 mins   Rockerboard (seated)    Dynadisc Flex/ext; left/right; CW and CCW x 2.5 mins each   ABCs    Marbles    ROM In all directions to tolerance    Blank cell = exercise not performed today   Manual Therapy Soft Tissue Mobilization: right foot, STW/M to right plantar surface and heel to decrease pain and tone    08/27/24 Nustep L4 x 15 min Standing gastroc stretch x 30 Standing soleus stretch x 30 Sitting ankle PF green TB 2x10 Sitting ankle DF green TB 2x10 Sitting ankle inversion green TB 2x8 Sitting ankle eversion green TB 2x10 Marble pick up 3x10 Gait  training out of boot -- cues to keep foot from externally rotating with focus on rocking from heel to toe and narrowing BOS  08/22/24                                  EXERCISE LOG  Exercise Repetitions and Resistance Comments  Nustep Lvl 2 x 15 mins   Rockerboard (seated) Front/back and left/right x 3.5 mins each   Dynadisc CW and CCW circles x 2 mins each   ABCs 2 reps   Marbles 16 marbles x 4 reps    ROM In all directions to tolerance    Blank cell = exercise not performed today   PATIENT EDUCATION:  Education details: Exam findings, POC Person educated: Patient Education method: Explanation Education comprehension: verbalized understanding and needs further education  HOME EXERCISE PROGRAM: Ankle ABCs Marble Pick Ups Access Code: [email or web address] URL: [email or web address] Date: 08/27/2024 Prepared by: Lakin Rhine April Marie Myran Arcia  Exercises - Seated Calf Towel Stretch  - 1 x daily - 7 x weekly - 2 sets - 30 sec hold - Long Sitting Ankle Plantar Flexion with Resistance  - 1 x daily - 7 x weekly - 3 sets - 10  reps - Seated Ankle Dorsiflexion with Resistance  - 1 x daily - 7 x weekly - 3 sets - 10 reps - Alternating Heel Raises  - 1 x daily - 7 x weekly - 3 sets - 10 reps - Seated Ankle Inversion with Resistance and Legs Crossed  - 1 x daily - 7 x weekly - 2 sets - 8-10 reps  ASSESSMENT:  CLINICAL IMPRESSION: Pt reports less pain today vs last session. Some increased s/s of ant tib and toe extensor tendon irritation. Rechecked pt's goals -- she has met her ROM goals. Continued to work on increasing calf muscle strength and overall stability. Improving weight bearing tolerance as demonstrated by her ability to perform SLS for at least 10 sec now. Pt will benefit from continued PT to fully meet the rest of her goals and improve her LEFS.   OBJECTIVE IMPAIRMENTS: Abnormal gait, decreased activity tolerance, decreased balance, decreased coordination, decreased endurance,  decreased mobility, difficulty walking, decreased ROM, decreased strength, hypomobility, increased edema, increased fascial restrictions, increased muscle spasms, impaired flexibility, impaired UE functional use, improper body mechanics, postural dysfunction, and pain.   ACTIVITY LIMITATIONS: carrying, lifting, standing, squatting, stairs, transfers, hygiene/grooming, and locomotion level  PARTICIPATION LIMITATIONS: meal prep, cleaning, laundry, shopping, community activity, and yard work  PERSONAL FACTORS: Age, Fitness, Past/current experiences, and Time since onset of injury/illness/exacerbation are also affecting patient's functional outcome.   REHAB POTENTIAL: Good  CLINICAL DECISION MAKING: Evolving/moderate complexity  EVALUATION COMPLEXITY: Moderate   GOALS: Goals reviewed with patient? Yes  SHORT TERM GOALS: Target date: 09/09/2024  Pt will be ind with initial HEP Baseline: Goal status: MET  2.  Pt will demo at least neutral R ankle DF Baseline:  Goal status: MET 08/27/24   LONG TERM GOALS: Target date: 10/07/2024   Pt will be ind with management and progression of HEP Baseline:  Goal status: IN PROGRESS  2.  Pt will be able to amb in the house with a normal reciprocal pattern without cam boot Baseline:  09/10/24: 4-6 hours without boot Goal status: IN PROGRESS  3.  Pt will report improved pain by >/=50% Baseline:  09/10/24: 30% improved it depends on the day Goal status: IN PROGRESS  4.  Pt will be able to tolerate single leg stance on R LE for at least 10 sec Baseline: unable 09/10/24: 10.09 sec on R Goal status: MET  5.  Pt will have L = R ankle DF for stairs and transfers Baseline:  09/10/24: see ROM chart Goal status: MET   PLAN:  PT FREQUENCY: 2x/week  PT DURATION: 8 weeks  PLANNED INTERVENTIONS: 97164- PT Re-evaluation, 97750- Physical Performance Testing, 97110-Therapeutic exercises, 97530- Therapeutic activity, [id number]- Neuromuscular  re-education, 97535- Self Care, 02859- Manual therapy, [id number]- Gait training, [phone]- Orthotic Initial, [phone]- Orthotic/Prosthetic subsequent, [phone] (1-2 muscles), 20561 (3+ muscles)- Dry Needling, Patient/Family education, Balance training, Stair training, Taping, Joint mobilization, Spinal mobilization, Cryotherapy, and Moist heat  No vaso, e-stim, US , traction, ionto per healthy [employer] med[employer]  PLAN FOR NEXT SESSION: Initiate gentle subtalar/talocrural joint mobilization. Ankle mobility into DF. Ankle strengthening.   Lannis Lichtenwalner April Ma L Giovanna Kemmerer, PT, DPT 09/10/2024, 12:48 PM  "

## 2024-09-20 ENCOUNTER — Encounter: Payer: Self-pay | Admitting: Family Medicine

## 2024-09-20 ENCOUNTER — Ambulatory Visit: Payer: Self-pay | Admitting: Family Medicine

## 2024-09-20 NOTE — Progress Notes (Deleted)
 "  Subjective: CC:DM PCP: [name] HERO, DO YEP:Cprunmpj [name] is a 44 y.o. female presenting to clinic today for:  Type 2 Diabetes with hyperlipidemia:  Glucometer:***.   ROS: ***dizziness, LOC, polyuria, polydipsia, unintended weight loss/gain, foot ulcerations, numbness or tingling in extremities, shortness of breath or chest pain.   Diabetes Health Maintenance Due  Topic Date Due   FOOT EXAM  10/05/2024   HEMOGLOBIN A1C  10/06/2024   OPHTHALMOLOGY EXAM  10/24/2024    ROS: Per HPI  Allergies[1] Past Medical History:  Diagnosis Date   Abdominal pain, left lower quadrant 04/09/2010   Qualifier: Diagnosis of   By: Jakie MD [name]        Allergy    Anemia    Anxiety    Clotting disorder    Constipation    Depression    Diabetes mellitus without complication (HCC)    Family history of malignant neoplasm of gastrointestinal tract    Gastroparesis    Hemorrhage of rectum and anus 03/08/2010   Qualifier: Diagnosis of   By: Surface RN, Arland         IBS (irritable bowel syndrome)    constipation   Migraines    Neuromuscular disorder (HCC)    Neuropathy due to type 2 diabetes mellitus (HCC)    Ulcer    Current Medications[2] Social History   Socioeconomic History   Marital status: Divorced    Spouse name: Not on file   Number of children: 2   Years of education: Not on file   Highest education level: Some college, no degree  Occupational History   Occupation: comptroller    Comment: part time   Occupation: substitutes at [employer]    Comment: part time  Tobacco Use   Smoking status: Former    Current packs/day: 0.00    Types: Cigarettes    Start date: 06/24/2004    Quit date: 06/24/2014    Years since quitting: 10.2   Smokeless tobacco: Never  Vaping Use   Vaping status: Never Used  Substance and Sexual Activity   Alcohol use: Yes    Comment: once in a while   Drug use: No   Sexual activity: Not Currently    Birth  control/protection: Surgical    Comment: hyst  Other Topics Concern   Not on file  Social History Narrative   Not on file   Social Drivers of Health   Tobacco Use: Medium Risk (09/10/2024)   Patient History    Smoking Tobacco Use: Former    Smokeless Tobacco Use: Never    Passive Exposure: Not on file  Financial Resource Strain: Patient Declined (05/28/2024)   Received from [facility]   Overall Financial Resource Strain (CARDIA)    How hard is it for you to pay for the very basics like food, housing, medical care, and heating?: Patient declined  Food Insecurity: Patient Declined (05/28/2024)   Received from [facility]   Epic    Within the past 12 months, you worried that your food would run out before you got the money to buy more.: Patient declined    Within the past 12 months, the food you bought just didn't last and you didn't have money to get more.: Patient declined  Transportation Needs: Patient Declined (05/28/2024)   Received from [facility]    In the past 12 months, has lack of transportation kept you from medical appointments or from getting medications?: Patient declined  In the past 12 months, has lack of transportation kept you from meetings, work, or from getting things needed for daily living?: Patient declined  Physical Activity: Unknown (05/28/2024)   Received from [facility]   Exercise Vital Sign    On average, how many days per week do you engage in moderate to strenuous exercise (like a brisk walk)?: Patient declined    Minutes of Exercise per Session: Not on file  Stress: Patient Declined (05/28/2024)   Received from [facility] - Occupational Stress Questionnaire    Do you feel stress - tense, restless, nervous, or anxious, or unable to sleep at night because your mind is troubled all the time - these days?: Patient declined  Social Connections: Socially Integrated (05/28/2024)   Received from [facility]   Social Network    How would you rate your social network (family, work, friends)?: Good participation with social networks  Intimate Partner Violence: Not At Risk (05/28/2024)   Received from [facility]   HITS    Over the last 12 months how often did your partner physically hurt you?: Never    Over the last 12 months how often did your partner insult you or talk down to you?: Never    Over the last 12 months how often did your partner threaten you with physical harm?: Never    Over the last 12 months how often did your partner scream or curse at you?: Never  Depression (PHQ2-9): Low Risk (06/21/2024)   Depression (PHQ2-9)    PHQ-2 Score: 2  Recent Concern: Depression (PHQ2-9) - Medium Risk (04/05/2024)   Depression (PHQ2-9)    PHQ-2 Score: 5  Alcohol Screen: Low Risk (10/06/2023)   Alcohol Screen    Last Alcohol Screening Score (AUDIT): 0  Housing: Unknown (05/29/2024)   Received from [facility]   Epic    Unable to Pay for Housing in the Last Year: Not on file    In the past 12 months, how many times have you moved where you were living?: 0    Homeless in the Last Year: Not on file  Utilities: Patient Declined (05/28/2024)   Received from [facility]    In the past 12 months has the electric, gas, oil, or water company threatened to shut off services in your home?: Patient declined  Health Literacy: Adequate Health Literacy (10/06/2023)   B1300 Health Literacy    Frequency of need for help with medical instructions: Never   Family History  Problem Relation Age of Onset   Other Paternal Grandfather        fell down stairs   Other Paternal Grandmother        fell down stairs   Alzheimer's disease Maternal Grandmother    Dementia Maternal Grandmother    Colon cancer Maternal Grandfather    Varicose Veins Father    Other Mother        back pain; nerve damage   Hypertension Mother    Arthritis Mother    Varicose Veins Mother    Hemophilia Son      Objective: Office vital signs reviewed. LMP 06/12/2016   Physical Examination:  General: Awake, alert, *** nourished, No acute distress HEENT: Normal    Neck: No masses palpated. No lymphadenopathy    Ears: Tympanic membranes intact, normal light reflex, no erythema, no bulging    Eyes: PERRLA, extraocular membranes intact, sclera ***    Nose: nasal turbinates  moist, *** nasal discharge    Throat: moist mucus membranes, no erythema, *** tonsillar exudate.  Airway is patent Cardio: regular rate and rhythm, S1S2 heard, no murmurs appreciated Pulm: clear to auscultation bilaterally, no wheezes, rhonchi or rales; normal work of breathing on room air GI: soft, non-tender, non-distended, bowel sounds present x4, no hepatomegaly, no splenomegaly, no masses GU: external vaginal tissue ***, cervix ***, *** punctate lesions on cervix appreciated, *** discharge from cervical os, *** bleeding, *** cervical motion tenderness, *** abdominal/ adnexal masses Extremities: warm, well perfused, No edema, cyanosis or clubbing; +*** pulses bilaterally MSK: *** gait and *** station Skin: dry; intact; no rashes or lesions Neuro: *** Strength and light touch sensation grossly intact, *** DTRs ***/4  Diabetic Foot Exam - Simple   No data filed     Lab Results  Component Value Date   HGBA1C 6.0 (H) 04/05/2024    Assessment/ Plan: 44 y.o. female   Insulin -requiring or dependent type II diabetes mellitus (HCC)  Hyperlipidemia associated with type 2 diabetes mellitus (HCC)  ***   Tami Laramie M Nikisha Fleece, DO [facility] [phone]     [1]  Allergies Allergen Reactions   Celebrex [Celecoxib]    Latex    Sulfonamide Derivatives    Metformin And Related Nausea Only   Ozempic (0.25 Or 0.5 Mg-Dose) [Semaglutide(0.25 Or 0.5mg -Dos)] Nausea And Vomiting   Victoza [Liraglutide] Nausea And Vomiting  [2]  Current Outpatient Medications:    Accu-Chek FastClix Lancets MISC,  Use to monitor blood glucose 3 time(s) daily, Disp: , Rfl:    ALPRAZolam  (XANAX ) 0.5 MG tablet, TAKE ONE TABLET DAILY AS NEEDED, Disp: 60 tablet, Rfl: 5   atorvastatin  (LIPITOR) 10 MG tablet, Take 1 tablet (10 mg total) by mouth daily., Disp: 90 tablet, Rfl: 3   Blood Glucose Monitoring Suppl (ACCU-CHEK GUIDE) w/Device KIT, 1 each by Does not applyoroute once for 1 dose. Use as directed by physician to monitor blood sugars, Disp: , Rfl:    busPIRone  (BUSPAR ) 15 MG tablet, Take 1 tablet (15 mg total) by mouth 3 (three) times daily. For anxiety, Disp: 270 tablet, Rfl: 3   Continuous Glucose Sensor (FREESTYLE LIBRE 3 PLUS SENSOR) MISC, Monitor sugar continuously, E11.9. Change sensor every 15 days., Disp: 6 each, Rfl: 4   diclofenac Sodium (VOLTAREN) 1 % GEL, APPLY 2 GRAMS TO AFFECTED JOINT(S) FOUR TIMES DAILY, Disp: , Rfl:    escitalopram  (LEXAPRO ) 20 MG tablet, TAKE ONE TABLET BY MOUTH DAILY, Disp: 30 tablet, Rfl: 11   gabapentin  (NEURONTIN ) 300 MG capsule, Take 300 mg by mouth 4 (four) times daily., Disp: , Rfl:    gabapentin  (NEURONTIN ) 600 MG tablet, Take 600 mg by mouth 3 (three) times daily., Disp: , Rfl:    insulin  glargine (LANTUS  SOLOSTAR) 100 UNIT/ML Solostar Pen, Inject 20-25 Units into the skin at bedtime. INJECT 25 UNITS INTO SKIN AT BEDTIME, Disp: 15 mL, Rfl: 4   Insulin  Pen Needle (BD PEN NEEDLE NANO U/F) 32G X 4 MM MISC, Use with insulin   E11.65, Disp: 100 each, Rfl: 3   magnesium oxide (MAG-OX) 400 MG tablet, Take 400 mg by mouth daily., Disp: , Rfl:    montelukast  (SINGULAIR ) 10 MG tablet, TAKE 1 TABLET AT BEDTIME FOR ALLERGIES, Disp: 90 tablet, Rfl: 3   nortriptyline (PAMELOR) 75 MG capsule, Take 75 mg by mouth daily., Disp: , Rfl:    pantoprazole  (PROTONIX ) 40 MG tablet, TAKE ONE TABLET BY MOUTH DAILY, Disp: 30 tablet, Rfl: 11  PAZEO 0.7 % SOLN, Apply 1 drop to eye every morning., Disp: , Rfl:    RESTASIS 0.05 % ophthalmic emulsion, 1 drop 2 (two) times daily., Disp: , Rfl:     sitaGLIPtin  (JANUVIA ) 100 MG tablet, Take 1 tablet (100 mg total) by mouth daily., Disp: 90 tablet, Rfl: 3   terbinafine  (LAMISIL ) 250 MG tablet, Take 1 tablet (250 mg total) by mouth daily., Disp: 90 tablet, Rfl: 0   tiZANidine (ZANAFLEX) 4 MG tablet, Take 4 mg by mouth 2 (two) times daily., Disp: , Rfl:    valACYclovir  (VALTREX ) 1000 MG tablet, TAKE ONE TABLET ONCE DAILY, Disp: 30 tablet, Rfl: 11  "

## 2024-10-22 ENCOUNTER — Other Ambulatory Visit: Payer: Self-pay | Admitting: Family Medicine

## 2024-10-22 DIAGNOSIS — E119 Type 2 diabetes mellitus without complications: Secondary | ICD-10-CM

## 2024-10-22 NOTE — Telephone Encounter (Signed)
 [facility] NTBS for 6 mos FU RF sent to pharmacy

## 2024-10-23 ENCOUNTER — Encounter: Payer: Self-pay | Admitting: Family Medicine

## 2024-10-23 NOTE — Telephone Encounter (Signed)
 LMTCB to schedule appt Letter mailed
